# Patient Record
Sex: Female | Born: 1935 | Race: White | Hispanic: No | Smoking: Never smoker
Health system: Southern US, Community
[De-identification: ages and names within clinical notes are randomized; demographics above are authoritative.]

## PROBLEM LIST (undated history)

## (undated) DIAGNOSIS — N6019 Diffuse cystic mastopathy of unspecified breast: Secondary | ICD-10-CM

## (undated) DIAGNOSIS — I255 Ischemic cardiomyopathy: Secondary | ICD-10-CM

## (undated) DIAGNOSIS — Z860100 Personal history of colon polyps, unspecified: Secondary | ICD-10-CM

## (undated) DIAGNOSIS — I10 Essential (primary) hypertension: Secondary | ICD-10-CM

## (undated) DIAGNOSIS — I219 Acute myocardial infarction, unspecified: Secondary | ICD-10-CM

## (undated) DIAGNOSIS — I2102 ST elevation (STEMI) myocardial infarction involving left anterior descending coronary artery: Secondary | ICD-10-CM

## (undated) DIAGNOSIS — I2510 Atherosclerotic heart disease of native coronary artery without angina pectoris: Secondary | ICD-10-CM

## (undated) HISTORY — DX: Diffuse cystic mastopathy of unspecified breast: N60.19

## (undated) HISTORY — DX: Personal history of colonic polyps: Z86.010

## (undated) HISTORY — DX: Essential (primary) hypertension: I10

## (undated) HISTORY — DX: Acute myocardial infarction, unspecified: I21.9

## (undated) HISTORY — PX: CATARACT EXTRACTION, BILATERAL: SHX1313

## (undated) HISTORY — DX: Atherosclerotic heart disease of native coronary artery without angina pectoris: I25.10

## (undated) HISTORY — DX: Personal history of colon polyps, unspecified: Z86.0100

## (undated) HISTORY — DX: Ischemic cardiomyopathy: I25.5

## (undated) HISTORY — DX: ST elevation (STEMI) myocardial infarction involving left anterior descending coronary artery: I21.02

---

## 1998-10-21 ENCOUNTER — Other Ambulatory Visit: Admission: RE | Admit: 1998-10-21 | Discharge: 1998-10-21 | Payer: Self-pay | Admitting: Obstetrics and Gynecology

## 1999-09-19 ENCOUNTER — Other Ambulatory Visit: Admission: RE | Admit: 1999-09-19 | Discharge: 1999-09-19 | Payer: Self-pay | Admitting: Obstetrics and Gynecology

## 1999-10-28 ENCOUNTER — Ambulatory Visit (HOSPITAL_COMMUNITY): Admission: RE | Admit: 1999-10-28 | Discharge: 1999-10-28 | Payer: Self-pay | Admitting: General Surgery

## 1999-10-28 ENCOUNTER — Encounter: Payer: Self-pay | Admitting: General Surgery

## 2000-09-19 ENCOUNTER — Ambulatory Visit (HOSPITAL_COMMUNITY): Admission: RE | Admit: 2000-09-19 | Discharge: 2000-09-19 | Payer: Self-pay | Admitting: Obstetrics and Gynecology

## 2000-09-19 ENCOUNTER — Other Ambulatory Visit: Admission: RE | Admit: 2000-09-19 | Discharge: 2000-09-19 | Payer: Self-pay | Admitting: Obstetrics and Gynecology

## 2000-09-19 ENCOUNTER — Encounter: Payer: Self-pay | Admitting: Obstetrics and Gynecology

## 2002-02-11 ENCOUNTER — Other Ambulatory Visit: Admission: RE | Admit: 2002-02-11 | Discharge: 2002-02-11 | Payer: Self-pay | Admitting: Obstetrics and Gynecology

## 2009-12-13 ENCOUNTER — Ambulatory Visit (HOSPITAL_COMMUNITY): Admission: RE | Admit: 2009-12-13 | Discharge: 2009-12-13 | Payer: Self-pay | Admitting: Ophthalmology

## 2010-12-09 ENCOUNTER — Emergency Department (HOSPITAL_COMMUNITY)
Admission: EM | Admit: 2010-12-09 | Discharge: 2010-12-09 | Payer: Self-pay | Source: Home / Self Care | Admitting: Emergency Medicine

## 2010-12-09 ENCOUNTER — Inpatient Hospital Stay (HOSPITAL_COMMUNITY)
Admission: EM | Admit: 2010-12-09 | Discharge: 2010-12-13 | Disposition: A | Discharge status: Home / Self Care | Payer: Self-pay | Attending: Cardiovascular Disease | Admitting: Cardiovascular Disease

## 2010-12-09 LAB — CBC
HCT: 41 % (ref 36.0–46.0)
Hemoglobin: 15.3 g/dL — ABNORMAL HIGH (ref 12.0–15.0)
MCH: 33 pg (ref 26.0–34.0)
MCHC: 37.3 g/dL — ABNORMAL HIGH (ref 30.0–36.0)
MCV: 88.6 fL (ref 78.0–100.0)
Platelets: 232 10*3/uL (ref 150–400)
RBC: 4.63 MIL/uL (ref 3.87–5.11)
RDW: 14.3 % (ref 11.5–15.5)
WBC: 9.7 10*3/uL (ref 4.0–10.5)

## 2010-12-09 LAB — COMPREHENSIVE METABOLIC PANEL
ALT: 16 U/L (ref 0–35)
AST: 45 U/L — ABNORMAL HIGH (ref 0–37)
Albumin: 4 g/dL (ref 3.5–5.2)
Alkaline Phosphatase: 76 U/L (ref 39–117)
BUN: 8 mg/dL (ref 6–23)
CO2: 25 mEq/L (ref 19–32)
Calcium: 9.2 mg/dL (ref 8.4–10.5)
Chloride: 96 mEq/L (ref 96–112)
Creatinine, Ser: 0.63 mg/dL (ref 0.4–1.2)
GFR calc Af Amer: >60 mL/min (ref >60)
GFR calc non Af Amer: >60 mL/min (ref >60)
Glucose, Bld: 138 mg/dL — ABNORMAL HIGH (ref 70–99)
Potassium: 3.6 mEq/L (ref 3.5–5.1)
Sodium: 134 mEq/L — ABNORMAL LOW (ref 135–145)
Total Bilirubin: 0.5 mg/dL (ref 0.3–1.2)
Total Protein: 7.3 g/dL (ref 6.0–8.3)

## 2010-12-09 LAB — CARDIAC PANEL(CRET KIN+CKTOT+MB+TROPI)
CK, MB: 132.4 ng/mL (ref 0.3–4.0)
CK, MB: 156.1 ng/mL (ref 0.3–4.0)
Relative Index: 17.1 — ABNORMAL HIGH (ref 0.0–2.5)
Total CK: 662 U/L — ABNORMAL HIGH (ref 7–177)
Troponin I: 33.71 ng/mL (ref 0.00–0.06)

## 2010-12-09 LAB — POCT CARDIAC MARKERS
CKMB, poc: 25.2 ng/mL (ref 1.0–8.0)
CKMB, poc: 38.3 ng/mL (ref 1.0–8.0)
Myoglobin, poc: 282 ng/mL (ref 12–200)
Myoglobin, poc: 287 ng/mL (ref 12–200)
Troponin i, poc: 0.93 ng/mL (ref 0.00–0.09)
Troponin i, poc: 0.95 ng/mL (ref 0.00–0.09)

## 2010-12-09 LAB — PROTIME-INR: INR: 1.01 (ref 0.00–1.49)

## 2010-12-09 LAB — APTT: aPTT: 60 seconds — ABNORMAL HIGH (ref 24–37)

## 2010-12-09 LAB — GLUCOSE, CAPILLARY

## 2010-12-10 LAB — URINALYSIS, ROUTINE W REFLEX MICROSCOPIC
Bilirubin Urine: NEGATIVE
Ketones, ur: NEGATIVE mg/dL
Nitrite: NEGATIVE
Protein, ur: NEGATIVE mg/dL
Specific Gravity, Urine: 1.017 (ref 1.005–1.030)
Urobilinogen, UA: 0.2 mg/dL (ref 0.0–1.0)

## 2010-12-10 LAB — CARDIAC PANEL(CRET KIN+CKTOT+MB+TROPI)
CK, MB: 97.4 ng/mL (ref 0.3–4.0)
Relative Index: 11.9 — ABNORMAL HIGH (ref 0.0–2.5)
Total CK: 820 U/L — ABNORMAL HIGH (ref 7–177)
Troponin I: 24.48 ng/mL (ref 0.00–0.06)

## 2010-12-10 LAB — BASIC METABOLIC PANEL
BUN: 5 mg/dL — ABNORMAL LOW (ref 6–23)
CO2: 23 mEq/L (ref 19–32)
Calcium: 8.1 mg/dL — ABNORMAL LOW (ref 8.4–10.5)
Creatinine, Ser: 0.68 mg/dL (ref 0.4–1.2)
GFR calc Af Amer: >60 mL/min (ref >60)

## 2010-12-10 LAB — CBC
Hemoglobin: 14.1 g/dL (ref 12.0–15.0)
MCH: 31.3 pg (ref 26.0–34.0)
MCHC: 35.4 g/dL (ref 30.0–36.0)
MCV: 88.4 fL (ref 78.0–100.0)

## 2010-12-10 LAB — LIPID PANEL
LDL Cholesterol: 151 mg/dL — ABNORMAL HIGH (ref 0–99)
VLDL: 14 mg/dL (ref 0–40)

## 2010-12-11 LAB — BASIC METABOLIC PANEL
BUN: 15 mg/dL (ref 6–23)
CO2: 23 mEq/L (ref 19–32)
Chloride: 96 mEq/L (ref 96–112)
Glucose, Bld: 109 mg/dL — ABNORMAL HIGH (ref 70–99)
Potassium: 3.8 mEq/L (ref 3.5–5.1)
Sodium: 128 mEq/L — ABNORMAL LOW (ref 135–145)

## 2010-12-12 LAB — BASIC METABOLIC PANEL
BUN: 11 mg/dL (ref 6–23)
CO2: 26 mEq/L (ref 19–32)
Chloride: 99 mEq/L (ref 96–112)
Creatinine, Ser: 0.74 mg/dL (ref 0.4–1.2)
Potassium: 4.2 mEq/L (ref 3.5–5.1)

## 2010-12-12 NOTE — H&P (Signed)
Debra Doyle, Debra Doyle                 ACCOUNT NO.:  [id number]  MEDICAL RECORD NO.:  [id number]          PATIENT TYPE:  INP  LOCATION:  2907                         FACILITY:  [facility]  PHYSICIAN:  [name], MDDATE OF BIRTH:  [birth year]  DATE OF ADMISSION:  12/09/2010 DATE OF DISCHARGE:                             HISTORY & PHYSICAL   PRIMARY CARDIOLOGIST:  Previously seen by Dr. Ellamae Sia.  PRIMARY CARE PHYSICIAN:  Erasmo Downer, MD in [place].  PATIENT PROFILE:  A 77 year old female without prior cardiac history presents on transfer from [facility] with non-ST elevation MI.  PROBLEM LIST: 1. Non-ST segment elevation myocardial function. 2. Hypertension. 3. History of fibrocystic breast disease affecting the left breast. 4. History of colon polyp. 5. Status post bilateral cataract surgeries.  ALLERGIES:  No known drug allergies.  HISTORY OF PRESENT ILLNESS:  A 77 year old female without prior cardiac history who reports having intermittent indigestion symptoms over the past few months, although never with exertion.  She exercises 30 minutes daily without limitations and says that she actually feels better usually with exercise.  Yesterday morning, she awoke with mild substernal chest pressure without associated symptoms lasting a few hours and resolving spontaneously.  She attributed this to reflux. Sometime after dinner, she had recurrence of chest pressure again without associated symptoms.  However, at this time, it was persistent throughout the night making her quite uncomfortable.  She presented to [facility] ED this morning where her ECG showed diffuse minimal J-point ST elevation, perhaps more pronounced in V2 and V3.  with poor R-wave progression and also small inferior Qs.  The patient was treated with aspirin, 600 mg of Plavix, heparin, and IV nitroglycerin with some initial relief of discomfort.  Currently, she says pain is nearly nonexistent.   Her troponin is 1.05 with a CK of 316, MB of 32.4.  She is transferred to [facility] for further evaluation.  HOME MEDICATIONS:  The patient takes multiple herbs and vitamins but no prescribed medicines.  FAMILY HISTORY:  Mother died with history of coronary artery disease, stroke, and diabetes.  Father died of emphysema.  She has a sister who died of cancer, another brother died of CVA.  SOCIAL HISTORY:  She lives in [place] with her husband.  She is retired.  She never smoked.  Denies alcohol or drugs.  She rides stationary bike 30 minutes daily.  She tried to adhere to low-fat diet, and uses herbal medications but she cannot say which one.  REVIEW OF SYSTEMS:  Positive for chest pain as outlined in the HPI.  She is postmenopausal.  Otherwise, all systems reviewed negative.  She is a full code.  PHYSICAL EXAMINATION:  VITAL SIGNS:  She is afebrile at a temperature of 98.1, heart rate 89, respirations 21, blood pressure 153/95, pulse ox 100% on room air. GENERAL:  Pleasant white female in no acute distress.  Awake, alert, and oriented x3. PSYCHIATRIC:  Normal affect. HEENT:  Normal. NEUROLOGIC:  Grossly intact, nonfocal. SKIN:  Warm and dry without lesions or masses. NECK:  Supple without bruits or JVD. LUNGS:  Respirations regular and unlabored  with few crackles at the bases, otherwise clear to auscultation. CARDIAC:  Regular S1 and S2.  No S3, S4, or murmurs. ABDOMEN:  Round, soft, nontender, nondistended. EXTREMITIES:  Warm, dry, and pink.  No clubbing, cyanosis, or edema. Dorsalis pedis and posterior tibial, and radial pulses are 2+ and equal bilaterally.  She has normal Allen test.  Chest x-ray shows no acute findings.  EKG shows sinus rhythm, rate 85. She has approximately 0.5 or less millimeter diffuse ST-segment elevation with small Qs inferiorly and poor R-wave progression.  She has approximately 2-mm J-point elevation in leads V2 and 1-mm in V3. Hemoglobin 15.3,  hematocrit 41, WBC 9.7, platelets 232.  Sodium 134, potassium 3.6, chloride 96, CO2 of 25, BUN 8, creatinine 0.6, glucose 138, total bilirubin 0.5, alkaline phosphatase 76, AST 45, ALT 16, total protein 7.3, albumin 4, calcium 9.2, CK 360, MB 32.4, troponin I 1.05.  ASSESSMENT AND PLAN: 1. Non-ST segment elevation myocardial infarction.  The patient     presents with 24-hour history of ongoing chest pressure without     associated symptoms.  She is currently feeling much better and is     more comfortable, although still says she has about a 1/10 chest     discomfort.  Plan to admit and cycle cardiac markers.  Continue     heparin, IV nitroglycerin.  She loaded with 600 mg of Plavix as     well as aspirin.  We will add beta-blocker and high-dose statin.     Plan catheterization later today or sooner if necessary.  Titrate     nitrates for chest pain. 2. Hypertension adding beta-blocker and currently on IV nitro.     Continue to follow.  Would have a low threshold, add ACE inhibitor     in the setting of acute coronary syndrome. 3. Lipid status currently unknown.  Check lipids and LFTs.  Add high-     dose statin.  Thank you for following this patient.     Nicolasa Ducking, ANP   ______________________________ [name], MD    CB/MEDQ  D:  12/09/2010  T:  12/10/2010  Job:  [id number]  Electronically Signed by Nicolasa Ducking ANP on 12/12/2010 12:42:12 PM Electronically Signed by [name] MD on 12/12/2010 02:45:01 PM

## 2010-12-12 NOTE — Procedures (Signed)
Debra Doyle, PULVER                 ACCOUNT NO.:  [id number]  MEDICAL RECORD NO.:  [id number]           PATIENT TYPE:  LOCATION:                                 FACILITY:  PHYSICIAN:  [name], MDDATE OF BIRTH:  [birth year]  DATE OF PROCEDURE:  12/09/2010 DATE OF DISCHARGE:                           CARDIAC CATHETERIZATION   PRIMARY CARE PHYSICIAN:  Erasmo Downer, MD  PROCEDURE PERFORMED: 1. Left heart catheterization. 2. Selective coronary angiography. 3. Left ventricular angiogram. 4. PTCA with placement of 4 overlapping drug-eluting stents extending     from the proximal to the distal left anterior descending artery. 5. Angio-Seal femoral artery closure device.  OPERATOR:  [name], MD  INDICATIONS:  This is a 77 year old Caucasian female with a history of hypertension who is very active, but had the onset of chest discomfort yesterday.  The patient presented to [facility] Emergency Room this morning and was transferred to [facility] for further evaluation.  She ruled in for myocardial infarction with serial cardiac enzymes.  Her troponin peaked at 5.53 prior to the planned catheterization this evening.  PROCEDURE IN DETAIL:  The patient was brought to the main cardiac catheterization laboratory after signing informed consent for the procedure.  The right groin was prepped and draped in sterile fashion. A 1% lidocaine was used for local anesthesia.  A 5-French sheath was inserted into the right femoral artery without difficulty.  Standard diagnostic catheters were used to perform selective coronary angiography.  A pigtail catheter was used to perform a left ventricular angiogram.  The patient tolerated the diagnostic portion of the procedure well.  The patient was found to have a subtotal occlusion of the mid left anterior descending artery with a very large thrombus burden throughout the mid and distal vessel.  We up sized her sheath  to a 6-French sheath.  We then selectively engaged the left main artery with 6-French XB LAD 3.5 guiding catheter.  The patient was given a bolus of Angiomax and drip was started.  When the ACT was greater than 200, we passed a cougar intracoronary wire down the length of the left anterior descending artery.  There was a large thrombus burden.  I initially tried to pass an export catheter down the vessel, but we could not pass this beyond the severe stenosis in the mid vessel.  I then passed a 2.5 x 20-mm balloon into the mid vessel and dilated this lesion.  With this, some flow was restored in the mid vessel.  However, the distal vessel was still occluded.  The balloon was inflated 3 additional times extending from the distal vessel back through the mid vessel.  At this point, there was still some remaining thrombus in the vessel.  We gave the patient a double bolus of Integrilin and started an Integrilin drip.  I felt that in order to adequately treat her vessel, we would have to stent the entire left anterior descending artery.  We started by placing a 2.5 x 20-mm promise element drug-eluting stent in the distal vessel.  We then placed an overlapping 2.5 x 28-mm promise element  drug-eluting stent.  In the mid segment of 3.0 x 28-mm promise drug-eluting stent was placed.  In the proximal segment extending back to the ostium, a 3.0 x 20-mm promise element drug-eluting stent was placed.  We postdilated the distal portion of the stented segment with a 2.5 x 20-mm noncompliant balloon in high inflation pressures.  We postdilated the mid and proximal segments with a 3.25 x 20-mm noncompliant balloon with high pressure inflations.  The patient tolerated the procedure well.  We had good flow into the apical portion of the vessel at conclusion of the case.  There was some disease in the apical left anterior descending artery as it wrapped around the apex. However, the vessel was very  small-caliber down there.  The diagonal branches were small in size and had ostial 70% lesions.  There was flow into both diagonal branches at the conclusion of the case.  The patient's Angiomax was stopped here in the cath lab.  Her Integrilin drip was continued.  We placed an Angio-Seal femoral artery closure device in the right femoral artery.  The patient had no complaints of chest pain at the time of leaving the cath lab.  Of note, the patient was loaded with Plavix 600 mg earlier this morning.  The patient was taken to the CCU in stable condition.  HEMODYNAMIC FINDINGS:  Central aortic pressure 126/76.  Left ventricular pressure 121/6.  Left ventricular end-diastolic pressure 14.  ANGIOGRAPHIC FINDINGS: 1. The left main coronary artery had 10% distal plaque. 2. The left anterior descending was large vessel that coursed to the     apex.  There was a 99% subtotal occlusion in the midportion of the     vessel.  The distal portion of the vessel fills slowly with initial     injections.  However, there appeared to be a large thrombus burden     throughout the distal vessel.  The proximal portion of the LAD had     a 60% narrowing.  The diagonal branches were small in caliber and     had ostial 70% lesions. 3. The circumflex artery gave off a large obtuse marginal branch with     diffuse 50% stenosis.  The AV groove circumflex had plaque disease. 4. The right coronary artery is a large dominant vessel with 30%     proximal plaque.  A discrete 60% mid stenosis, diffuse plaque     throughout the distal vessel, and diffuse plaque throughout the     posterolateral branch and posterior descending artery. 5. Left ventricular angiogram was performed in the RAO projection     showed akinesis of the anterior wall and apex with ejection     fraction of 35%.  IMPRESSION: 1. Non-ST-elevation myocardial infarction secondary to subtotally     occluded mid left anterior descending artery. 2.  Triple-vessel coronary artery disease. 3. Successful PTCA with placement of 4 overlapping drug-eluting stents     in the proximal mid and distal left anterior descending artery.  RECOMMENDATIONS:  The patient should be continued on aspirin and Plavix for the remainder of her life.  We will continue Integrilin for the next 18 hours.  We will stop her nitroglycerin drip now.  She will be given intravenous Lopressor for blood pressure control.  She will be watched closely in the CCU.  We will plan on getting an echocardiogram in the a.m.     [name], MD     CM/MEDQ  D:  12/09/2010  T:  12/10/2010  Job:  [id number]  cc:   Erasmo Downer, MD  Electronically Signed by [name] MD on 12/12/2010 10:30:18 AM

## 2010-12-13 LAB — BASIC METABOLIC PANEL
CO2: 27 mEq/L (ref 19–32)
Calcium: 8.7 mg/dL (ref 8.4–10.5)
Chloride: 102 mEq/L (ref 96–112)
Creatinine, Ser: 0.78 mg/dL (ref 0.4–1.2)
Glucose, Bld: 103 mg/dL — ABNORMAL HIGH (ref 70–99)

## 2010-12-23 NOTE — Discharge Summary (Signed)
Debra Doyle, Debra Doyle                 ACCOUNT NO.:  [id number]  MEDICAL RECORD NO.:  [id number]          PATIENT TYPE:  INP  LOCATION:  3707                         FACILITY:  [facility]  PHYSICIAN:  Debra Doyle, MDDATE OF BIRTH:  29-Jan-1929  DATE OF ADMISSION:  12/09/2010 DATE OF DISCHARGE:  12/13/2010                              DISCHARGE SUMMARY   PRIMARY CARDIOLOGIST:  Jonelle Sidle, MD (new).  PRIMARY CARE PHYSICIAN:  Erasmo Downer, MD  DISCHARGE DIAGNOSES: 1. Coronary artery disease/non-ST-elevation myocardial infarction.     a.     Cardiac catheterization December 09, 2010:  Non-ST-elevation      myocardial infarction secondary to subtotally occluded mid left      anterior descending artery.  Triple-vessel coronary artery      disease.  Successful percutaneous transluminal coronary      angioplasty with placement of 4 overlapping drug-eluting stents      (2.5 x 20 mm Promus Element, 2.5 x 28 mm Promus Element, 3.0 x 28      mm Promus Element, 3.0 x 20 mm Promus Element drug-eluting stents)      in the proximal, mid, and distal left anterior descending artery.     b.     Peak cardiac enzymes on second full set with CK 912, MB      156.1, and troponin-I of 33.71. 2. Hypotension.     a.     Systolic blood pressure nadir of 83 limiting a beta blockade      and ACE inhibitor therapy. 3. Hyponatremia.     a.     Sodium nadir of 127, resolved by December 12, 2010.  The      patient instructed not to restrict sodium intake. 4. Ischemic cardiomyopathy, left ventricular ejection fraction 40-45%.     a.     2-D echocardiogram, December 11, 2010:  Left ventricular      ejection fraction 40-45% with hypokinesis to akinesis of the      mid/distal inferior, distal posterior, and mid distal septal      walls.  Hypokinesis/akinesis of the distal anterior, akinesis at      the apex.  Left ventricular function is vigorous proximally.  Wall      thickness with increasing pattern of  moderate left ventricular      hypertrophy, grade 1 diastolic dysfunction. 5. Hyperlipidemia.     a.     Total cholesterol 215, triglycerides 69, HDL 50, and LDL      151. 6. Hypertension.     a.     Peak blood pressure of 170/102, subsequently hypotensive      during the rest of hospital course.  SECONDARY DIAGNOSES: 1. History of fibrocystic breast disease affecting the left breast. 2. History of colonic polyps. 3. Status post  bilateral cataract surgeries.  ALLERGIES:  NKDA.  PROCEDURES: 1. Chest x-ray, December 09, 2010:  No active cardiopulmonary disease. 2. EKG, December 10, 2010:  Normal sinus rhythm, 85 bpm, approximately     0.5 or less millimeters of diffuse ST-segment elevation with small     Q-waves inferiorly and poor  R-wave progression.  Approximately 2 mm     J-point elevation in leads V2 and 1 mm in V3. 3. 2-D echocardiogram, December 11, 2010:  Please see discharge     diagnoses section under ischemic cardiomyopathy. 4. Cardiac catheterization, December 09, 2010:  Left main 10% distal     plaque.  Left anterior descending is a large vessel coursing to the     apex with 99% subtotal occlusion in the midportion, distal portion     fills slowly with initial injections.  Appeared to be a large     thrombus burden throughout the distal vessel.  Proximal portion of     left anterior descending had 60% narrowing.  Diagonal branches were     small in caliber with 70% ostial lesions.  Circumflex artery gave     off large obtuse marginal with diffuse 50% stenosis.     Atrioventricular circumflex had plaque disease.  Right coronary     artery is a large dominant vessel with 30% proximal plaque.  60%     discrete mid stenosis, diffuse plaque throughout the distal vessel     and diffuse plaque throughout the posterolateral branch and     posterior descending artery.  Left ventricular ejection fraction     35%.  Otherwise, see discharge diagnoses #1 under coronary artery      disease subsection A. 5. EKG, December 12, 2010:  Normal sinus rhythm, 86 bpm, biphasic T-     waves in V1-V6 as well as in inferior leads with minimal T-wave     inversion in I and aVL.  No clearly significant Q-waves with poor R-     wave progression, normal axis, PR 152, QRS in 98, and QTc of 476.  HISTORY OF PRESENT ILLNESS:  Debra Doyle is a 77 year old Caucasian female with no known cardiac history and no other risk factors other than noted who presented to [facility] complaining of symptoms consistent with GERD that were unrelenting over the course of the evening and prompted her to seek further evaluation.  Her EKG showed diffuse minimal J-point elevation, more pronounced in V2 and V3 with poor R-wave progression and her cardiac enzymes were noted to be up with a troponin of 1.05 on first set of enzymes documented.  The patient was treated with 600 mg Plavix load, IV heparin, IV nitro drip with improvement in her symptoms and subsequently transferred to [facility] for further evaluation.  HOSPITAL COURSE:  The patient was admitted and due to her concerning EKG changes and in spite of her minimal symptoms, it was thought best to proceed with urgent cardiac catheterization.  She was subsequently found to have significant triple-vessel coronary artery disease, most severe in the mid left anterior descending artery with 99% subtotal occlusion. She had decreased LVEF of 35% on that study as well.  She was treated with PTCA and subsequent PCI with drug-eluting stents x4 to the left anterior descending artery.  She initially was hypertensive with a peak blood pressure of 170/102 and was treated with p.r.n. hydralazine along with low-dose beta-blockade and ACE inhibitor therapy.  She subsequently became hypotensive with a systolic blood pressure nadir of 83. Secondary to this, her beta-blockade therapy and ACE inhibitor therapy were not up-titrated and she slowly increased her  blood pressures to within normal limits which had been stable for 24 hours at the time of her discharge.  She worked with Cardiac Rehab Phase I on December 12, 2010 ambulating 500 feet  with no symptoms nor any need for assistance. She was kept overnight for observation and again was asymptomatic on the morning of December 13, 2010.  She was seen by attending cardiologist, Dr. Verne Doyle and deemed stable for discharge.  Please see discharge meds in the discharge med section.  At the time of her discharge, the patient received her new medication list, prescriptions, followup instructions, and postcath instructions.  She has been set up for a followup at [facility] in [place] office as she lives in [place] with Dr. Nona Dell on December 29, 2010 at 3:20 p.m. It is also noteworthy that the patient had some mild hyponatremia with a sodium nadir of 127 which resolved with minimal increase in her p.o. salt intake and no other interventions.  Her sodium on date of discharge was 139.  The patient was instructed to not restrict sodium on an outpatient basis but to increase for fruits and vegetables as part of her heart-healthy diet going forward.  She has been set up for Cardiac Rehab Phase II by cardiac rehab inpatient.  DISCHARGE LABORATORY DATA:  WBC 7.8, HGB 14.1, HCT 39.8, and PLT count is 196.  Pro-time 13.5 and INR 1.01.  Sodium 139, potassium 4.1, chloride 102, bicarb 27, BUN 10, and creatinine 0.78.  Liver function tests on December 09, 2010 were within normal limits except for AST at 45.  Hemoglobin A1c was 5.3%.  Initial set of point-of-care markers positive with a troponin of 1.05.  Peak cardiac enzymes on the second full set with CK 912, MB 156.1, and troponin-I of 33.71 down trending on third set.  Total cholesterol 215, triglycerides 69, HDL 50, LDL 151, and total cholesterol/HDL ratio 4.3.  TSH 1.309.  Urinalysis within normal limits except for a trace  amount of blood.  FOLLOWUP PLANS AND APPOINTMENTS:  Please see hospital course.  DISCHARGE MEDICATIONS: 1. Atorvastatin 80 mg 1 tablet p.o. at bedtime. 2. Acetaminophen 325 mg 1-2 tablets p.o. q.4 h. p.r.n. 3. Enteric-coated aspirin 325 mg 1 tablet p.o. daily. 4. Carvedilol 3.125 mg p.o. b.i.d. 5. Clopidogrel 75 mg 1 tablet p.o. daily with meal. 6. Lisinopril 2.5 mg 1 tablet p.o. daily. 7. Sublingual nitroglycerin 0.4 mg q.5 minutes up to 3 doses p.r.n.     for chest discomfort. 8. Bilberry 1 tablet p.o. daily. 9. Calcium carbonate 1 tablet p.o. t.i.d. 10.Cinnamon 1 tablet p.o. daily. 11.DIM 1 tablet p.o. daily. 12.Fish oil 1 tablet p.o. at bedtime. 13.Flaxseed oil 1 tablespoon p.o. daily. 14.Glucosamine 1 tablet p.o. b.i.d. 15.Magnesium oxide 1 tablet p.o. b.i.d. 16.Multivitamin 1 tablet p.o. b.i.d. 17.Vitamin C 500 mg p.o. daily.  DURATION OF DISCHARGE ENCOUNTER INCLUDING PHYSICIAN TIME:  35 minutes.     Jarrett Ables, PAC   ______________________________ Debra Carrow, MD    MS/MEDQ  D:  12/13/2010  T:  12/14/2010  Job:  [id number]  cc:   Jonelle Sidle, MD Erasmo Downer, MD  Electronically Signed by Jarrett Ables PAC on 12/21/2010 03:00:15 PM Electronically Signed by Debra Carrow MD on 12/23/2010 12:53:35 PM

## 2010-12-29 ENCOUNTER — Ambulatory Visit (INDEPENDENT_AMBULATORY_CARE_PROVIDER_SITE_OTHER): Payer: MEDICARE | Admitting: Cardiology

## 2010-12-29 ENCOUNTER — Encounter: Payer: Self-pay | Admitting: Cardiology

## 2010-12-29 DIAGNOSIS — E782 Mixed hyperlipidemia: Secondary | ICD-10-CM | POA: Insufficient documentation

## 2010-12-29 DIAGNOSIS — I10 Essential (primary) hypertension: Secondary | ICD-10-CM | POA: Insufficient documentation

## 2010-12-29 DIAGNOSIS — I2510 Atherosclerotic heart disease of native coronary artery without angina pectoris: Secondary | ICD-10-CM | POA: Insufficient documentation

## 2010-12-29 DIAGNOSIS — I214 Non-ST elevation (NSTEMI) myocardial infarction: Secondary | ICD-10-CM | POA: Insufficient documentation

## 2010-12-30 ENCOUNTER — Encounter: Payer: Self-pay | Admitting: Cardiology

## 2011-01-04 NOTE — Letter (Signed)
Summary: [name], [employer] at [employer]  [address], [place] [address]   Phone: [phone]  Fax: [phone]     December 29, 2010 MRN: [id number]   Debra Doyle [address] [place], [place]  [address]      YOUR LAB WORK IS DUE  February 06, 2011 _________________________________________  Please go to [facility], located across the street from [facility] on the second floor.  Hours are Monday - Friday 7am until 7:30pm         Saturday 8am until 12noon    _X_  DO NOT EAT OR DRINK AFTER MIDNIGHT EVENING PRIOR TO LABWORK  __ YOUR LABWORK IS NOT FASTING --YOU MAY EAT PRIOR TO LABWORK

## 2011-01-04 NOTE — Assessment & Plan Note (Signed)
Summary: [facility] 4 STENTS   Visit Type:  Follow-up Primary Provider:  Dr. Lia Hopping   History of Present Illness: 76 year old woman presents for followup. This is my first meeting with her. She presented with non-ST elevation myocardial infarction in January, and underwent percutaneous intervention to the LAD by Dr. Worthy Keeler as noted below. She has evidence of an ischemic cardiomyopathy by followup echocardiogram.   She states that she is "tired" although continues to exercise at [employer], and plans to join cardiac rehabilitation. She is not reporting any angina. She does describe a dry cough, questioning whether it could be related to her medications.  Labs of note during hospital stay included cholesterol 215, triglycerides 69, HDL 50, LDL 151, peak troponin I of 33, BUN 10, creatinine 0.7. During her stay medical therapy was limited by transient hypotension, and she also developed transient hyponatremia. Sodium was 139 by discharge.  In speaking with the patient, and particularly her husband, it is quite clear that they adhere to alternative views of health and health care. Her husband in fact brought in books and other paraphernalia to support this. While I did certainly endorse the positive effects of diet and exercise, and also was fairly clear in explaining the indications for her medical therapy, and in particular the critical importance of maintaining dual antiplatelet therapy in light of her drug eluding stents.  Blood pressure readings from home were presented showing systolics generally ranging from 115 to 153.  Current Medications (verified): 1)  Atorvastatin Calcium 40 Mg Tabs (Atorvastatin Calcium) .... Take 1 Tablet By Mouth At Bedtime 2)  Tylenol 325 Mg Tabs (Acetaminophen) .... Take As Needed 3)  Aspirin 325 Mg Tabs (Aspirin) .... Take 1 Tab Daily 4)  Carvedilol 3.125 Mg Tabs (Carvedilol) .... Take 1 Tab Two Times A Day 5)  Plavix 75 Mg Tabs (Clopidogrel Bisulfate) .... Take 1  Tab Daily 6)  Cozaar 25 Mg Tabs (Losartan Potassium) .... Take 1 Tablet By Mouth Once Daily 7)  Nitrostat 0.4 Mg Subl (Nitroglycerin) .... Use As Directed For Chest Pain 8)  Bilberry 100 Mg Caps (Bilberry (Vaccinium Myrtillus)) .... Take 1 Tab Daily 9)  Calcarb 600 1500 Mg Tabs (Calcium Carbonate) .... Take 1 Tab Three Times A Day 10)  Cinnamon 500 Mg Caps (Cinnamon) .... Take 1 Tab Daily 11)  Dramamine 50 Mg Tabs (Dimenhydrinate) .... Take 1 Tab Daily 12)  Fish Oil 1000 Mg Caps (Omega-3 Fatty Acids) .... Take 1 Tab Daily 13)  Flax Seed Oil 1000 Mg Caps (Flaxseed (Linseed)) 14)  Glucosamine 500 Mg Caps (Glucosamine Sulfate) .... Take 1 Tab Two Times A Day 15)  Magnesium 300 Mg Caps (Magnesium) .... Take 1 Tab Two Times A Day 16)  Daily Multi  Tabs (Multiple Vitamins-Minerals) .... Take 1 Tab Daily 17)  Vitamin C Cr 500 Mg Cr-Caps (Ascorbic Acid) .... Take 1 Tab Daily  Allergies (verified): No Known Drug Allergies  Comments:  Nurse/Medical Assistant: pt brought med list and her husband and i went over meds [facility]  Past History:  Family History: Last updated: 01/25/2011 Father: deceased due to emphysemia Mother: deceased due to coronary artery disease 1 sister deceased due to cancer type not noted 1 brother deceased due to cva  Social History: Last updated: 2011-01-25 Retired  Married  Tobacco Use - No.  Alcohol Use - no Regular Exercise - yes  Past Medical History: CAD - DES x 4 to LAD 1/12, LVEF 40-45% Hypertension Myocardial Infarction - NSTEMI 1/12 Fibrocystic breast disease  Colonic polyps  Past Surgical History: Bilaterial cataract surgery  Family History: Father: deceased due to emphysemia Mother: deceased due to coronary artery disease 1 sister deceased due to cancer type not noted 1 brother deceased due to cva  Social History: Retired  Married  Tobacco Use - No.  Alcohol Use - no Regular Exercise - yes  Review of Systems  The patient denies  anorexia, fever, chest pain, syncope, dyspnea on exertion, peripheral edema, hemoptysis, melena, and hematochezia.         Otherwise reviewed and negative except as outlined.  Vital Signs:  Patient profile:   77 year old female Height:      64 inches Weight:      170 pounds BMI:     29.29 Pulse rate:   86 / minute BP sitting:   142 / 85  (left arm)  Vitals Entered By: Dreama Saa, CNA (December 29, 2010 3:22 PM)  Physical Exam  Additional Exam:  Overweight woman in no acute distress. HEENT: Conjunctiva and lids normal, oropharynx with moist mucosa. Neck: Supple, no elevated JVP or carotid bruits. Lungs: Clear to auscultation, nontender. Cardiac: Regular rate and rhythm, no S3 or pericardial rub. Abdomen: Soft, nontender, bowel sounds present. Skin: Warm and dry. Extremities: No pitting edema, distal pulses full. Skin: Warm and dry, no ecchymosis. Musculoskeletal: No kyphosis. Neuropsychiatric: Alert and oriented x3, affect appropriate.   Cardiac Cath  Procedure date:  12/12/2010  Findings:      ANGIOGRAPHIC FINDINGS:   1. The left main coronary artery had 10% distal plaque.   2. The left anterior descending was large vessel that coursed to the       apex.  There was a 99% subtotal occlusion in the midportion of the       vessel.  The distal portion of the vessel fills slowly with initial       injections.  However, there appeared to be a large thrombus burden       throughout the distal vessel.  The proximal portion of the LAD had       a 60% narrowing.  The diagonal branches were small in caliber and       had ostial 70% lesions.   3. The circumflex artery gave off a large obtuse marginal branch with       diffuse 50% stenosis.  The AV groove circumflex had plaque disease.   4. The right coronary artery is a large dominant vessel with 30%       proximal plaque.  A discrete 60% mid stenosis, diffuse plaque       throughout the distal vessel, and diffuse plaque  throughout the       posterolateral branch and posterior descending artery.   5. Left ventricular angiogram was performed in the RAO projection       showed akinesis of the anterior wall and apex with ejection       fraction of 35%.  Echocardiogram  Procedure date:  12/11/2010  Findings:      Study Conclusions   Left ventricle: LVEF is approximately 40 to 45% with hypokinesis to   akinesis of the mid/distal inferior,distal posterior, and mid;   distal septal walls; hypokinesis/akinesis of the distal anterior ;   akinesis at the apex. LV function is vigorous proximally Wall   thickness was increased in a pattern of moderate LVH. Doppler   parameters are consistent with abnormal left ventricular relaxation   (grade 1 diastolic dysfunction).  EKG  Procedure date:  12/29/2010  Findings:      Sinus rhythm at 76 beats per minute with evidence of recent anterolateral infarct, possibly also inferoapical with evolutionary changes.  Impression & Recommendations:  Problem # 1:  ACUT MI SUBENDOCARDIAL INFARCT SUBSQT EPIS CARE (ICD-410.72)  No active chest pain. Plans to investigate cardiac rehabilitation in [place]. As noted above, I discussed the importance and indications of medical therapy. Patient and her husband seem to endorse more of an alternative view however. Her general preference is to reduce and/or discontinue medications over time. I have explained the critical importance of dual antiplatelet therapy in light of her drug-eluting stent placement, and she says that she will comply with this. I hope that we can work together in managing her cardiac condition over time. Six-week visit is scheduled for followup.  Her updated medication list for this problem includes:    Aspirin 325 Mg Tabs (Aspirin) .Marland Kitchen... Take 1 tab daily    Carvedilol 3.125 Mg Tabs (Carvedilol) .Marland Kitchen... Take 1 tab two times a day    Plavix 75 Mg Tabs (Clopidogrel bisulfate) .Marland Kitchen... Take 1 tab daily    Nitrostat 0.4 Mg Subl  (Nitroglycerin) ..... Use as directed for chest pain  Problem # 2:  CORONARY ATHEROSCLEROSIS NATIVE CORONARY ARTERY (ICD-414.01)  Cardiac catheterization review above, status post placement of 4 overlapping drug-eluting stents in the left anterior descending by Dr. Sanjuana Kava.  Her updated medication list for this problem includes:    Aspirin 325 Mg Tabs (Aspirin) .Marland Kitchen... Take 1 tab daily    Carvedilol 3.125 Mg Tabs (Carvedilol) .Marland Kitchen... Take 1 tab two times a day    Plavix 75 Mg Tabs (Clopidogrel bisulfate) .Marland Kitchen... Take 1 tab daily    Nitrostat 0.4 Mg Subl (Nitroglycerin) ..... Use as directed for chest pain  Problem # 3:  CARDIOMYOPATHY, ISCHEMIC (ICD-414.8)  LVEF 40-45%, hopefully some degree of stunning rather than scar. Continuing medical therapy is indicated. Patient does indicate a dry cough, possibly related to lisinopril which is a new medication. We will switch to Cozaar.  Her updated medication list for this problem includes:    Aspirin 325 Mg Tabs (Aspirin) .Marland Kitchen... Take 1 tab daily    Carvedilol 3.125 Mg Tabs (Carvedilol) .Marland Kitchen... Take 1 tab two times a day    Plavix 75 Mg Tabs (Clopidogrel bisulfate) .Marland Kitchen... Take 1 tab daily    Cozaar 25 Mg Tabs (Losartan potassium) .Marland Kitchen... Take 1 tablet by mouth once daily    Nitrostat 0.4 Mg Subl (Nitroglycerin) ..... Use as directed for chest pain  Problem # 4:  HYPERLIPIDEMIA (ICD-272.4)  Agreed to reduce dose of Lipitor 40 mg daily. Followup fasting lipid profile with liver function tests around the time of her next visit. I still plan to aim for aggressive LDL control.  Her updated medication list for this problem includes:    Atorvastatin Calcium 40 Mg Tabs (Atorvastatin calcium) .Marland Kitchen... Take 1 tablet by mouth at bedtime  Future Orders: T-Lipid Profile ([id number]) ... 02/06/2011 T-Hepatic Function [id number]) ... 02/06/2011  Problem # 5:  ESSENTIAL HYPERTENSION, BENIGN (ICD-401.1)  Blood pressure needs to be followed. They will continue to  check it at home.  Her updated medication list for this problem includes:    Aspirin 325 Mg Tabs (Aspirin) .Marland Kitchen... Take 1 tab daily    Carvedilol 3.125 Mg Tabs (Carvedilol) .Marland Kitchen... Take 1 tab two times a day    Cozaar 25 Mg Tabs (Losartan potassium) .Marland Kitchen... Take 1 tablet by mouth once daily  Patient Instructions:  1)  Your physician recommends that you schedule a follow-up appointment in: 6 weeks 2)  Your physician recommends that you return for lab work in: 6 weeks, just before next office visit 3)  Your physician has recommended you make the following change in your medication: decrease Lipitor to 40mg  and change Lisinopril to Cozaar 25mg  by mouth once daily  Prescriptions: COZAAR 25 MG TABS (LOSARTAN POTASSIUM) take 1 tablet by mouth once daily  #30 x 3   Entered by:   Larita Fife Via LPN   Authorized by:   Loreli Slot, MD, [facility]   Signed by:   Larita Fife Via LPN on 16/08/9603   Method used:   Electronically to        [facility]  [address] [address] [id number] (retail)       [address]       [place], [place]  [address]       Ph: [phone] or [phone]       Fax: [phone]   RxID:   [id number]

## 2011-02-06 ENCOUNTER — Other Ambulatory Visit: Payer: Self-pay | Admitting: Cardiology

## 2011-02-06 LAB — HEPATIC FUNCTION PANEL
Albumin: 4.4 g/dL (ref 3.5–5.2)
Alkaline Phosphatase: 86 U/L (ref 39–117)
Total Bilirubin: 0.5 mg/dL (ref 0.3–1.2)
Total Protein: 7.5 g/dL (ref 6.0–8.3)

## 2011-02-06 LAB — LIPID PANEL
HDL: 47 mg/dL (ref >39)
LDL Cholesterol: 165 mg/dL — ABNORMAL HIGH (ref 0–99)
Triglycerides: 176 mg/dL — ABNORMAL HIGH (ref <150)
VLDL: 35 mg/dL (ref 0–40)

## 2011-02-09 ENCOUNTER — Encounter: Payer: Self-pay | Admitting: *Deleted

## 2011-02-09 ENCOUNTER — Encounter: Payer: Self-pay | Admitting: Cardiology

## 2011-02-10 ENCOUNTER — Ambulatory Visit (INDEPENDENT_AMBULATORY_CARE_PROVIDER_SITE_OTHER): Payer: MEDICARE | Admitting: Cardiology

## 2011-02-10 ENCOUNTER — Encounter: Payer: Self-pay | Admitting: Cardiology

## 2011-02-10 VITALS — BP 154/104 | HR 92 | Ht 63.0 in | Wt 170.0 lb

## 2011-02-10 DIAGNOSIS — I2510 Atherosclerotic heart disease of native coronary artery without angina pectoris: Secondary | ICD-10-CM

## 2011-02-10 DIAGNOSIS — I214 Non-ST elevation (NSTEMI) myocardial infarction: Secondary | ICD-10-CM

## 2011-02-10 DIAGNOSIS — I2589 Other forms of chronic ischemic heart disease: Secondary | ICD-10-CM

## 2011-02-10 DIAGNOSIS — I429 Cardiomyopathy, unspecified: Secondary | ICD-10-CM

## 2011-02-10 DIAGNOSIS — Z79899 Other long term (current) drug therapy: Secondary | ICD-10-CM

## 2011-02-10 DIAGNOSIS — I10 Essential (primary) hypertension: Secondary | ICD-10-CM

## 2011-02-10 DIAGNOSIS — E782 Mixed hyperlipidemia: Secondary | ICD-10-CM

## 2011-02-10 MED ORDER — LOSARTAN POTASSIUM 50 MG PO TABS: 50.0000 mg | ORAL_TABLET | Freq: Every day | ORAL | Status: DC

## 2011-02-10 NOTE — Assessment & Plan Note (Addendum)
No reported angina at this point. Patient has multivessel CAD based on cardiac catheterization from January, in the setting of an acute coronary syndrome with 99% subtotal occlusion of the left anterior descending at the midportion. 50% circumflex and 60% RCA stenoses also noted with other mild diffuse disease. I continue to recommend aggressive intervention with medical therapy, also diet and exercise. At least at this point, the patient indicates that she wishes to continue to see me for followup.

## 2011-02-10 NOTE — Assessment & Plan Note (Signed)
Copies of original lipid profile during hospitalization with followup numbers were provided today. I made it clear that her LDL was not at goal given her situation. I did encourage them to continue with efforts at diet and exercise, although additionally indicated that it is likely she would need higher dose Lipitor, perhaps a change in statin therapy to get her LDL closer to 70. They (husband in particular) were not comfortable with making any changes at this time, and therefore she will continue on Lipitor 40 mg daily at least at this point.

## 2011-02-10 NOTE — Assessment & Plan Note (Signed)
Status post placement of four drug-eluting stents to the left anterior descending in January by Dr. Clifton James in the setting of a non-ST elevation myocardial infarction. Peak troponin I level was 33. I continue to underscore the critical importance of continuing dual antiplatelet therapy. Patient states that she is compliant. No reported bleeding problems.

## 2011-02-10 NOTE — Assessment & Plan Note (Signed)
Blood pressure does not appear to be well controlled. While she could have some degree of white coat hypertension, reporting that blood pressure is lower at other times, I still think that advancing medical therapy makes the most sense, particularly in light of her CAD with evidence of cardiomyopathy. I have recommended increasing Cozaar to 50 mg daily. We will continue to review her medications as long as she follows with [place].

## 2011-02-10 NOTE — Patient Instructions (Addendum)
Increase Cozaar to 50mg  by mouth once daily. You may take 2 of your 25 mg tablets until gone and then get new prescription filled for 50mg  tablets. Your physician recommends that you go to the [facility] for lab work: [facility]-DO LAB IN 2 WEEKS. Follow up as scheduled in 6 weeks.

## 2011-02-10 NOTE — Progress Notes (Signed)
Clinical Summary [name] is a 77 y.o.female recently established with me at an office visit in [place] back in February. History is reviewed below. She is now transitioning her care to the [place] office.  At our initial visit, it was clear that the patient and her husband adhered to fairly alternative views of health care and management strategies for illness. We discussed these viewpoints, and also her clear indications for more traditional medical therapy in light of drug-eluting stent placement and her recent acute coronary syndrome with evidence of LV dysfunction. I reviewed this again today.  We did change her from ACE inhibitor therapy to Cozaar related to dry cough at last visit. She reports no major change as yet. One wonders if it is clearly related. I outlined her active indications for ACE inhibitor or ARB treatment.  She denies any angina, reports that she is exercising 4-5 days a week at [employer], but was not interested in cardiac rehabilitation.  Followup lab work from 26 March showed total cholesterol 247, triglycerides 176, HDL 47, LDL 165, AST 23, ALT 12. I discussed these results in comparison to the prior lipid profile around the time of her hospitalization. She reports compliance with Lipitor.  We discussed options for medication adjustments in light of her blood pressure, need for afterload reduction, better control of lipids, recommendations for exercise. We also discussed plan to followup an echocardiogram eventually for reassessment of LVEF. The patient and her husband still seem fairly resistant to considering the possibility of long term medications, particularly at possibly higher doses.  The patient's husband was actually confrontational today and insulting to me personally, with use of vulgarities. He tells me that they plan to visit with Debra Doyle for his management opinion, and state that she used to see him years ago. I suggested that they should consider finding  another cardiologist if they were not comfortable with my recommendations - an option that the husband seemed to enbrace with vigor - however the patient herself stated that she wanted to continue to see me for evaluation at this point. I am not convinced however that a reasonable physician-patient relationship will be able to continue longterm at this point.  No Known Allergies  Current outpatient prescriptions:acetaminophen (TYLENOL) 325 MG tablet, Take 650 mg by mouth every 6 (six) hours as needed.  , Disp: , Rfl: ;  Ascorbic Acid (VITAMIN C) 500 MG tablet, Take 500 mg by mouth daily.  , Disp: , Rfl: ;  aspirin 325 MG tablet, Take 325 mg by mouth daily.  , Disp: , Rfl: ;  atorvastatin (LIPITOR) 40 MG tablet, Take 40 mg by mouth daily.  , Disp: , Rfl: ;  Bilberry 100 MG CAPS, Take by mouth daily.  , Disp: , Rfl:  Calcium Carbonate (CALCARB 600) 1500 MG TABS, Take by mouth 3 (three) times daily.  , Disp: , Rfl: ;  carvedilol (COREG) 3.125 MG tablet, Take 3.125 mg by mouth 2 (two) times daily with a meal.  , Disp: , Rfl: ;  Cinnamon 500 MG capsule, Take 500 mg by mouth daily.  , Disp: , Rfl: ;  clopidogrel (PLAVIX) 75 MG tablet, Take 75 mg by mouth daily.  , Disp: , Rfl: ;  Flaxseed, Linseed, (FLAXSEED OIL) 1000 MG CAPS, Take by mouth daily.  , Disp: , Rfl:  Glucosamine 500 MG CAPS, Take by mouth 2 (two) times daily.  , Disp: , Rfl: ;  losartan (COZAAR) 50 MG tablet, Take 1 tablet (50 mg total)  by mouth daily., Disp: 30 tablet, Rfl: 6;  Magnesium 300 MG CAPS, Take by mouth 2 (two) times daily.  , Disp: , Rfl: ;  nitroGLYCERIN (NITROSTAT) 0.4 MG SL tablet, Place 0.4 mg under the tongue every 5 (five) minutes as needed.  , Disp: , Rfl:  Omega-3 Fatty Acids (FISH OIL) 1000 MG CAPS, Take by mouth daily.  , Disp: , Rfl: ;  dimenhyDRINATE (DRAMAMINE) 50 MG tablet, Take 50 mg by mouth daily.  , Disp: , Rfl: ;  Multiple Vitamin (MULTIVITAMIN) tablet, Take 1 tablet by mouth daily.  , Disp: , Rfl: ;  DISCONTD: losartan  (COZAAR) 25 MG tablet, Take 25 mg by mouth daily.  , Disp: , Rfl:  DISCONTD: losartan (COZAAR) 50 MG tablet, Take 1 tablet (50 mg total) by mouth daily., Disp: 30 tablet, Rfl: 6;  DISCONTD: losartan (COZAAR) 50 MG tablet, Take 1 tablet (50 mg total) by mouth daily., Disp: 30 tablet, Rfl: 6  Past Medical History  Diagnosis Date  . Coronary atherosclerosis of native coronary artery     DES x 4  to LAD 1/12, LVEF 40-45%  . Essential hypertension, benign   . Myocardial infarction     NSTEMI 1/12  . Fibrocystic breast disease   . Hx of colonic polyps     Social History [name] reports that she has never smoked. She has never used smokeless tobacco. [name] reports that she does not drink alcohol.  Review of Systems No fevers, chills, or unusual weight change. Some fatigue. No chest pain, progressive shortness of breath, cough, hemoptysis, or wheezing. No palpitations, dizziness, or syncope. No dysphasia or odynophagia. Stable appetite with no abdominal pain, melena, or hematochezia. No orthopnea, PND, or lower extremity edema. No focal motor weakness, memory problems, or speech deficits. Otherwise systems reviewed and negative except as already outlined.   Physical Examination Filed Vitals:   02/10/11 1414  BP: 154/104  Pulse: 92  Overweight woman in no acute distress. HEENT: Conjunctiva and lids normal, oropharynx with moist mucosa. Neck: Supple, no elevated JVP or carotid bruits. Lungs: Clear to auscultation, nontender. Cardiac: Regular rate and rhythm, no S3 or pericardial rub. Abdomen: Soft, nontender, bowel sounds present. Skin: Warm and dry. Extremities: No pitting edema, distal pulses full. Skin: Warm and dry, no ecchymosis. Musculoskeletal: No kyphosis. Neuropsychiatric: Alert and oriented x3, affect appropriate.    Problem List and Plan

## 2011-02-10 NOTE — Assessment & Plan Note (Signed)
Presumably ischemic cardiomyopathy, although there may have been a component of myocardial stunning at the time of her acute coronary event. Medical therapy should continue, and I again recommend a followup echocardiogram. Perhaps they will agree to further evaluation at next visit.

## 2011-03-24 ENCOUNTER — Ambulatory Visit: Payer: MEDICARE | Admitting: Cardiology

## 2011-04-03 ENCOUNTER — Ambulatory Visit (HOSPITAL_COMMUNITY)
Admission: RE | Admit: 2011-04-03 | Discharge: 2011-04-03 | Disposition: A | Discharge status: Home / Self Care | Payer: Medicare Other | Source: Ambulatory Visit | Attending: Ophthalmology | Admitting: Ophthalmology

## 2011-04-03 DIAGNOSIS — Z79899 Other long term (current) drug therapy: Secondary | ICD-10-CM | POA: Insufficient documentation

## 2011-04-03 DIAGNOSIS — H26499 Other secondary cataract, unspecified eye: Secondary | ICD-10-CM | POA: Insufficient documentation

## 2011-04-03 DIAGNOSIS — I10 Essential (primary) hypertension: Secondary | ICD-10-CM | POA: Insufficient documentation

## 2013-11-25 ENCOUNTER — Emergency Department (HOSPITAL_COMMUNITY): Payer: Medicare Other

## 2013-11-25 ENCOUNTER — Encounter (HOSPITAL_COMMUNITY)
Admission: EM | Disposition: A | Discharge status: Home / Self Care | Payer: Medicare Other | Source: Home / Self Care | Attending: Interventional Cardiology

## 2013-11-25 ENCOUNTER — Encounter (HOSPITAL_COMMUNITY): Payer: Self-pay | Admitting: Emergency Medicine

## 2013-11-25 ENCOUNTER — Inpatient Hospital Stay (HOSPITAL_COMMUNITY)
Admission: EM | Admit: 2013-11-25 | Discharge: 2013-11-29 | DRG: 250 | Disposition: A | Discharge status: Home / Self Care | Payer: Medicare Other | Attending: Interventional Cardiology | Admitting: Interventional Cardiology

## 2013-11-25 DIAGNOSIS — I214 Non-ST elevation (NSTEMI) myocardial infarction: Secondary | ICD-10-CM

## 2013-11-25 DIAGNOSIS — T82897A Other specified complication of cardiac prosthetic devices, implants and grafts, initial encounter: Secondary | ICD-10-CM | POA: Diagnosis present

## 2013-11-25 DIAGNOSIS — K5900 Constipation, unspecified: Secondary | ICD-10-CM | POA: Diagnosis not present

## 2013-11-25 DIAGNOSIS — E782 Mixed hyperlipidemia: Secondary | ICD-10-CM | POA: Diagnosis present

## 2013-11-25 DIAGNOSIS — I509 Heart failure, unspecified: Secondary | ICD-10-CM | POA: Diagnosis present

## 2013-11-25 DIAGNOSIS — I2582 Chronic total occlusion of coronary artery: Secondary | ICD-10-CM | POA: Diagnosis present

## 2013-11-25 DIAGNOSIS — Z9861 Coronary angioplasty status: Secondary | ICD-10-CM

## 2013-11-25 DIAGNOSIS — N6019 Diffuse cystic mastopathy of unspecified breast: Secondary | ICD-10-CM | POA: Diagnosis present

## 2013-11-25 DIAGNOSIS — I10 Essential (primary) hypertension: Secondary | ICD-10-CM | POA: Diagnosis present

## 2013-11-25 DIAGNOSIS — Z8249 Family history of ischemic heart disease and other diseases of the circulatory system: Secondary | ICD-10-CM

## 2013-11-25 DIAGNOSIS — I2109 ST elevation (STEMI) myocardial infarction involving other coronary artery of anterior wall: Secondary | ICD-10-CM

## 2013-11-25 DIAGNOSIS — Y849 Medical procedure, unspecified as the cause of abnormal reaction of the patient, or of later complication, without mention of misadventure at the time of the procedure: Secondary | ICD-10-CM | POA: Diagnosis present

## 2013-11-25 DIAGNOSIS — I2102 ST elevation (STEMI) myocardial infarction involving left anterior descending coronary artery: Secondary | ICD-10-CM

## 2013-11-25 DIAGNOSIS — I4949 Other premature depolarization: Secondary | ICD-10-CM | POA: Diagnosis present

## 2013-11-25 DIAGNOSIS — Z7982 Long term (current) use of aspirin: Secondary | ICD-10-CM

## 2013-11-25 DIAGNOSIS — I5023 Acute on chronic systolic (congestive) heart failure: Secondary | ICD-10-CM | POA: Diagnosis present

## 2013-11-25 DIAGNOSIS — I2510 Atherosclerotic heart disease of native coronary artery without angina pectoris: Secondary | ICD-10-CM

## 2013-11-25 DIAGNOSIS — Z823 Family history of stroke: Secondary | ICD-10-CM

## 2013-11-25 DIAGNOSIS — I213 ST elevation (STEMI) myocardial infarction of unspecified site: Secondary | ICD-10-CM

## 2013-11-25 DIAGNOSIS — I2589 Other forms of chronic ischemic heart disease: Secondary | ICD-10-CM | POA: Diagnosis present

## 2013-11-25 DIAGNOSIS — Z79899 Other long term (current) drug therapy: Secondary | ICD-10-CM

## 2013-11-25 DIAGNOSIS — Z860100 Personal history of colon polyps, unspecified: Secondary | ICD-10-CM

## 2013-11-25 DIAGNOSIS — I252 Old myocardial infarction: Secondary | ICD-10-CM

## 2013-11-25 DIAGNOSIS — Z91199 Patient's noncompliance with other medical treatment and regimen due to unspecified reason: Secondary | ICD-10-CM

## 2013-11-25 DIAGNOSIS — Z9119 Patient's noncompliance with other medical treatment and regimen: Secondary | ICD-10-CM

## 2013-11-25 HISTORY — PX: LEFT HEART CATHETERIZATION WITH CORONARY ANGIOGRAM: SHX5451

## 2013-11-25 HISTORY — PX: PERCUTANEOUS CORONARY STENT INTERVENTION (PCI-S): SHX5485

## 2013-11-25 LAB — CBC WITH DIFFERENTIAL/PLATELET
Basophils Absolute: 0 10*3/uL (ref 0.0–0.1)
Basophils Absolute: 0 10*3/uL (ref 0.0–0.1)
Basophils Relative: 0 % (ref 0–1)
Basophils Relative: 0 % (ref 0–1)
Eosinophils Absolute: 0.1 10*3/uL (ref 0.0–0.7)
Eosinophils Absolute: 0 10*3/uL (ref 0.0–0.7)
Eosinophils Relative: 1 % (ref 0–5)
Eosinophils Relative: 0 % (ref 0–5)
HCT: 40.7 % (ref 36.0–46.0)
HEMATOCRIT: 47.2 % — AB (ref 36.0–46.0)
HEMOGLOBIN: 17.5 g/dL — AB (ref 12.0–15.0)
HEMOGLOBIN: 15 g/dL (ref 12.0–15.0)
LYMPHS ABS: 1.2 10*3/uL (ref 0.7–4.0)
LYMPHS ABS: 0.9 10*3/uL (ref 0.7–4.0)
LYMPHS PCT: 18 % (ref 12–46)
Lymphocytes Relative: 8 % — ABNORMAL LOW (ref 12–46)
MCH: 34.7 pg — ABNORMAL HIGH (ref 26.0–34.0)
MCH: 33.9 pg (ref 26.0–34.0)
MCHC: 37.1 g/dL — ABNORMAL HIGH (ref 30.0–36.0)
MCHC: 36.9 g/dL — ABNORMAL HIGH (ref 30.0–36.0)
MCV: 93.7 fL (ref 78.0–100.0)
MCV: 92.1 fL (ref 78.0–100.0)
MONO ABS: 0.3 10*3/uL (ref 0.1–1.0)
MONO ABS: 0.4 10*3/uL (ref 0.1–1.0)
MONOS PCT: 4 % (ref 3–12)
MONOS PCT: 4 % (ref 3–12)
NEUTROS ABS: 5.2 10*3/uL (ref 1.7–7.7)
NEUTROS ABS: 9.4 10*3/uL — AB (ref 1.7–7.7)
Neutrophils Relative %: 78 % — ABNORMAL HIGH (ref 43–77)
Neutrophils Relative %: 88 % — ABNORMAL HIGH (ref 43–77)
Platelets: 229 10*3/uL (ref 150–400)
Platelets: 180 10*3/uL (ref 150–400)
RBC: 5.04 MIL/uL (ref 3.87–5.11)
RBC: 4.42 MIL/uL (ref 3.87–5.11)
RDW: 13.9 % (ref 11.5–15.5)
RDW: 13.7 % (ref 11.5–15.5)
WBC: 6.7 10*3/uL (ref 4.0–10.5)
WBC: 10.7 10*3/uL — ABNORMAL HIGH (ref 4.0–10.5)

## 2013-11-25 LAB — COMPREHENSIVE METABOLIC PANEL
ALK PHOS: 77 U/L (ref 39–117)
ALT: 16 U/L (ref 0–35)
AST: 61 U/L — ABNORMAL HIGH (ref 0–37)
Albumin: 3.2 g/dL — ABNORMAL LOW (ref 3.5–5.2)
BILIRUBIN TOTAL: 0.4 mg/dL (ref 0.3–1.2)
BUN: 10 mg/dL (ref 6–23)
CHLORIDE: 93 meq/L — AB (ref 96–112)
CO2: 24 meq/L (ref 19–32)
Calcium: 8.4 mg/dL (ref 8.4–10.5)
Creatinine, Ser: 0.63 mg/dL (ref 0.50–1.10)
GFR calc non Af Amer: 81 mL/min — ABNORMAL LOW (ref >90)
Glucose, Bld: 126 mg/dL — ABNORMAL HIGH (ref 70–99)
Potassium: 4.3 mEq/L (ref 3.7–5.3)
SODIUM: 128 meq/L — AB (ref 137–147)
Total Protein: 6.5 g/dL (ref 6.0–8.3)

## 2013-11-25 LAB — BASIC METABOLIC PANEL
BUN: 11 mg/dL (ref 6–23)
CALCIUM: 9.5 mg/dL (ref 8.4–10.5)
CHLORIDE: 92 meq/L — AB (ref 96–112)
CO2: 25 mEq/L (ref 19–32)
CREATININE: 0.68 mg/dL (ref 0.50–1.10)
GFR, EST NON AFRICAN AMERICAN: 79 mL/min — AB (ref >90)
Glucose, Bld: 168 mg/dL — ABNORMAL HIGH (ref 70–99)
Potassium: 4.1 mEq/L (ref 3.7–5.3)
Sodium: 131 mEq/L — ABNORMAL LOW (ref 137–147)

## 2013-11-25 LAB — PROTIME-INR
INR: 2.1 — ABNORMAL HIGH (ref 0.00–1.49)
Prothrombin Time: 22.9 seconds — ABNORMAL HIGH (ref 11.6–15.2)

## 2013-11-25 LAB — TROPONIN I
TROPONIN I: 0.41 ng/mL — AB (ref <0.30)
Troponin I: 8.47 ng/mL (ref <0.30)

## 2013-11-25 LAB — TSH: TSH: 2.121 u[IU]/mL (ref 0.350–4.500)

## 2013-11-25 LAB — POCT I-STAT 3, ART BLOOD GAS (G3+)
Acid-base deficit: 3 mmol/L — ABNORMAL HIGH (ref 0.0–2.0)
Bicarbonate: 21.1 mEq/L (ref 20.0–24.0)
O2 SAT: 95 %
PCO2 ART: 35.8 mmHg (ref 35.0–45.0)
TCO2: 22 mmol/L (ref 0–100)
pH, Arterial: 7.379 (ref 7.350–7.450)
pO2, Arterial: 76 mmHg — ABNORMAL LOW (ref 80.0–100.0)

## 2013-11-25 LAB — POCT I-STAT, CHEM 8
BUN: 9 mg/dL (ref 6–23)
CALCIUM ION: 1.18 mmol/L (ref 1.13–1.30)
Chloride: 95 mEq/L — ABNORMAL LOW (ref 96–112)
Creatinine, Ser: 0.6 mg/dL (ref 0.50–1.10)
Glucose, Bld: 182 mg/dL — ABNORMAL HIGH (ref 70–99)
HCT: 47 % — ABNORMAL HIGH (ref 36.0–46.0)
HEMOGLOBIN: 16 g/dL — AB (ref 12.0–15.0)
Potassium: 3.7 mEq/L (ref 3.7–5.3)
SODIUM: 131 meq/L — AB (ref 137–147)
TCO2: 20 mmol/L (ref 0–100)

## 2013-11-25 LAB — POCT ACTIVATED CLOTTING TIME: Activated Clotting Time: 437 seconds

## 2013-11-25 LAB — HEMOGLOBIN A1C
Hgb A1c MFr Bld: 5 % (ref <5.7)
Mean Plasma Glucose: 97 mg/dL (ref <117)

## 2013-11-25 LAB — PRO B NATRIURETIC PEPTIDE: Pro B Natriuretic peptide (BNP): 3737 pg/mL — ABNORMAL HIGH (ref 0–450)

## 2013-11-25 LAB — APTT: APTT: 172 s — AB (ref 24–37)

## 2013-11-25 LAB — MRSA PCR SCREENING: MRSA by PCR: NEGATIVE

## 2013-11-25 LAB — MAGNESIUM: MAGNESIUM: 1.9 mg/dL (ref 1.5–2.5)

## 2013-11-25 SURGERY — LEFT HEART CATHETERIZATION WITH CORONARY ANGIOGRAM
Anesthesia: LOCAL

## 2013-11-25 MED ORDER — SODIUM CHLORIDE 0.9 % IV SOLN
0.2500 mg/kg/h | INTRAVENOUS | Status: AC
  Administered 2013-11-25: 0.25 mg/kg/h via INTRAVENOUS
  Filled 2013-11-25: qty 250

## 2013-11-25 MED ORDER — TICAGRELOR 90 MG PO TABS
90.0000 mg | ORAL_TABLET | Freq: Two times a day (BID) | ORAL | Status: DC
  Administered 2013-11-25 – 2013-11-26 (×2): 90 mg via ORAL
  Filled 2013-11-25 (×4): qty 1

## 2013-11-25 MED ORDER — NITROGLYCERIN IN D5W 200-5 MCG/ML-% IV SOLN
INTRAVENOUS | Status: AC
  Filled 2013-11-25: qty 250

## 2013-11-25 MED ORDER — ALPRAZOLAM 0.25 MG PO TABS: 0.2500 mg | ORAL_TABLET | Freq: Two times a day (BID) | ORAL | Status: DC | PRN

## 2013-11-25 MED ORDER — NITROGLYCERIN IN D5W 200-5 MCG/ML-% IV SOLN: 2.0000 ug/min | INTRAVENOUS | Status: AC

## 2013-11-25 MED ORDER — SODIUM CHLORIDE 0.9 % IV SOLN
INTRAVENOUS | Status: DC
  Administered 2013-11-25: 17:00:00 via INTRAVENOUS

## 2013-11-25 MED ORDER — ADULT MULTIVITAMIN W/MINERALS CH
1.0000 | ORAL_TABLET | Freq: Every day | ORAL | Status: DC
  Administered 2013-11-26 – 2013-11-29 (×4): 1 via ORAL
  Filled 2013-11-25 (×4): qty 1

## 2013-11-25 MED ORDER — HEPARIN (PORCINE) IN NACL 100-0.45 UNIT/ML-% IJ SOLN
INTRAMUSCULAR | Status: AC
  Filled 2013-11-25: qty 250

## 2013-11-25 MED ORDER — ASPIRIN 300 MG RE SUPP
300.0000 mg | RECTAL | Status: AC
  Filled 2013-11-25: qty 1

## 2013-11-25 MED ORDER — ASPIRIN 81 MG PO CHEW
CHEWABLE_TABLET | ORAL | Status: AC
  Filled 2013-11-25: qty 4

## 2013-11-25 MED ORDER — NITROGLYCERIN 0.4 MG SL SUBL
0.4000 mg | SUBLINGUAL_TABLET | SUBLINGUAL | Status: DC | PRN
  Administered 2013-11-25: 0.4 mg via SUBLINGUAL

## 2013-11-25 MED ORDER — NITROGLYCERIN 0.4 MG SL SUBL: 0.4000 mg | SUBLINGUAL_TABLET | SUBLINGUAL | Status: DC | PRN

## 2013-11-25 MED ORDER — METOPROLOL TARTRATE 12.5 MG HALF TABLET
12.5000 mg | ORAL_TABLET | Freq: Two times a day (BID) | ORAL | Status: DC
  Administered 2013-11-25 – 2013-11-27 (×5): 12.5 mg via ORAL
  Filled 2013-11-25 (×7): qty 1

## 2013-11-25 MED ORDER — ZOLPIDEM TARTRATE 5 MG PO TABS: 5.0000 mg | ORAL_TABLET | Freq: Every evening | ORAL | Status: DC | PRN

## 2013-11-25 MED ORDER — NITROGLYCERIN 0.4 MG SL SUBL
0.4000 mg | SUBLINGUAL_TABLET | SUBLINGUAL | Status: DC | PRN
  Filled 2013-11-25: qty 25

## 2013-11-25 MED ORDER — ONDANSETRON HCL 4 MG/2ML IJ SOLN
4.0000 mg | Freq: Four times a day (QID) | INTRAMUSCULAR | Status: DC | PRN
Start: 2013-11-25 — End: 2013-11-29
  Filled 2013-11-25: qty 2

## 2013-11-25 MED ORDER — ACETAMINOPHEN 325 MG PO TABS
650.0000 mg | ORAL_TABLET | ORAL | Status: DC | PRN
  Filled 2013-11-25: qty 2

## 2013-11-25 MED ORDER — SODIUM CHLORIDE 0.9 % IV SOLN: INTRAVENOUS | Status: DC

## 2013-11-25 MED ORDER — ASPIRIN 81 MG PO CHEW
81.0000 mg | CHEWABLE_TABLET | Freq: Every day | ORAL | Status: DC
  Administered 2013-11-26 – 2013-11-29 (×4): 81 mg via ORAL
  Filled 2013-11-25 (×4): qty 1

## 2013-11-25 MED ORDER — ATORVASTATIN CALCIUM 40 MG PO TABS
40.0000 mg | ORAL_TABLET | Freq: Every day | ORAL | Status: DC
  Administered 2013-11-25 – 2013-11-28 (×4): 40 mg via ORAL
  Filled 2013-11-25 (×5): qty 1

## 2013-11-25 MED ORDER — ASPIRIN 81 MG PO CHEW
324.0000 mg | CHEWABLE_TABLET | Freq: Once | ORAL | Status: AC
  Administered 2013-11-25: 324 mg via ORAL

## 2013-11-25 MED ORDER — OMEGA-3-ACID ETHYL ESTERS 1 G PO CAPS
1.0000 g | ORAL_CAPSULE | Freq: Every day | ORAL | Status: DC
  Administered 2013-11-26 – 2013-11-29 (×4): 1 g via ORAL
  Filled 2013-11-25 (×4): qty 1

## 2013-11-25 MED ORDER — ASPIRIN 81 MG PO CHEW: 324.0000 mg | CHEWABLE_TABLET | ORAL | Status: AC

## 2013-11-25 MED ORDER — ONDANSETRON HCL 4 MG/2ML IJ SOLN
4.0000 mg | Freq: Four times a day (QID) | INTRAMUSCULAR | Status: DC | PRN
  Administered 2013-11-26: 4 mg via INTRAVENOUS

## 2013-11-25 NOTE — CV Procedure (Addendum)
Left Heart Catheterization with Coronary Angiography with Emergency PCI Report  Debra Doyle  80 y.o.  female 11/19/1928  Procedure Date:11/25/2013  Referring Physician: [facility] Primary Cardiologist:: Johnny Bridge, M.D.  INDICATIONS: Acute anterior ST elevation myocardial infarction  PROCEDURE: 1. Emergency left heart catheterization; 2. Selective coronary angiography; 3. Left ventriculography; 4. PTCA and thrombectomy of the LAD  CONSENT:  The risks, benefits, and details of the procedure were explained in detail to the patient. Risks including death, stroke, heart attack, kidney injury, allergy, limb ischemia, bleeding and radiation injury were discussed.  The patient verbalized understanding and wanted to proceed.  Informed written consent was obtained.  PROCEDURE TECHNIQUE:  After Xylocaine anesthesia a 5 French slender sheath was placed in the right radial artery with a single anterior needle wall stick.  Coronary angiography was done using a JR 4 and EBU 3 cm (guide) catheter.  Left ventriculography was done using the JR 4 diagnostic catheter and hand injection.   The diagnostic images revealed moderately severe mid right coronary disease and thrombotic occlusion proximally in the overlapping ostial to distal LAD stents. This finding correlates with the patient's history that she discontinued dual antiplatelet therapy within the past 6 months.   We proceeded with percutaneous coronary intervention. The 6 [place] EBU 3 cm guide catheter was used to perform the procedure. A [place] wire was easily advanced to the distal LAD. Because of suspected thrombus, a Priority 1 aspiration catheter was advanced throughout the entire stented region. One "vacuuming" run was made with this catheter over approximately 20 seconds. Red serpentine thrombus was obtained. Following the thrombectomy run TIMI grade 3 flow was reestablished and a relatively focal proximal restenotic lesion was noted.  The restenosis occurred at a site of overlap between the proximal and mid stent. We performed angioplasty using a 20 mm long by 3 cm balloon catheter. We then upgraded to a 3.5 x 20 mm [place] balloon and dilated x2 up to 15 atmospheres. A very nice angiographic result was obtained. Rather than place additional metal in this region that already has a double air of stent, we decided to complete the case with only PTCA. TIMI grade 3 flow was noted. Symptoms completely resolved. The 3.5 mm balloon diameter was greater than the post dilatation balloon diameter (3.25 mm) at the index procedure in 2012.  Following the last angiogram, the guide catheter was removed. Hemostasis was achieved with a wrist band.  MEDICATIONS:  Versed 1 mg IV and 50 mcg of fentanyl.   EQUIPMENT:  Pro-water guidewire; Priority 1 thrombectomy catheter; 3.0 x 20 and 3.5 x 20 () balloons.  CONTRAST:  Total of  100 cc.  COMPLICATIONS:  None   HEMODYNAMICS:  Aortic pressure  105/65 mmHg; LV pressure  126/7 mmHg; LVEDP  14 mm mercury  ANGIOGRAPHIC DATA:   The left main coronary artery is  widely patent with distal 20% eccentric narrowing.  The left anterior descending artery is  totally occluded proximally within the first 6 cms of the ostial to distal LAD overlapping stents.  The left circumflex artery is  patent. The first obtuse marginal is the dominant vessel in this territory. There is proximal 50% narrowing. The vessel is large and trifurcates on the lateral wall. The continuation of the circumflex is widely patent and gives left atrial recurrent branches.  The right coronary artery is  patent. The vessel arises slightly anterior to the usual takeoff. There is 50-70% proximal stenosis. There is eccentric  50-70% mid vessel stenosis. PDA and 2 large left ventricular branches are noted distally. No significant obstruction is noted in the distal branches.  PCI RESULTS:  Total proximal occlusion of the LAD within the first 6 cm of  the proximal stent. Less than 10% stenosis noted after thrombectomy and aggressive PTCA. Additional stenting was not performed.  LEFT VENTRICULOGRAM:  Left ventricular angiogram was done in the 30 RAO projection and revealed  proximal to the apical LAD akinesis/dyskinesis. LVEF 20%.   IMPRESSIONS:  1. LAD in-stent restenosis with acute thrombosis in the setting of dual antiplatelet therapy discontinuation of proximally 6 months ago.  2. Successful catheter thrombectomy and angioplasty of the LAD proximal segment with return of TIMI grade 3 flow and less than 10% stenosis. No additional new stents were placed. Postdilatation was aggressive and to a larger diameter than the previous procedure.  3. Severe left ventricular dysfunction due to anterior wall motion abnormality from prior infarction aggravated by the current event with reduction and LVEF from 35% (2012) to 20% currently (acute).   RECOMMENDATION:   1. Aspirin and Brilinta for 12 months. 2. Initiation of heart failure therapy to include low-dose beta blocker and angiotensin rennin blockade, plus/minus diuretic therapy. Watch for evidence of heart failure 3. Wean and DC nitroglycerin over the next 12 hours 4. Continue low-dose IV Angiomax at the 0.25 mg per kilogram per minute rate for 6 hours. 5. Assuming a stable clinical course, she may be eligible for discharge in 48-72 hours 6. Will need to be considered for AICD placement if LV systolic function remains less than 35% greater than 40 days after this event all maximal medical therapy. Marland Kitchen

## 2013-11-25 NOTE — ED Notes (Signed)
CRITICAL VALUE ALERT  Critical value received:  troponin  Date of notification: 11/25/2013  Time of notification:  1632  Critical value read back:yes  Nurse who received alert:  L. Arlana Pouch RN  MD notified (1st page):  Dr. Stark Jock  Time of first page:    MD notified (2nd page):  Time of second page:  Responding MD: Dr. Stark Jock  Time MD responded:  1630

## 2013-11-25 NOTE — ED Provider Notes (Signed)
CSN: [id number]     Arrival date & time 11/25/13  1325 History   This chart was scribed for Veryl Speak, MD, by Neta Ehlers, ED Scribe. This patient was seen in room APA19/APA19 and the patient's care was started at 1:53 PM.  First MD Initiated Contact with Patient 11/25/13 1349     Chief Complaint  Patient presents with  . Chest Pain     The history is provided by the patient. No language interpreter was used.   HPI Comments: Debra Doyle is a 80 y.o. Female, with a h/o MI and HTN, who presents to the Emergency Department complaining of non-radiating, central-chest pain the pt characterizes as "pressure" which began yesterday and worsened today. The pt states the pressure is constant, and she rates the pain as 2 or 3 out of 10. She reports when the pain began yesterday, she drank soda and subsequently belched, which seemed to improve the symptoms. However, the symptoms returned today, and they were not improved with soda. She denies SOB, nausea, and diaphoreses. The pt had a MI three years ago and she has several stents; she has not had recent follow-ups with her cardiologist. She reports she has not had any difficulties until this episode. The pt has nitroglycerine, but she does not utilize it. She normally takes an ASA a day. She is a non-smoker.   Past Medical History  Diagnosis Date  . Coronary atherosclerosis of native coronary artery     DES x 4  to LAD 1/12, LVEF 40-45%  . Essential hypertension, benign   . Myocardial infarction     NSTEMI 1/12  . Fibrocystic breast disease   . Hx of colonic polyps    Past Surgical History  Procedure Laterality Date  . Cataract extraction, bilateral      Bilateral  . Coronary stent placement     Family History  Problem Relation Age of Onset  . Coronary artery disease Mother   . Emphysema Father   . Cancer Sister     1 sister/cancer type unknown  . Stroke Brother     1 brother   History  Substance Use Topics  . Smoking status:  Never Smoker   . Smokeless tobacco: Never Used  . Alcohol Use: No   No OB history provided.  Review of Systems  Cardiovascular: Positive for chest pain.  All other systems reviewed and are negative.   Allergies  Review of patient's allergies indicates no known allergies.  Home Medications   Current Outpatient Rx  Name  Route  Sig  Dispense  Refill  . acetaminophen (TYLENOL) 325 MG tablet   Oral   Take 650 mg by mouth every 6 (six) hours as needed.           . Ascorbic Acid (VITAMIN C) 500 MG tablet   Oral   Take 500 mg by mouth daily.           Marland Kitchen aspirin 325 MG tablet   Oral   Take 325 mg by mouth daily.           Marland Kitchen atorvastatin (LIPITOR) 40 MG tablet   Oral   Take 40 mg by mouth daily.           . Bilberry 100 MG CAPS   Oral   Take by mouth daily.           . Calcium Carbonate (CALCARB 600) 1500 MG TABS   Oral   Take by mouth 3 (three)  times daily.           . carvedilol (COREG) 3.125 MG tablet   Oral   Take 3.125 mg by mouth 2 (two) times daily with a meal.           . Cinnamon 500 MG capsule   Oral   Take 500 mg by mouth daily.           . clopidogrel (PLAVIX) 75 MG tablet   Oral   Take 75 mg by mouth daily.           Marland Kitchen dimenhyDRINATE (DRAMAMINE) 50 MG tablet   Oral   Take 50 mg by mouth daily.           . Flaxseed, Linseed, (FLAXSEED OIL) 1000 MG CAPS   Oral   Take by mouth daily.           . Glucosamine 500 MG CAPS   Oral   Take by mouth 2 (two) times daily.           Marland Kitchen EXPIRED: losartan (COZAAR) 50 MG tablet   Oral   Take 1 tablet (50 mg total) by mouth daily.   30 tablet   6   . Magnesium 300 MG CAPS   Oral   Take by mouth 2 (two) times daily.           . Multiple Vitamin (MULTIVITAMIN) tablet   Oral   Take 1 tablet by mouth daily.           . nitroGLYCERIN (NITROSTAT) 0.4 MG SL tablet   Sublingual   Place 0.4 mg under the tongue every 5 (five) minutes as needed.           . Omega-3 Fatty Acids  (FISH OIL) 1000 MG CAPS   Oral   Take by mouth daily.            Triage VitalsBP 182/113  Pulse 102  Temp(Src) 97.6 F (36.4 C) (Oral)  Resp 18  Ht 5\' 4"  (1.626 m)  Wt 152 lb (68.947 kg)  BMI 26.08 kg/m2  SpO2 99%  Physical Exam  Nursing note and vitals reviewed. Constitutional: She is oriented to person, place, and time. She appears well-nourished. No distress.  HENT:  Head: Normocephalic and atraumatic.  Eyes: EOM are normal.  Neck: Neck supple. No tracheal deviation present.  Cardiovascular: Normal rate, normal heart sounds and intact distal pulses.   No murmur heard. Pulmonary/Chest: Effort normal. No respiratory distress. She has no wheezes. She has no rales.  Abdominal: Soft. There is no tenderness. There is no rebound and no guarding.  Musculoskeletal: Normal range of motion.  Neurological: She is alert and oriented to person, place, and time.  Skin: Skin is warm and dry.  Psychiatric: She has a normal mood and affect. Her behavior is normal.    ED Course  Procedures (including critical care time)  DIAGNOSTIC STUDIES: Oxygen Saturation is 99% on room air, normal by my interpretation.    COORDINATION OF CARE:  2:02 PM- Discussed treatment plan with patient, and the patient agreed to the plan.   2:05 PM- Consulted concerning STEMI.   2:31 PM- Rechecked pt and explained that pt was being transferred.   Labs Review Labs Reviewed  CBC WITH DIFFERENTIAL  TROPONIN I  BASIC METABOLIC PANEL   Imaging Review Dg Chest Portable 1 View  11/25/2013   CLINICAL DATA:  Chest pain and shortness of breath.  EXAM: PORTABLE CHEST - 1 VIEW  COMPARISON:  Plain film of the chest 12/09/2010.  FINDINGS: There is cardiomegaly and pulmonary edema. No consolidative process or pneumothorax. No pleural effusion.  IMPRESSION: Cardiomegaly and pulmonary edema.   Electronically Signed   By: Inge Rise M.D.   On: 11/25/2013 14:03    EKG Interpretation    Date/Time:  Tuesday  November 25 2013 13:33:06 EST Ventricular Rate:  104 PR Interval:  174 QRS Duration: 128 QT Interval:  350 QTC Calculation: 460 R Axis:   -7 Text Interpretation:  Sinus tachycardia with occasional Premature ventricular complexes Possible Left atrial enlargement Left bundle branch block Abnormal ECG When compared with ECG of 12-Dec-2010 04:39, Premature ventricular complexes are now Present Left bundle branch block is now Present Borderline criteria for Anterior infarct are no longer Present ST Elevation V4, V5 Possible Acute MI Confirmed by DELOS  MD, Shenika Quint ([id number]) on 11/25/2013 2:44:12 PM            MDM  No diagnosis found. Patient is an 80 year old female with history of coronary artery disease status post MI approximately 3 years ago with stent placement. It sounds as though she has been lost to followup and has been noncompliant with her medications. She presents today with approximately a one-hour history of pressure in the center of her chest with no radiation, diaphoresis, but does feel somewhat short of breath.  Initial EKG performed upon arrival was reviewed by Dr. Lacinda Axon and a left bundle branch block was noted. There were ST changes present in V4and V5 When I evaluated the patient and reviewed the EKG, I was concerned about the findings. She had ST segment elevation in 2 leads concordant with the QRS in V4and V5and ST segment elevations discordant to the QRS in V2 and V3, possibly meeting Sgarbossa criteria.   I immediately contacted the cardiologist who was on for STEMI call. Apparently Dr. Tamala Julian was busy in the cath lab in this EKG was faxed. Approximately 15 minutes went by and I did not hear back from them. At about the same time the transport team showed up in the ER to take [facility] to [facility] for intervention. Apparently the code STEMI had been called by the cath lab team and [facility] was sent here to take the patient. She has been given aspirin, nitroglycerin, oxygen. She is also been  given heparin and will be started on a nitroglycerin drip. She will then be taken as a code STEMI to [facility] for further workup and evaluation.  CRITICAL CARE Performed by: Veryl Speak Total critical care time: 30 minutes.    Critical care time was exclusive of separately billable procedures and treating other patients. Critical care was necessary to treat or prevent imminent or life-threatening deterioration. Critical care was time spent personally by me on the following activities: development of treatment plan with patient and/or surrogate as well as nursing, discussions with consultants, evaluation of patient's response to treatment, examination of patient, obtaining history from patient or surrogate, ordering and performing treatments and interventions, ordering and review of laboratory studies, ordering and review of radiographic studies, pulse oximetry and re-evaluation of patient's condition.     I personally performed the services described in this documentation, which was scribed in my presence. The recorded information has been reviewed and is accurate.     Veryl Speak, MD 11/25/13 1450

## 2013-11-25 NOTE — ED Notes (Signed)
Pt c/o mid chest pressure first time yesterday and went away, reoccurred today after eating breakfast

## 2013-11-25 NOTE — ED Notes (Signed)
At about 1350 [facility] was called by Annalissa  To call whoever was on call for Stemi.

## 2013-11-25 NOTE — H&P (Signed)
Debra Doyle is an 80 y.o. female.    Primary Cardiologist:Dr. Domenic Polite PCP:  [name], MD  Chief Complaint: chest pain sone SOB  HPI: 80 year old WMF with hx of Acute coronary syndrome in 11/2010 with DES stents X 4 to LAD, EF 40-45% at that time.  Pt no longer on cardiac meds only vitamins and an occ ASA.  She stopped her Plavix in the past, > 6 months ago.  Last seen in the office 01/2011, was to follow with Dr. Rayford Halsted for a more organic management style.    She had been doing well until yesterday, developed indigestion feeling that lingered but eventually went away.  This indigestion type discomfort returned today assoc. With SOB and would not leave.  Rated the pain as constant today 2-3/10.  No nausea or diaphoresis she seemed SOB. She had ST elevation on EKG Ant. Lat leads.  Was seen at [facility] now here at [facility] on cath table for emergent cath.  She has rec'd ASA 325, IV NTG and IV Heparin.  She has no hx of Stroke or GI Bleed.  Past Medical History  Diagnosis Date  . Coronary atherosclerosis of native coronary artery     DES x 4  to LAD 1/12, LVEF 40-45%  . Essential hypertension, benign   . Myocardial infarction     NSTEMI 1/12  . Fibrocystic breast disease   . Hx of colonic polyps     Past Surgical History  Procedure Laterality Date  . Cataract extraction, bilateral      Bilateral  . Coronary stent placement      Family History  Problem Relation Age of Onset  . Coronary artery disease Mother   . Emphysema Father   . Cancer Sister     1 sister/cancer type unknown  . Stroke Brother     1 brother   Social History:  reports that she has never smoked. She has never used smokeless tobacco. She reports that she does not drink alcohol or use illicit drugs.  Allergies: No Known Allergies  Medications Prior to Admission  Medication Sig Dispense Refill  . Ascorbic Acid (VITAMIN C) 500 MG tablet Take 500 mg by mouth daily.        . Bilberry 100 MG CAPS  Take by mouth daily.        . Cinnamon 500 MG capsule Take 500 mg by mouth daily.        . Flaxseed, Linseed, (FLAXSEED OIL) 1000 MG CAPS Take by mouth daily.        . Glucosamine 500 MG CAPS Take by mouth 2 (two) times daily.        . Magnesium 300 MG CAPS Take by mouth 2 (two) times daily.        . Multiple Vitamin (MULTIVITAMIN) tablet Take 1 tablet by mouth daily.        . Omega-3 Fatty Acids (FISH OIL) 1000 MG CAPS Take by mouth daily.        Marland Kitchen aspirin 325 MG tablet Take 325 mg by mouth daily.        . nitroGLYCERIN (NITROSTAT) 0.4 MG SL tablet Place 0.4 mg under the tongue every 5 (five) minutes as needed.         Though in Cath lab she stated she only took ASA at times.  No results found for this or any previous visit (from the past 48 hour(s)).  Labs pending Dg Chest  Portable 1 View  11/25/2013   CLINICAL DATA:  Chest pain and shortness of breath.  EXAM: PORTABLE CHEST - 1 VIEW  COMPARISON:  Plain film of the chest 12/09/2010.  FINDINGS: There is cardiomegaly and pulmonary edema. No consolidative process or pneumothorax. No pleural effusion.  IMPRESSION: Cardiomegaly and pulmonary edema.   Electronically Signed   By: Inge Rise M.D.   On: 11/25/2013 14:03    ROS: General:no colds or fevers, no weight changes Skin:no rashes or ulcers HEENT:no blurred vision, no congestion CV:see HPI PUL:see HPI GI:no diarrhea constipation or melena, no indigestion GU:no hematuria, no dysuria MS:no joint pain, no claudication Neuro:no syncope, no lightheadedness Endo:no diabetes, no thyroid disease   Blood pressure 121/89, pulse 70, temperature 97.6 F (36.4 C), temperature source Oral, resp. rate 19, height 5\' 4"  (1.626 m), weight 152 lb (68.947 kg), SpO2 93.00%. PE: General:Pleasant affect, NAD Skin:Warm and dry, brisk capillary refill HEENT:normocephalic, sclera clear, mucus membranes moist Neck:supple, no JVD, no bruits  Heart:S1S2 RRR without murmur, gallup, rub or  click Lungs:clear without rales, rhonchi, or wheezes GQQ:PYPP, non tender, + BS, do not palpate liver spleen or masses Ext:no lower ext edema, 2+ pedal pulses, 2+ radial pulses Neuro:alert and oriented, MAE, follows commands, + facial symmetry    Assessment/Plan Principal Problem:   ST elevation myocardial infarction (STEMI) of anterior wall, 11/25/2013 Active Problems:   Mixed hyperlipidemia   Essential hypertension, benign   CORONARY ATHEROSCLEROSIS NATIVE CORONARY ARTERY, hx 4 DES stents to LAD 11/2010   CARDIOMYOPATHY, ISCHEMIC, EF 40-45% 2012  PLAN: Emergent cardiac cath for ant wall STEMI, most likely closure of LAD stents.  Further plan per MD post procedure.  [facility] Practitioner Certified [place] Pager [phone] or after 5pm or weekends call [phone] 11/25/2013, 3:14 PM

## 2013-11-25 NOTE — ED Notes (Signed)
MD at bedside. 

## 2013-11-26 DIAGNOSIS — I059 Rheumatic mitral valve disease, unspecified: Secondary | ICD-10-CM

## 2013-11-26 DIAGNOSIS — I219 Acute myocardial infarction, unspecified: Secondary | ICD-10-CM

## 2013-11-26 LAB — BASIC METABOLIC PANEL
BUN: 9 mg/dL (ref 6–23)
BUN: 8 mg/dL (ref 6–23)
CALCIUM: 8.3 mg/dL — AB (ref 8.4–10.5)
CO2: 20 mEq/L (ref 19–32)
CO2: 20 meq/L (ref 19–32)
CREATININE: 0.53 mg/dL (ref 0.50–1.10)
Calcium: 8.2 mg/dL — ABNORMAL LOW (ref 8.4–10.5)
Chloride: 95 mEq/L — ABNORMAL LOW (ref 96–112)
Chloride: 96 mEq/L (ref 96–112)
Creatinine, Ser: 0.5 mg/dL (ref 0.50–1.10)
GFR calc Af Amer: >90 mL/min (ref >90)
GFR calc Af Amer: >90 mL/min (ref >90)
GFR calc non Af Amer: 86 mL/min — ABNORMAL LOW (ref >90)
GFR, EST NON AFRICAN AMERICAN: 88 mL/min — AB (ref >90)
GLUCOSE: 111 mg/dL — AB (ref 70–99)
GLUCOSE: 131 mg/dL — AB (ref 70–99)
Potassium: 3.8 mEq/L (ref 3.7–5.3)
Potassium: 3.9 mEq/L (ref 3.7–5.3)
Sodium: 127 mEq/L — ABNORMAL LOW (ref 137–147)
Sodium: 132 mEq/L — ABNORMAL LOW (ref 137–147)

## 2013-11-26 LAB — LIPID PANEL
Cholesterol: 176 mg/dL (ref 0–200)
HDL: 42 mg/dL (ref >39)
LDL CALC: 114 mg/dL — AB (ref 0–99)
TRIGLYCERIDES: 99 mg/dL (ref <150)
Total CHOL/HDL Ratio: 4.2 RATIO
VLDL: 20 mg/dL (ref 0–40)

## 2013-11-26 LAB — MAGNESIUM: Magnesium: 1.7 mg/dL (ref 1.5–2.5)

## 2013-11-26 LAB — CBC
HEMATOCRIT: 35.3 % — AB (ref 36.0–46.0)
HEMOGLOBIN: 12.9 g/dL (ref 12.0–15.0)
MCH: 33.2 pg (ref 26.0–34.0)
MCHC: 36.5 g/dL — ABNORMAL HIGH (ref 30.0–36.0)
MCV: 90.7 fL (ref 78.0–100.0)
Platelets: 165 10*3/uL (ref 150–400)
RBC: 3.89 MIL/uL (ref 3.87–5.11)
RDW: 13.5 % (ref 11.5–15.5)
WBC: 6.5 10*3/uL (ref 4.0–10.5)

## 2013-11-26 LAB — TROPONIN I: Troponin I: >20 ng/mL (ref <0.30)

## 2013-11-26 MED ORDER — FUROSEMIDE 10 MG/ML IJ SOLN
INTRAMUSCULAR | Status: AC
  Administered 2013-11-26: 12:00:00 20 mg via INTRAVENOUS
  Filled 2013-11-26: qty 4

## 2013-11-26 MED ORDER — TICAGRELOR 90 MG PO TABS
90.0000 mg | ORAL_TABLET | Freq: Two times a day (BID) | ORAL | Status: DC
  Administered 2013-11-26 – 2013-11-29 (×6): 90 mg via ORAL
  Filled 2013-11-26 (×8): qty 1

## 2013-11-26 MED ORDER — LOSARTAN POTASSIUM 50 MG PO TABS
50.0000 mg | ORAL_TABLET | Freq: Every day | ORAL | Status: DC
  Administered 2013-11-26 – 2013-11-29 (×4): 50 mg via ORAL
  Filled 2013-11-26 (×4): qty 1

## 2013-11-26 MED ORDER — FUROSEMIDE 10 MG/ML IJ SOLN
20.0000 mg | Freq: Once | INTRAMUSCULAR | Status: AC
  Administered 2013-11-26: 20 mg via INTRAVENOUS

## 2013-11-26 MED FILL — Fentanyl Citrate Inj 0.05 MG/ML: INTRAMUSCULAR | Qty: 2 | Status: AC

## 2013-11-26 MED FILL — Midazolam HCl Inj 2 MG/2ML (Base Equivalent): INTRAMUSCULAR | Qty: 2 | Status: AC

## 2013-11-26 MED FILL — Verapamil HCl IV Soln 2.5 MG/ML: INTRAVENOUS | Qty: 2 | Status: AC

## 2013-11-26 MED FILL — Heparin Sodium (Porcine) 2 Unit/ML in Sodium Chloride 0.9%: INTRAMUSCULAR | Qty: 1000 | Status: AC

## 2013-11-26 MED FILL — Bivalirudin Trifluoroacetate For IV Soln 250 MG (Base Equiv): INTRAVENOUS | Qty: 250 | Status: AC

## 2013-11-26 MED FILL — Nitroglycerin IV Soln 200 MCG/ML in D5W: INTRAVENOUS | Qty: 1 | Status: AC

## 2013-11-26 MED FILL — Sodium Chloride IV Soln 0.9%: INTRAVENOUS | Qty: 50 | Status: AC

## 2013-11-26 MED FILL — Ticagrelor Tab 90 MG: ORAL | Qty: 1 | Status: AC

## 2013-11-26 MED FILL — Lidocaine HCl Local Preservative Free (PF) Inj 1%: INTRAMUSCULAR | Qty: 30 | Status: AC

## 2013-11-26 NOTE — Clinical Documentation Improvement (Signed)
Clinical Indicators  "CHF" documented in progress note  CXR on admission showing Pulmonary Edema  LV Gram EF 20%  Treated with IV Lasix on 11/26/2013.   Possible Clinical Conditions   - Acute Systolic Heart Failure   - Other Condition   - Unable to Clinically Determine   Thank You, Erling Conte ,RN BSN CCDS Certified Clinical Documentation Specialist:  [phone]   [place] Information Management

## 2013-11-26 NOTE — Progress Notes (Signed)
  Echocardiogram 2D Echocardiogram has been performed.  [place], [facility] 11/26/2013, 11:44 AM

## 2013-11-26 NOTE — H&P (Signed)
Acute anterior MI due to stent thrombosis suspected. Not on any CV meds.

## 2013-11-26 NOTE — Progress Notes (Signed)
Patient ID: Debra Doyle, female   DOB: 03-16-1929, 80 y.o.   MRN: [id number]    Subjective:  Denies SSCP, palpitations or Dyspnea   Objective:  Filed Vitals:   11/26/13 0400 11/26/13 0500 11/26/13 0600 11/26/13 0610  BP: 140/80 158/81 152/81 147/79  Pulse: 70 71 64 70  Temp: 98 F (36.7 C)     TempSrc: Oral     Resp: 15 15 15 17   Height:      Weight:  153 lb 3.5 oz (69.5 kg)    SpO2: 99% 98% 98% 98%    Intake/Output from previous day:  Intake/Output Summary (Last 24 hours) at 11/26/13 0818 Last data filed at 11/26/13 0700  Gross per 24 hour  Intake    280 ml  Output    200 ml  Net     80 ml    Physical Exam: Affect appropriate Healthy:  appears stated age HEENT: normal Neck supple with no adenopathy JVP normal no bruits no thyromegaly Lungs clear with no wheezing and good diaphragmatic motion Heart:  S1/S2 no murmur, no rub, gallop or click PMI normal Abdomen: benighn, BS positve, no tenderness, no AAA no bruit.  No HSM or HJR Distal pulses intact with no bruits No edema Neuro non-focal Skin warm and dry No muscular weakness   Lab Results: Basic Metabolic Panel:  Recent Labs  11/25/13 1711 11/26/13 0233  NA 128* 127*  K 4.3 3.8  CL 93* 95*  CO2 24 20  GLUCOSE 126* 111*  BUN 10 9  CREATININE 0.63 0.50  CALCIUM 8.4 8.2*  MG 1.9 1.7   Liver Function Tests:  Recent Labs  11/25/13 1711  AST 61*  ALT 16  ALKPHOS 77  BILITOT 0.4  PROT 6.5  ALBUMIN 3.2*   CBC:  Recent Labs  11/25/13 1352  11/25/13 1711 11/26/13 0233  WBC 6.7  --  10.7* 6.5  NEUTROABS 5.2  --  9.4*  --   HGB 17.5*  < > 15.0 12.9  HCT 47.2*  < > 40.7 35.3*  MCV 93.7  --  92.1 90.7  PLT 229  --  180 165  < > = values in this interval not displayed. Cardiac Enzymes:  Recent Labs  11/25/13 1711 11/25/13 2145 11/26/13 0233  TROPONINI 8.47* >20.00* >20.00*   Hemoglobin A1C:  Recent Labs  11/25/13 1711  HGBA1C 5.0   Fasting Lipid Panel:  Recent Labs  11/26/13 0223  CHOL 176  HDL 42  LDLCALC 114*  TRIG 99  CHOLHDL 4.2   Thyroid Function Tests:  Recent Labs  11/25/13 1711  TSH 2.121    Imaging: Dg Chest Portable 1 View  11/25/2013   CLINICAL DATA:  Chest pain and shortness of breath.  EXAM: PORTABLE CHEST - 1 VIEW  COMPARISON:  Plain film of the chest 12/09/2010.  FINDINGS: There is cardiomegaly and pulmonary edema. No consolidative process or pneumothorax. No pleural effusion.  IMPRESSION: Cardiomegaly and pulmonary edema.   Electronically Signed   By: Inge Rise M.D.   On: 11/25/2013 14:03    Cardiac Studies:  ECG:  SR IVCD evolving anterior MI  PVC    Telemetry:  NSR no VT  Echo:   Medications:   . aspirin  324 mg Oral NOW   Or  . aspirin  300 mg Rectal NOW  . aspirin  81 mg Oral Daily  . atorvastatin  40 mg Oral q1800  . metoprolol tartrate  12.5 mg Oral BID  .  multivitamin with minerals  1 tablet Oral Daily  . omega-3 acid ethyl esters  1 g Oral Daily  . Ticagrelor  90 mg Oral BID     . sodium chloride    . sodium chloride 100 mL/hr at 11/25/13 1700    Assessment/Plan:  Ant MI:  DAT for a year  Stable post intervention  ECG still with impressive changes suggesting less likelyhood of LV recovery CHF:  On admitting CXR  Comfortable this am  EDP at cath 14 which seems low start ACE will give one dose of lasix today Will ask EP to see before d/c regarding lifevest  Needs MRI/MUGA/Echo in 40 days to help decide on AICD  Echo at bedside today  Transfer to floor in am if stable   Jenkins Rouge 11/26/2013, 8:18 AM

## 2013-11-26 NOTE — Progress Notes (Signed)
CARDIAC REHAB PHASE I   PRE:  Rate/Rhythm: 54 SR w/ PVC  BP:  Supine:   Sitting: 133/58  Standing:    SaO2: 99% RA  MODE:  Ambulation: 350 ft   POST:  Rate/Rhythm: 96 SR w/ PVC  BP:  Supine:   Sitting: 153/74  Standing:    SaO2: 100% RA  1025-1102 Patient ambulated 350 ft with assist x1 and pushing rolling walker. Tolerated ambulation well, gait steady, no c/o. To chair after walk with legs elevated, call bell within reach.  Seward Carol, MS, ACSM CES

## 2013-11-26 NOTE — Progress Notes (Signed)
Pt. Had 6 beat run of v-tach  While asleep, denies any chest pain or short of breath but stated it felt funny for awhile her chest but it went away. BP stable on cont. NTG drip. Dr. Claiborne Billings on call made aware with orders to have electrolytes drawn this am.

## 2013-11-27 LAB — PRO B NATRIURETIC PEPTIDE: Pro B Natriuretic peptide (BNP): 4685 pg/mL — ABNORMAL HIGH (ref 0–450)

## 2013-11-27 LAB — BASIC METABOLIC PANEL
BUN: 14 mg/dL (ref 6–23)
CHLORIDE: 98 meq/L (ref 96–112)
CO2: 21 meq/L (ref 19–32)
CREATININE: 0.68 mg/dL (ref 0.50–1.10)
Calcium: 8.7 mg/dL (ref 8.4–10.5)
GFR calc Af Amer: >90 mL/min (ref >90)
GFR calc non Af Amer: 79 mL/min — ABNORMAL LOW (ref >90)
GLUCOSE: 88 mg/dL (ref 70–99)
Potassium: 4.2 mEq/L (ref 3.7–5.3)
Sodium: 135 mEq/L — ABNORMAL LOW (ref 137–147)

## 2013-11-27 MED ORDER — SPIRONOLACTONE 12.5 MG HALF TABLET
12.5000 mg | ORAL_TABLET | Freq: Every day | ORAL | Status: DC
  Administered 2013-11-27 – 2013-11-29 (×3): 12.5 mg via ORAL
  Filled 2013-11-27 (×3): qty 1

## 2013-11-27 NOTE — Progress Notes (Signed)
CARDIAC REHAB PHASE I   PRE:  Rate/Rhythm: 64SR  BP:  Supine: 105/71  Sitting:   Standing:    SaO2:   MODE:  Ambulation: 550 ft   POST:  Rate/Rhythm: 66 SR  BP:  Supine:   Sitting: 126/77  Standing:    SaO2: 95%RA 1116-1200 Pt walked 550 ft with asst x 1 after putting on her shoes. Gait steady with asst. No CP. To bed after walk. Gave MI and CHF booklets. Reviewed with pt when to call MD with weight gain and how to use NTG. Discussed CRP 2 but pt declined as she wants to go to [facility].  Will continue ed.   Graylon Good, RN BSN  11/27/2013 11:54 AM

## 2013-11-27 NOTE — Progress Notes (Signed)
Patient ID: Debra Doyle, female   DOB: 1928-12-28, 80 y.o.   MRN: [id number] Patient ID: Debra Doyle, female   DOB: 03-17-1929, 80 y.o.   MRN: [id number]    Subjective:  Denies SSCP, palpitations or Dyspnea   Objective:  Filed Vitals:   11/27/13 0400 11/27/13 0500 11/27/13 0600 11/27/13 0705  BP: 109/67  94/65 119/75  Pulse:    89  Temp: 98 F (36.7 C)   98.2 F (36.8 C)  TempSrc: Oral   Oral  Resp:    18  Height:      Weight:  152 lb 1.9 oz (69 kg)    SpO2:    98%    Intake/Output from previous day:  Intake/Output Summary (Last 24 hours) at 11/27/13 0818 Last data filed at 11/27/13 0800  Gross per 24 hour  Intake    660 ml  Output   1950 ml  Net  -1290 ml    Physical Exam: Affect appropriate Healthy:  appears stated age HEENT: normal Neck supple with no adenopathy JVP normal no bruits no thyromegaly Lungs clear with no wheezing and good diaphragmatic motion Heart:  S1/S2 no murmur, no rub, gallop or click PMI normal Abdomen: benighn, BS positve, no tenderness, no AAA no bruit.  No HSM or HJR Distal pulses intact with no bruits No edema Neuro non-focal Skin warm and dry No muscular weakness   Lab Results: Basic Metabolic Panel:  Recent Labs  11/25/13 1711 11/26/13 0233 11/26/13 0908  NA 128* 127* 132*  K 4.3 3.8 3.9  CL 93* 95* 96  CO2 24 20 20   GLUCOSE 126* 111* 131*  BUN 10 9 8   CREATININE 0.63 0.50 0.53  CALCIUM 8.4 8.2* 8.3*  MG 1.9 1.7  --    Liver Function Tests:  Recent Labs  11/25/13 1711  AST 61*  ALT 16  ALKPHOS 77  BILITOT 0.4  PROT 6.5  ALBUMIN 3.2*   CBC:  Recent Labs  11/25/13 1352  11/25/13 1711 11/26/13 0233  WBC 6.7  --  10.7* 6.5  NEUTROABS 5.2  --  9.4*  --   HGB 17.5*  < > 15.0 12.9  HCT 47.2*  < > 40.7 35.3*  MCV 93.7  --  92.1 90.7  PLT 229  --  180 165  < > = values in this interval not displayed. Cardiac Enzymes:  Recent Labs  11/25/13 1711 11/25/13 2145 11/26/13 0233  TROPONINI 8.47* >20.00*  >20.00*   Hemoglobin A1C:  Recent Labs  11/25/13 1711  HGBA1C 5.0   Fasting Lipid Panel:  Recent Labs  11/26/13 0223  CHOL 176  HDL 42  LDLCALC 114*  TRIG 99  CHOLHDL 4.2   Thyroid Function Tests:  Recent Labs  11/25/13 1711  TSH 2.121    Imaging: Dg Chest Portable 1 View  11/25/2013   CLINICAL DATA:  Chest pain and shortness of breath.  EXAM: PORTABLE CHEST - 1 VIEW  COMPARISON:  Plain film of the chest 12/09/2010.  FINDINGS: There is cardiomegaly and pulmonary edema. No consolidative process or pneumothorax. No pleural effusion.  IMPRESSION: Cardiomegaly and pulmonary edema.   Electronically Signed   By: Inge Rise M.D.   On: 11/25/2013 14:03    Cardiac Studies:  ECG:  SR IVCD evolving anterior MI  PVC    Telemetry:  NSR no VT  Echo: Study Conclusions  - Left ventricle: The cavity size was normal. Wall thickness was increased in a pattern of mild LVH.  Systolic function was moderately to severely reduced. The estimated ejection fraction was in the range of 30% to 35%. Severe hypokinesis of the mid-distalanteroseptal, anterior, anterolateral, and apical myocardium; consistent with ischemia in the distribution of the left anterior descending coronary artery. - Mitral valve: Mild regurgitation.   Medications:   . aspirin  81 mg Oral Daily  . atorvastatin  40 mg Oral q1800  . losartan  50 mg Oral Daily  . metoprolol tartrate  12.5 mg Oral BID  . multivitamin with minerals  1 tablet Oral Daily  . omega-3 acid ethyl esters  1 g Oral Daily  . Ticagrelor  90 mg Oral BID     . sodium chloride Stopped (11/26/13 0800)  . sodium chloride 100 mL/hr at 11/25/13 1700    Assessment/Plan:  Ant MI:  DAT for a year  Stable post intervention  ECG still with impressive changes suggesting less likelyhood of LV recovery CHF:  On admitting CXR  Comfortable this am  EDP at cath 14 which seems low start ACE will give one dose of lasix today Will ask EP to see before  d/c regarding lifevest  Needs MRI/MUGA/Echo in 40 days to help decide on AICD  Echo EF 30-35% Start 12.5 mg aldactone today   Transfer floor today target D/C Saturday   Jenkins Rouge 11/27/2013, 8:18 AM

## 2013-11-27 NOTE — Progress Notes (Signed)
Debra Doyle has been enrolled in the Med-to-Bed Pilot Program for STEMI patients.  This program is designed to help patients transition out of the hospital by ensuring that patients have the opportunity to obtain medications and education on each medication.  The medications will be processed through the patient's insurance and delivered to the bedside prior to discharge.    The medications included are P2Y12 inhibitors, statins, beta-blockers, and aspirin.    When planning for discharge, please send prescriptions for the above medications to the [facility] (will be added to the patient's pharmacy list).    Please feel free to page me with questions or concerns!  Thank you, Vivia Ewing, PharmD Clinical Pharmacist - Resident Pager: [phone] Pharmacy: [phone] 11/27/2013 11:45 AM

## 2013-11-28 MED ORDER — DOCUSATE SODIUM 100 MG PO CAPS
100.0000 mg | ORAL_CAPSULE | Freq: Every day | ORAL | Status: DC | PRN
  Administered 2013-11-28: 100 mg via ORAL
  Filled 2013-11-28: qty 1

## 2013-11-28 MED ORDER — METOPROLOL TARTRATE 25 MG PO TABS
25.0000 mg | ORAL_TABLET | Freq: Two times a day (BID) | ORAL | Status: DC
Start: 2013-11-28 — End: 2013-11-29
  Administered 2013-11-28 – 2013-11-29 (×3): 25 mg via ORAL
  Filled 2013-11-28 (×4): qty 1

## 2013-11-28 NOTE — Progress Notes (Signed)
Patient has some questions/concerns related to her starting Brilinta. Patient stated that nobody explained to her why she is taking this medication, or anything about the medication. Patient also has some questions about the Brilinta free supply card that was given to her today. I did give the patient a general overview of what the medication was for, but her and her son would like a MD to come talk to her about the medication, and the [employer] life vest. Jenness Stemler R

## 2013-11-28 NOTE — Progress Notes (Signed)
CARDIAC REHAB PHASE I   PRE:  Rate/Rhythm: 22 SR  BP:  Supine:   Sitting: 112/60  Standing:    SaO2: 98% RA  MODE:  Ambulation: 550 ft   POST:  Rate/Rhythm: 73 SR  BP:  Supine:   Sitting: 122/66  Standing:    SaO2: 93% RA Tolerated ambulation well with assistance x 1, no angina symptoms with walking 550 ft.  Education completed re: heart healthy diet, NTG usage, angina symptoms, activity restrictions post MI, and exercise progression.  Patient has a stationary bicycle at home that she will use.  Refused phase II cardiac rehab referral.  Encouraged to walk with her family or staff 2 more times today.   [id number]  Liliane Channel RN, BSN 11/28/2013 11:50 AM

## 2013-11-28 NOTE — Progress Notes (Signed)
Subjective: Denies any chest pain or SOB. No difficulty ambulating. Main complaint is constipation.   Objective: Vital signs in last 24 hours: Temp:  [98 F (36.7 C)-98.4 F (36.9 C)] 98.4 F (36.9 C) (01/16 0505) Pulse Rate:  [70-72] 71 (01/16 0505) Resp:  [16-20] 16 (01/16 0505) BP: (109-137)/(56-84) 137/67 mmHg (01/16 0505) SpO2:  [98 %-100 %] 100 % (01/16 0505) Weight:  [150 lb 5.7 oz (68.2 kg)] 150 lb 5.7 oz (68.2 kg) (01/16 0505) Last BM Date: 11/25/13 (11/25/2013)  Intake/Output from previous day: 01/15 0701 - 01/16 0700 In: 480 [P.O.:480] Out: -  Intake/Output this shift:    Medications Current Facility-Administered Medications  Medication Dose Route Frequency Provider Last Rate Last Dose  . 0.9 %  sodium chloride infusion   Intravenous Continuous Cecilie Kicks, NP      . 0.9 %  sodium chloride infusion   Intravenous Continuous Belva Crome III, MD 100 mL/hr at 11/25/13 1700    . acetaminophen (TYLENOL) tablet 650 mg  650 mg Oral Q4H PRN Cecilie Kicks, NP      . ALPRAZolam Duanne Moron) tablet 0.25 mg  0.25 mg Oral BID PRN Cecilie Kicks, NP      . aspirin chewable tablet 81 mg  81 mg Oral Daily Belva Crome III, MD   81 mg at 11/27/13 0911  . atorvastatin (LIPITOR) tablet 40 mg  40 mg Oral q1800 Cecilie Kicks, NP   40 mg at 11/27/13 1821  . losartan (COZAAR) tablet 50 mg  50 mg Oral Daily Josue Hector, MD   50 mg at 11/27/13 0912  . metoprolol tartrate (LOPRESSOR) tablet 12.5 mg  12.5 mg Oral BID Cecilie Kicks, NP   12.5 mg at 11/27/13 2342  . multivitamin with minerals tablet 1 tablet  1 tablet Oral Daily Cecilie Kicks, NP   1 tablet at 11/27/13 0912  . nitroGLYCERIN (NITROSTAT) SL tablet 0.4 mg  0.4 mg Sublingual Q5 min PRN Cecilie Kicks, NP      . omega-3 acid ethyl esters (LOVAZA) capsule 1 g  1 g Oral Daily Cecilie Kicks, NP   1 g at 11/27/13 0912  . ondansetron (ZOFRAN) injection 4 mg  4 mg Intravenous Q6H PRN Cecilie Kicks, NP      . ondansetron [facility]) injection 4 mg   4 mg Intravenous Q6H PRN Belva Crome III, MD   4 mg at 11/26/13 4097  . spironolactone (ALDACTONE) tablet 12.5 mg  12.5 mg Oral Daily Josue Hector, MD   12.5 mg at 11/27/13 1120  . Ticagrelor (BRILINTA) tablet 90 mg  90 mg Oral BID Belva Crome III, MD   90 mg at 11/27/13 2342  . zolpidem (AMBIEN) tablet 5 mg  5 mg Oral QHS PRN Cecilie Kicks, NP        PE: General appearance: alert, cooperative and no distress Lungs: clear to auscultation bilaterally Heart: regular rate and rhythm, S1, S2 normal, no murmur, click, rub or gallop Extremities: no LEE Pulses: 2+ and symmetric Skin: warm and dry Neurologic: Grossly normal  Lab Results:   Recent Labs  11/25/13 1352 11/25/13 1533 11/25/13 1711 11/26/13 0233  WBC 6.7  --  10.7* 6.5  HGB 17.5* 16.0* 15.0 12.9  HCT 47.2* 47.0* 40.7 35.3*  PLT 229  --  180 165   BMET  Recent Labs  11/26/13 0233 11/26/13 0908 11/27/13 0740  NA 127* 132* 135*  K 3.8 3.9 4.2  CL 95* 96 98  CO2 20 20 21   GLUCOSE 111* 131* 88  BUN 9 8 14   CREATININE 0.50 0.53 0.68  CALCIUM 8.2* 8.3* 8.7   PT/INR  Recent Labs  11/25/13 1711  LABPROT 22.9*  INR 2.10*   Cholesterol  Recent Labs  11/26/13 0223  CHOL 176   Cardiac Panel (last 3 results)  Recent Labs  11/25/13 1711 11/25/13 2145 11/26/13 0233  TROPONINI 8.47* >20.00* >20.00*     Studies/Results:  2D echo 11/26/13 Study Conclusions  - Left ventricle: The cavity size was normal. Wall thickness was increased in a pattern of mild LVH. Systolic function was moderately to severely reduced. The estimated ejection fraction was in the range of 30% to 35%. Severe hypokinesis of the mid-distalanteroseptal, anterior, anterolateral, and apical myocardium; consistent with ischemia in the distribution of the left anterior descending coronary artery. - Mitral valve: Mild regurgitation.   Assessment/Plan  Principal Problem:   ST elevation myocardial infarction (STEMI) of anterior  wall, 11/25/2013 Active Problems:   Mixed hyperlipidemia   Essential hypertension, benign   CORONARY ATHEROSCLEROSIS NATIVE CORONARY ARTERY, hx 4 DES stents to LAD 11/2010   CARDIOMYOPATHY, ISCHEMIC, EF 40-45% 2012; now with EF 35% at cath 11/25/12   Acute ST elevation myocardial infarction (STEMI) involving left anterior descending coronary artery  Plan: Day 3 s/p STEMI, subsequent to LAD in-stent restenosis with acute thrombosis, in the setting of dual antiplatelet therapy discontinuation of proximally 6 months ago. Successfully treated with catheter thrombectomy and angioplasty of the LAD proximal segment. No further CP/SOB. On DAPT with ASA + Brilinta. Also on BB, ARB, statin and omega 3. She is having frequent PVCs on telemetry. We may need to titrate her Lopressor. She is currently on 12.5 mg BID. Her HR is in the 70s and BP is stable with SBP in the 130s.  Spironolactone has been added for severe left ventricular dysfunction, with an EF of 30-35%. Would recommend Lifevest. ? If EP needs to see in regards to this (see Dr. Kyla Balzarine Progress note from 11/27/12). K+ is WNL in setting of ARB and spironolactone use. ? If we need to initiate a low dose diuretic when discharged.  Can likely send home once patient is fitted with LifeVest. Will order PRN meds for constipation. MD to follow.     LOS: 3 days    Brittainy M. Ladoris Gene 11/28/2013 8:52 AM  History and all data above reviewed.  Patient examined.  I agree with the findings as above.  No chest pain.  No SOB The patient exam reveals COR:RRR  ,  Lungs: Clear  ,  Abd: Positive bowel sounds, no rebound no guarding, Ext No edema  .  All available labs, radiology testing, previous records reviewed. Agree with documented assessment and plan.  Anterior MI:  With reduced EF and ectopy I will arrange a Lifevest.  Discussed with the patient.    Jeneen Rinks [facility]  9:34 AM  11/28/2013

## 2013-11-29 ENCOUNTER — Telehealth (HOSPITAL_COMMUNITY): Payer: Self-pay

## 2013-11-29 MED ORDER — TICAGRELOR 90 MG PO TABS: 90.0000 mg | ORAL_TABLET | Freq: Two times a day (BID) | ORAL | Status: DC

## 2013-11-29 MED ORDER — LOSARTAN POTASSIUM 50 MG PO TABS: 50.0000 mg | ORAL_TABLET | Freq: Every day | ORAL | Status: DC

## 2013-11-29 MED ORDER — SPIRONOLACTONE 12.5 MG HALF TABLET: 12.5000 mg | ORAL_TABLET | Freq: Every day | ORAL | Status: DC

## 2013-11-29 MED ORDER — METOPROLOL TARTRATE 25 MG PO TABS: 25.0000 mg | ORAL_TABLET | Freq: Two times a day (BID) | ORAL | Status: DC

## 2013-11-29 MED ORDER — ATORVASTATIN CALCIUM 40 MG PO TABS: 40.0000 mg | ORAL_TABLET | Freq: Every day | ORAL | Status: DC

## 2013-11-29 NOTE — Discharge Summary (Signed)
Physician Discharge Summary  Patient ID: Debra Doyle MRN: [id number] DOB/AGE: February 09, 1929 80 y.o.  Admit date: 11/25/2013 Discharge date: 11/29/2013  Primary Discharge Diagnosis 1.STEMI 2. S/P PCI of LAD due to ISR   Secondary Discharge Diagnosis: 1. Hypertension  Significant Diagnostic Studies: Cardiac Cath 11/25/2013 1. LAD in-stent restenosis with acute thrombosis in the setting of dual antiplatelet therapy discontinuation of proximally 6 months ago.  2. Successful catheter thrombectomy and angioplasty of the LAD proximal segment with return of TIMI grade 3 flow and less than 10% stenosis. No additional new stents were placed. Postdilatation was aggressive and to a larger diameter than the previous procedure.  3. Severe left ventricular dysfunction due to anterior wall motion abnormality from prior infarction aggravated by the current event with reduction and LVEF from 35% (2012) to 20% currently (acute).   Hospital Course: Debra Doyle is an 80 year old female admitted with chest pain shortness of breath STEMI known history of acute coronary syndrome with admission in January 2012 with drug-eluting stent time for the LAD. The patient had sudden onset of indigestion and feeling of pressure in her chest it lingered but eventually went away, with recurrence with associated shortness of breath. On arrival to the ER the patient had ST elevation in the anterior leads, at [facility]. The [facility] emerg[facility] for cardiac catheterization started on aspirin nitroglycerin and heparin.    Cardiac catheterization revealed in-stent restenosis in the LAD with acute thrombosis in the setting of dual antiplatelet therapy discontinuation proximally 6 months prior. She successful catheter thrombectomy angiography with angioplasty of the LAD, in the proximal segment with return of flow. She was reverted to less than 10% stenosis. LVEF was reduced to 20% from previously  documented EF of 35% 2012.    The patient was also found to have acute on chronic systolic heart failure, she was continued on Lasix addition of spironolactone.    She was restarted on aspirin and Brilinta to be on this medication for a minimum of one year. The patient will need to be considered for ICD pacemaker if LV systolic function remains less than 35% on followup echocardiogram which should be completed after she has been on maximum medical therapy. A life vest was placed on the patient. She was given instructions on life vest wearing and home activities. She was seen by Dr. Warren Lacy day of discharge and found to be stable. We will continue all medications as prescribed during hospitalization with close followup with Dr. Domenic Polite post discharge. Appointment will be made to our office will call her date and time.   Discharge Exam: Blood pressure 110/63, pulse 68, temperature 98.2 F (36.8 C), temperature source Oral, resp. rate 18, height 5\' 4"  (1.626 m), weight 147 lb 11.3 oz (67 kg), SpO2 99.00%.   Labs:   Lab Results  Component Value Date   WBC 6.5 11/26/2013   HGB 12.9 11/26/2013   HCT 35.3* 11/26/2013   MCV 90.7 11/26/2013   PLT 165 11/26/2013    Recent Labs Lab 11/25/13 1711  11/27/13 0740  NA 128*  < > 135*  K 4.3  < > 4.2  CL 93*  < > 98  CO2 24  < > 21  BUN 10  < > 14  CREATININE 0.63  < > 0.68  CALCIUM 8.4  < > 8.7  PROT 6.5  --   --   BILITOT 0.4  --   --   ALKPHOS 77  --   --  ALT 16  --   --   AST 61*  --   --   GLUCOSE 126*  < > 88  < > = values in this interval not displayed.     Radiology: Dg Chest Portable 1 View  11/25/2013   CLINICAL DATA:  Chest pain and shortness of breath.  EXAM: PORTABLE CHEST - 1 VIEW  COMPARISON:  Plain film of the chest 12/09/2010.  FINDINGS: There is cardiomegaly and pulmonary edema. No consolidative process or pneumothorax. No pleural effusion.  IMPRESSION: Cardiomegaly and pulmonary edema.   Electronically Signed   By: Inge Rise M.D.   On: 11/25/2013 14:03     FOLLOW UP PLANS AND APPOINTMENTS     Discharge Orders   Future Orders Complete By Expires   Diet - low sodium heart healthy  As directed    Increase activity slowly  As directed        Medication List    STOP taking these medications       Flaxseed Oil 1000 MG Caps      TAKE these medications       aspirin 325 MG tablet  Take 325 mg by mouth daily.     atorvastatin 40 MG tablet  Commonly known as:  LIPITOR  Take 1 tablet (40 mg total) by mouth daily at 6 PM.     Bilberry 100 MG Caps  Take by mouth daily.     Cinnamon 500 MG capsule  Take 500 mg by mouth daily.     Fish Oil 1000 MG Caps  Take by mouth daily.     Glucosamine 500 MG Caps  Take by mouth 2 (two) times daily.     losartan 50 MG tablet  Commonly known as:  COZAAR  Take 1 tablet (50 mg total) by mouth daily.     Magnesium 300 MG Caps  Take by mouth 2 (two) times daily.     metoprolol tartrate 25 MG tablet  Commonly known as:  LOPRESSOR  Take 1 tablet (25 mg total) by mouth 2 (two) times daily.     multivitamin tablet  Take 1 tablet by mouth daily.     nitroGLYCERIN 0.4 MG SL tablet  Commonly known as:  NITROSTAT  Place 0.4 mg under the tongue every 5 (five) minutes as needed.     spironolactone 12.5 mg Tabs tablet  Commonly known as:  ALDACTONE  Take 0.5 tablets (12.5 mg total) by mouth daily.     Ticagrelor 90 MG Tabs tablet  Commonly known as:  BRILINTA  Take 1 tablet (90 mg total) by mouth 2 (two) times daily.     vitamin C 500 MG tablet  Commonly known as:  ASCORBIC ACID  Take 500 mg by mouth daily.       Follow-up Information   Follow up with Rozann Lesches, MD. (Our office will call you for appt,.)    Specialty:  Cardiology   Contact information:   [place] [id number] [phone]         Time spent with patient to include physician time:40 minutes Signed: Jory Sims 11/29/2013, 10:43 AM Co-Sign  MD   Patient seen and examined.  Plan as discussed in my rounding note for today and outlined above. Jeneen Rinks [facility]  11/29/2013  11:02 AM

## 2013-11-29 NOTE — Progress Notes (Signed)
   SUBJECTIVE:  No pain.  No SOB   PHYSICAL EXAM Filed Vitals:   11/28/13 0505 11/28/13 0900 11/28/13 2150 11/29/13 0512  BP: 137/67 119/74 125/76 110/63  Pulse: 71  75 68  Temp: 98.4 F (36.9 C)  98.3 F (36.8 C) 98.2 F (36.8 C)  TempSrc: Oral  Oral Oral  Resp: 16  17 18   Height:      Weight: 150 lb 5.7 oz (68.2 kg)   147 lb 11.3 oz (67 kg)  SpO2: 100%  96% 99%   General:  No distress. Lungs:  Clear Heart:  RRR Abdomen:  Positive bowel sounds, no rebound no guarding Extremities:  No edema   LABS: Lab Results  Component Value Date   TROPONINI >20.00* 11/26/2013   No results found for this or any previous visit (from the past 24 hour(s)).  Intake/Output Summary (Last 24 hours) at 11/29/13 0911 Last data filed at 11/28/13 1700  Gross per 24 hour  Intake    480 ml  Output      0 ml  Net    480 ml    ASSESSMENT AND PLAN:  ANTERIOR MI:  OK to discharge on meds as listed.   She did have some gum bleeding and she probably needs to use an oral rinse and let us know if this gets worse.    REDUCED EF:  No sustained ectopy on telemetry.  Home with a Life vest.  She needs more education before discharge.    HTN:   Managed in the context of treating the above.   Discharge to follow with Dr. Domenic Polite.  Needs TOC appt.    Jeneen Rinks [facility] 11/29/2013 9:11 AM

## 2013-12-01 ENCOUNTER — Telehealth: Payer: Self-pay | Admitting: *Deleted

## 2013-12-01 NOTE — Telephone Encounter (Signed)
Patient contacted regarding discharge from [facility] on 11-29-13.  Patient understands to follow up with provider Leonia Reader NP on 12-08-13 at 2:30pm at [place] office . Patient understands discharge instructions?  Patient understands medications and regiment?  Patient understands to bring all medications to this visit?   Pt not available left message for pt to call office 12-01-2013 AND 12-02-2013, left vm to advise pt apt date/time/location and to bring medications and call our office with any concerns

## 2013-12-01 NOTE — Telephone Encounter (Signed)
7 day TCM

## 2013-12-08 ENCOUNTER — Encounter: Payer: Medicare Other | Admitting: Adult Health

## 2013-12-08 NOTE — Progress Notes (Signed)
HPI: Debra Doyle is an 80 year old female patient of Dr. Domenic Polite, we are following posthospitalization after admission in the setting of ST elevation MI, status post PCI of the LAD due to in-stent restenosis. Patient was admitted on 11/25/2013 with chest pain and shortness of breath, patient has a history of a DES to the LAD placed in 2012.  She was taken emergently to catheterization lab where cardiac catheterization was completed with successful catheter thrombectomy and angioplasty of the LAD, proximal segment with return of TIMI 1 grade 3 flow and less than 10% stenosis. No additional new stents were placed.The postdilatation was aggressive and to a larger diameter than the previous procedure. The patient's LVEF was reduced from 35% in 2012 to 20% at present.  The patient was restarted on aspirin and Brilinta, she was treated for acute systolic heart failure and continued on Lasix with addition of spironolactone. A life vest was placed on the patient. She is here for post hospitalization followup. Medications will need to be adjusted for maximum medical and therapeutic levels.  She comes today stating that she is very tired and easily fatigued. She is having some bleeding in her gums. She is unhappy about her limitations in activity. She denies Life Vest shocks, but states it vibrates a lot. She has called the company she has been asked to take the battery out and put it back in. Its helps but will vibrate again in a few days. She is medically compliant.  No Known Allergies  Current Outpatient Prescriptions  Medication Sig Dispense Refill  . Ascorbic Acid (VITAMIN C) 500 MG tablet Take 500 mg by mouth daily.        Marland Kitchen aspirin 325 MG tablet Take 325 mg by mouth daily.        Marland Kitchen atorvastatin (LIPITOR) 40 MG tablet Take 1 tablet (40 mg total) by mouth daily at 6 PM.  30 tablet  6  . Bilberry 100 MG CAPS Take by mouth daily.        . Cinnamon 500 MG capsule Take 500 mg by mouth daily.        .  Glucosamine 500 MG CAPS Take by mouth 2 (two) times daily.        Marland Kitchen losartan (COZAAR) 50 MG tablet Take 1 tablet (50 mg total) by mouth daily.  30 tablet  30  . Magnesium 300 MG CAPS Take by mouth 2 (two) times daily.        . metoprolol tartrate (LOPRESSOR) 25 MG tablet Take 1 tablet (25 mg total) by mouth 2 (two) times daily.  60 tablet  10  . Multiple Vitamin (MULTIVITAMIN) tablet Take 1 tablet by mouth daily.        . nitroGLYCERIN (NITROSTAT) 0.4 MG SL tablet Place 0.4 mg under the tongue every 5 (five) minutes as needed.        . Omega-3 Fatty Acids (FISH OIL) 1000 MG CAPS Take by mouth daily.        Marland Kitchen spironolactone (ALDACTONE) 12.5 mg TABS tablet Take 0.5 tablets (12.5 mg total) by mouth daily.  30 tablet  10  . Ticagrelor (BRILINTA) 90 MG TABS tablet Take 1 tablet (90 mg total) by mouth 2 (two) times daily.  60 tablet  6   No current facility-administered medications for this visit.    Past Medical History  Diagnosis Date  . Coronary atherosclerosis of native coronary artery     DES x 4  to LAD 1/12, LVEF 40-45%  .  Essential hypertension, benign   . Myocardial infarction     NSTEMI 1/12  . Fibrocystic breast disease   . Hx of colonic polyps     Past Surgical History  Procedure Laterality Date  . Cataract extraction, bilateral      Bilateral  . Coronary stent placement      BWG:YKZLDJ of systems complete and found to be negative unless listed above  PHYSICAL EXAM BP 126/74  Pulse 80  Ht 5\' 3"  (1.6 m)  Wt 150 lb (68.04 kg)  BMI 26.58 kg/m2  General: Well developed, well nourished, in no acute distress.Anxious Head: Eyes PERRLA, No xanthomas.   Normal cephalic and atramatic  Lungs: Clear bilaterally to auscultation and percussion. Heart: HRRR S1 S2, distant heart sounds without MRG.  Pulses are 2+ & equal.            No carotid bruit. No JVD.  No abdominal bruits. No femoral bruits. Abdomen: Bowel sounds are positive, abdomen soft and non-tender without masses or                    Hernia's noted. Msk:  Back normal, normal gait. Normal strength and tone for age. Extremities: No clubbing, cyanosis or edema.  DP +1 Neuro: Alert and oriented X 3. Psych:  Good affect, responds appropriately.    ASSESSMENT AND PLAN

## 2013-12-09 ENCOUNTER — Encounter: Payer: Self-pay | Admitting: Adult Health

## 2013-12-09 ENCOUNTER — Ambulatory Visit (INDEPENDENT_AMBULATORY_CARE_PROVIDER_SITE_OTHER): Payer: Medicare Other | Admitting: Adult Health

## 2013-12-09 VITALS — BP 126/74 | HR 80 | Ht 63.0 in | Wt 150.0 lb

## 2013-12-09 DIAGNOSIS — I2109 ST elevation (STEMI) myocardial infarction involving other coronary artery of anterior wall: Secondary | ICD-10-CM

## 2013-12-09 DIAGNOSIS — I10 Essential (primary) hypertension: Secondary | ICD-10-CM

## 2013-12-09 DIAGNOSIS — I2102 ST elevation (STEMI) myocardial infarction involving left anterior descending coronary artery: Secondary | ICD-10-CM

## 2013-12-09 NOTE — Progress Notes (Deleted)
Name: Debra Doyle    DOB: 11-29-1928  Age: 80 y.o.  MR#: [id number]       PCP:  [name], MD      Insurance: Payor: [employer] MEDICARE / Plan: AARP MEDICARE COMPLETE / Product Type: *No Product type* /   CC:    Chief Complaint  Patient presents with  . Coronary Artery Disease  . Hypertension    VS Filed Vitals:   12/09/13 1306  BP: 126/74  Pulse: 80  Height: 5\' 3"  (1.6 m)  Weight: 150 lb (68.04 kg)    Weights Current Weight  12/09/13 150 lb (68.04 kg)  11/29/13 147 lb 11.3 oz (67 kg)  11/29/13 147 lb 11.3 oz (67 kg)    Blood Pressure  BP Readings from Last 3 Encounters:  12/09/13 126/74  11/29/13 152/87  11/29/13 152/87     Admit date:  (Not on file) Last encounter with RMR:  Visit date not found   Allergy Review of patient's allergies indicates no known allergies.  Current Outpatient Prescriptions  Medication Sig Dispense Refill  . Ascorbic Acid (VITAMIN C) 500 MG tablet Take 500 mg by mouth daily.        Marland Kitchen aspirin 325 MG tablet Take 325 mg by mouth daily.        Marland Kitchen atorvastatin (LIPITOR) 40 MG tablet Take 1 tablet (40 mg total) by mouth daily at 6 PM.  30 tablet  6  . Bilberry 100 MG CAPS Take by mouth daily.        . Cinnamon 500 MG capsule Take 500 mg by mouth daily.        . Glucosamine 500 MG CAPS Take by mouth 2 (two) times daily.        Marland Kitchen losartan (COZAAR) 50 MG tablet Take 1 tablet (50 mg total) by mouth daily.  30 tablet  30  . Magnesium 300 MG CAPS Take by mouth 2 (two) times daily.        . metoprolol tartrate (LOPRESSOR) 25 MG tablet Take 1 tablet (25 mg total) by mouth 2 (two) times daily.  60 tablet  10  . Multiple Vitamin (MULTIVITAMIN) tablet Take 1 tablet by mouth daily.        . nitroGLYCERIN (NITROSTAT) 0.4 MG SL tablet Place 0.4 mg under the tongue every 5 (five) minutes as needed.        . Omega-3 Fatty Acids (FISH OIL) 1000 MG CAPS Take by mouth daily.        Marland Kitchen spironolactone (ALDACTONE) 12.5 mg TABS tablet Take 0.5 tablets (12.5 mg  total) by mouth daily.  30 tablet  10  . Ticagrelor (BRILINTA) 90 MG TABS tablet Take 1 tablet (90 mg total) by mouth 2 (two) times daily.  60 tablet  6   No current facility-administered medications for this visit.    Discontinued Meds:   There are no discontinued medications.  Patient Active Problem List   Diagnosis Date Noted  . ST elevation myocardial infarction (STEMI) of anterior wall, 11/25/2013 11/25/2013  . Acute ST elevation myocardial infarction (STEMI) involving left anterior descending coronary artery 11/25/2013  . Mixed hyperlipidemia 12/29/2010  . Essential hypertension, benign 12/29/2010  . ACUT MI SUBENDOCARDIAL INFARCT SUBSQT EPIS CARE 12/29/2010  . CORONARY ATHEROSCLEROSIS NATIVE CORONARY ARTERY, hx 4 DES stents to LAD 11/2010 12/29/2010  . CARDIOMYOPATHY, ISCHEMIC, EF 40-45% 2012; now with EF 35% at cath 11/25/12 12/29/2010    LABS    Component Value Date/Time   NA 135*  11/27/2013 0740   NA 132* 11/26/2013 0908   NA 127* 11/26/2013 0233   K 4.2 11/27/2013 0740   K 3.9 11/26/2013 0908   K 3.8 11/26/2013 0233   CL 98 11/27/2013 0740   CL 96 11/26/2013 0908   CL 95* 11/26/2013 0233   CO2 21 11/27/2013 0740   CO2 20 11/26/2013 0908   CO2 20 11/26/2013 0233   GLUCOSE 88 11/27/2013 0740   GLUCOSE 131* 11/26/2013 0908   GLUCOSE 111* 11/26/2013 0233   BUN 14 11/27/2013 0740   BUN 8 11/26/2013 0908   BUN 9 11/26/2013 0233   CREATININE 0.68 11/27/2013 0740   CREATININE 0.53 11/26/2013 0908   CREATININE 0.50 11/26/2013 0233   CALCIUM 8.7 11/27/2013 0740   CALCIUM 8.3* 11/26/2013 0908   CALCIUM 8.2* 11/26/2013 0233   GFRNONAA 79* 11/27/2013 0740   GFRNONAA 86* 11/26/2013 0908   GFRNONAA 88* 11/26/2013 0233   GFRAA >90 11/27/2013 0740   GFRAA >90 11/26/2013 0908   GFRAA >90 11/26/2013 0233   CMP     Component Value Date/Time   NA 135* 11/27/2013 0740   K 4.2 11/27/2013 0740   CL 98 11/27/2013 0740   CO2 21 11/27/2013 0740   GLUCOSE 88 11/27/2013 0740   BUN 14 11/27/2013 0740    CREATININE 0.68 11/27/2013 0740   CALCIUM 8.7 11/27/2013 0740   PROT 6.5 11/25/2013 1711   ALBUMIN 3.2* 11/25/2013 1711   AST 61* 11/25/2013 1711   ALT 16 11/25/2013 1711   ALKPHOS 77 11/25/2013 1711   BILITOT 0.4 11/25/2013 1711   GFRNONAA 79* 11/27/2013 0740   GFRAA >90 11/27/2013 0740       Component Value Date/Time   WBC 6.5 11/26/2013 0233   WBC 10.7* 11/25/2013 1711   WBC 6.7 11/25/2013 1352   HGB 12.9 11/26/2013 0233   HGB 15.0 11/25/2013 1711   HGB 16.0* 11/25/2013 1533   HCT 35.3* 11/26/2013 0233   HCT 40.7 11/25/2013 1711   HCT 47.0* 11/25/2013 1533   MCV 90.7 11/26/2013 0233   MCV 92.1 11/25/2013 1711   MCV 93.7 11/25/2013 1352    Lipid Panel     Component Value Date/Time   CHOL 176 11/26/2013 0223   TRIG 99 11/26/2013 0223   HDL 42 11/26/2013 0223   CHOLHDL 4.2 11/26/2013 0223   VLDL 20 11/26/2013 0223   LDLCALC 114* 11/26/2013 0223    ABG    Component Value Date/Time   PHART 7.379 11/25/2013 1528   PCO2ART 35.8 11/25/2013 1528   PO2ART 76.0* 11/25/2013 1528   HCO3 21.1 11/25/2013 1528   TCO2 20 11/25/2013 1533   ACIDBASEDEF 3.0* 11/25/2013 1528   O2SAT 95.0 11/25/2013 1528     Lab Results  Component Value Date   TSH 2.121 11/25/2013   BNP (last 3 results)  Recent Labs  11/25/13 1711 11/27/13 0740  PROBNP 3737.0* 4685.0*   Cardiac Panel (last 3 results) No results found for this basename: CKTOTAL, CKMB, TROPONINI, RELINDX,  in the last 72 hours  Iron/TIBC/Ferritin No results found for this basename: iron, tibc, ferritin     EKG Orders placed during the hospital encounter of 11/25/13  . ED EKG  . ED EKG  . EKG 12-LEAD  . EKG 12-LEAD  . EKG 12-LEAD  . EKG 12-LEAD  . EKG 12-LEAD  . EKG 12-LEAD  . EKG 12-LEAD  . EKG 12-LEAD  . EKG 12-LEAD  . EKG 12-LEAD  . EKG  . EKG 12-LEAD  .  EKG 12-LEAD  . EKG 12-LEAD  . EKG     Prior Assessment and Plan Problem List as of 12/09/2013     Cardiovascular and Mediastinum   Essential hypertension, benign   Last  Assessment & Plan   02/10/2011 Office Visit Written 02/10/2011  4:09 PM by Satira Sark, MD     Blood pressure does not appear to be well controlled. While she could have some degree of white coat hypertension, reporting that blood pressure is lower at other times, I still think that advancing medical therapy makes the most sense, particularly in light of her CAD with evidence of cardiomyopathy. I have recommended increasing Cozaar to 50 mg daily. We will continue to review her medications as long as she follows with [place].    ACUT MI SUBENDOCARDIAL INFARCT SUBSQT EPIS CARE   Last Assessment & Plan   02/10/2011 Office Visit Written 02/10/2011  4:12 PM by Satira Sark, MD     Status post placement of four drug-eluting stents to the left anterior descending in January by Dr. Angelena Form in the setting of a non-ST elevation myocardial infarction. Peak troponin I level was 33. I continue to underscore the critical importance of continuing dual antiplatelet therapy. Patient states that she is compliant. No reported bleeding problems.    CORONARY ATHEROSCLEROSIS NATIVE CORONARY ARTERY, hx 4 DES stents to LAD 11/2010   Last Assessment & Plan   02/10/2011 Office Visit Edited 02/10/2011  4:12 PM by Satira Sark, MD     No reported angina at this point. Patient has multivessel CAD based on cardiac catheterization from January, in the setting of an acute coronary syndrome with 99% subtotal occlusion of the left anterior descending at the midportion. 50% circumflex and 60% RCA stenoses also noted with other mild diffuse disease. I continue to recommend aggressive intervention with medical therapy, also diet and exercise. At least at this point, the patient indicates that she wishes to continue to see me for followup.    CARDIOMYOPATHY, ISCHEMIC, EF 40-45% 2012; now with EF 35% at cath 11/25/12   Last Assessment & Plan   02/10/2011 Office Visit Written 02/10/2011  4:10 PM by Satira Sark, MD     Presumably  ischemic cardiomyopathy, although there may have been a component of myocardial stunning at the time of her acute coronary event. Medical therapy should continue, and I again recommend a followup echocardiogram. Perhaps they will agree to further evaluation at next visit.    ST elevation myocardial infarction (STEMI) of anterior wall, 11/25/2013     Other   Mixed hyperlipidemia   Last Assessment & Plan   02/10/2011 Office Visit Written 02/10/2011  4:06 PM by Satira Sark, MD     Copies of original lipid profile during hospitalization with followup numbers were provided today. I made it clear that her LDL was not at goal given her situation. I did encourage them to continue with efforts at diet and exercise, although additionally indicated that it is likely she would need higher dose Lipitor, perhaps a change in statin therapy to get her LDL closer to 70. They (husband in particular) were not comfortable with making any changes at this time, and therefore she will continue on Lipitor 40 mg daily at least at this point.    Acute ST elevation myocardial infarction (STEMI) involving left anterior descending coronary artery       Imaging: Dg Chest Portable 1 View  11/25/2013   CLINICAL DATA:  Chest pain  and shortness of breath.  EXAM: PORTABLE CHEST - 1 VIEW  COMPARISON:  Plain film of the chest 12/09/2010.  FINDINGS: There is cardiomegaly and pulmonary edema. No consolidative process or pneumothorax. No pleural effusion.  IMPRESSION: Cardiomegaly and pulmonary edema.   Electronically Signed   By: Inge Rise M.D.   On: 11/25/2013 14:03

## 2013-12-09 NOTE — Patient Instructions (Signed)
Your physician recommends that you schedule a follow-up appointment in: ONE MONTH WITH Dr. Alexandria Lodge PROVIDER ADVISED PER YOUR HEART FUNCTION OF 20% YOU ARE TO LIMIT OR ELIMINATE THE FOLLOWING:  1) DO NOT DRIVE WHILE WEARING THE LIFE VEST 2) DO NOT DO ANY STRENUOUS ACTIVITY PER THIS WILL MAKE YOU FATIGUED (EXAMPLE: TAKING OUT TRASH, VACUUMING THE FLOOR, NO LIFTING)  3) IF YOU HAVE ANY FURTHER QUESTIONS TO YOUR LIMITED ACTIVITY FEEL FREE TO CONTACT OUR OFFICE AT [phone]

## 2013-12-09 NOTE — Assessment & Plan Note (Signed)
BP is mildly elevated for current systolic function but she is quiet upset today. Will continue on her current medications. See Dr. Domenic Polite in one month.

## 2013-12-09 NOTE — Assessment & Plan Note (Signed)
She is very anxious and concerned about her physical limitations and her fatigue. She is continuing to wear Life Vest and will do so for a minimum of 3 months. I have spent 25 minutes with this patient explaining her current heart condition and medications with activity limitations. She will be followed closely on a monthly basis. Will not titrate up medications at this time as she is not tolerating current doses very well with exceptional fatigue.  She will see Dr. Domenic Polite in one month for ongoing treatment and need to titrate medications if tolerating. She is advised not to drive as long as she is wearing Life Vest, lift heavy objects or do overly exertional activities. This is put in her AVS as well.

## 2013-12-22 ENCOUNTER — Telehealth: Payer: Self-pay | Admitting: Cardiology

## 2013-12-22 NOTE — Telephone Encounter (Signed)
Please see paperwork in refill bin / tgs

## 2013-12-26 NOTE — Telephone Encounter (Signed)
Three faxes for atorvastatin 40 mg #90, losartan 50 mg  #90, metoprolol tart 25 mg #[facility] [phone]

## 2013-12-31 ENCOUNTER — Other Ambulatory Visit: Payer: Self-pay

## 2013-12-31 MED ORDER — ATORVASTATIN CALCIUM 40 MG PO TABS
40.0000 mg | ORAL_TABLET | Freq: Every day | ORAL | Status: DC
Start: 2013-12-31 — End: 2015-02-13

## 2013-12-31 MED ORDER — LOSARTAN POTASSIUM 50 MG PO TABS: 50.0000 mg | ORAL_TABLET | Freq: Every day | ORAL | Status: DC

## 2013-12-31 MED ORDER — METOPROLOL TARTRATE 25 MG PO TABS: 25.0000 mg | ORAL_TABLET | Freq: Two times a day (BID) | ORAL | Status: DC

## 2013-12-31 NOTE — Telephone Encounter (Signed)
Received request for authorization to dispense 90 day supply.  In refill basket on  atorvastatin 40 mg #90, losartan 50 mg #90, metoprolol tart 25 mg #90

## 2013-12-31 NOTE — Telephone Encounter (Signed)
Refill request complete 

## 2014-01-09 ENCOUNTER — Encounter: Payer: Self-pay | Admitting: Cardiology

## 2014-01-09 ENCOUNTER — Ambulatory Visit (INDEPENDENT_AMBULATORY_CARE_PROVIDER_SITE_OTHER): Payer: Medicare Other | Admitting: Cardiology

## 2014-01-09 ENCOUNTER — Encounter (INDEPENDENT_AMBULATORY_CARE_PROVIDER_SITE_OTHER): Payer: Self-pay

## 2014-01-09 VITALS — BP 152/100 | HR 75 | Ht 64.0 in | Wt 150.0 lb

## 2014-01-09 DIAGNOSIS — I255 Ischemic cardiomyopathy: Secondary | ICD-10-CM

## 2014-01-09 DIAGNOSIS — E782 Mixed hyperlipidemia: Secondary | ICD-10-CM

## 2014-01-09 DIAGNOSIS — I10 Essential (primary) hypertension: Secondary | ICD-10-CM

## 2014-01-09 DIAGNOSIS — I2589 Other forms of chronic ischemic heart disease: Secondary | ICD-10-CM

## 2014-01-09 DIAGNOSIS — I2510 Atherosclerotic heart disease of native coronary artery without angina pectoris: Secondary | ICD-10-CM

## 2014-01-09 MED ORDER — CARVEDILOL 6.25 MG PO TABS: 6.2500 mg | ORAL_TABLET | Freq: Two times a day (BID) | ORAL | Status: DC

## 2014-01-09 NOTE — Progress Notes (Signed)
Clinical Summary Debra Doyle is an 80 y.o.female presenting for a followup visit, most recently seen by Ms. Lawrence NP in late January. Records reviewed including anterior STEMI in January due to LAD stent thrombosis off DAPT at the time. She was treated with catheter thrombectomy and angioplasty of the LAD proximal segment (no new stents placed).   The last time I saw Ms. Heuer was in March 2012. At that point the patient's husband was fairly confrontational, personally insulting to me and using vulgarities. They elected to followup with a different cardiologist and she has not been seen in our practice until just recently in the setting of acute illness. Her husband has since passed away. She is here with her son today.  Today we discussed her recent hospitalization, also rationale for using the LifeVest ICD that was placed during her hospitalization. She states that she felt more reassured after our discussion today. Indicates that she has been compliant with her medications. Has been walking for 5-10 minutes at a time, has been somewhat hesitant to go further. She was previously exercising much more regularly. No palpitations or device shocks.  Echocardiogram in January showed mild LVH with LVEF 30-35%, mid to distal anteroseptal, anterior, and anterolateral wall hypokinesis, also mild mitral regurgitation. Lipid panel in January showed cholesterol 176, triglycerides 99, HDL 42, and LDL 114.  No Known Allergies  Current Outpatient Prescriptions  Medication Sig Dispense Refill  . Ascorbic Acid (VITAMIN C) 500 MG tablet Take 500 mg by mouth daily.        Marland Kitchen aspirin 325 MG tablet Take 325 mg by mouth daily.        Marland Kitchen atorvastatin (LIPITOR) 40 MG tablet Take 1 tablet (40 mg total) by mouth daily at 6 PM.  90 tablet  3  . Bilberry 100 MG CAPS Take by mouth daily.        . Cinnamon 500 MG capsule Take 500 mg by mouth daily.        . Glucosamine 500 MG CAPS Take by mouth 2 (two) times daily.         Marland Kitchen losartan (COZAAR) 50 MG tablet Take 1 tablet (50 mg total) by mouth daily.  90 tablet  3  . Magnesium 300 MG CAPS Take by mouth 2 (two) times daily.        . metoprolol tartrate (LOPRESSOR) 25 MG tablet Take 1 tablet (25 mg total) by mouth 2 (two) times daily.  180 tablet  3  . Multiple Vitamin (MULTIVITAMIN) tablet Take 1 tablet by mouth daily.        . nitroGLYCERIN (NITROSTAT) 0.4 MG SL tablet Place 0.4 mg under the tongue every 5 (five) minutes as needed.        . Omega-3 Fatty Acids (FISH OIL) 1000 MG CAPS Take by mouth daily.        Marland Kitchen spironolactone (ALDACTONE) 12.5 mg TABS tablet Take 0.5 tablets (12.5 mg total) by mouth daily.  30 tablet  10  . Ticagrelor (BRILINTA) 90 MG TABS tablet Take 1 tablet (90 mg total) by mouth 2 (two) times daily.  60 tablet  6   No current facility-administered medications for this visit.    Past Medical History  Diagnosis Date  . Coronary atherosclerosis of native coronary artery     DES x 4 to LAD 1/12  . Essential hypertension, benign   . Myocardial infarction     NSTEMI 1/12  . Fibrocystic breast disease   . Hx of  colonic polyps   . Ischemic cardiomyopathy   . ST elevation (STEMI) myocardial infarction involving left anterior descending coronary artery     January 2015 - LAD stent thrombosis off DAPT    Social History Ms. Hafner reports that she has never smoked. She has never used smokeless tobacco. Ms. Kott reports that she does not drink alcohol.  Review of Systems Feels weak at times. Has not regained her energy. Stable appetite. No bleeding problems. No syncope. Otherwise negative.  Physical Examination Filed Vitals:   01/09/14 1354  BP: 152/100  Pulse: 75   Filed Weights   01/09/14 1354  Weight: 150 lb (68.04 kg)   Patient appears comfortable at rest. Currently wearing LifeVest ICD. HEENT: Conjunctiva and lids normal, oropharynx clear. Neck: Supple, no elevated JVP or carotid bruits, no thyromegaly. Lungs: Clear to  auscultation, nonlabored breathing at rest. Cardiac: Regular rate and rhythm, no S3, no pericardial rub. Abdomen: Soft, nontender, bowel sounds present. Extremities: No pitting edema, distal pulses 2+. Skin: Warm and dry. Musculoskeletal: No kyphosis. Neuropsychiatric: Alert and oriented x3, affect grossly appropriate.   Problem List and Plan   Ischemic cardiomyopathy LVEF 30-35% by echocardiogram in the setting of anterior STEMI with LAD thrombosis in January. She continues to wear LifeVest ICD, no palpitations or device shocks. Continue medical therapy, change from Lopressor to Coreg 6.25 mg twice daily. Continue walking regimen. Followup in one month with repeat echocardiogram.  Coronary atherosclerosis of native coronary artery No active angina symptoms. Reinforced importance of DAPT. Continue observation.  Essential hypertension, benign Blood pressure up today. Beta blocker being changed to Coreg. May need to continue further up titration over time. Also reinforced diet and exercise.  Mixed hyperlipidemia On Lipitor 40 mg daily for now. LDL was 114 during hospitalization in January. Goal should be closer to 70.    Satira Sark, M.D., F.A.C.C.

## 2014-01-09 NOTE — Assessment & Plan Note (Signed)
On Lipitor 40 mg daily for now. LDL was 114 during hospitalization in January. Goal should be closer to 70.

## 2014-01-09 NOTE — Assessment & Plan Note (Signed)
LVEF 30-35% by echocardiogram in the setting of anterior STEMI with LAD thrombosis in January. She continues to wear LifeVest ICD, no palpitations or device shocks. Continue medical therapy, change from Lopressor to Coreg 6.25 mg twice daily. Continue walking regimen. Followup in one month with repeat echocardiogram.

## 2014-01-09 NOTE — Addendum Note (Signed)
Addended by: Barbarann Ehlers A on: 01/09/2014 02:39 PM   Modules accepted: Orders, Medications

## 2014-01-09 NOTE — Assessment & Plan Note (Signed)
No active angina symptoms. Reinforced importance of DAPT. Continue observation.

## 2014-01-09 NOTE — Patient Instructions (Addendum)
Your physician recommends that you schedule a follow-up appointment in: 1 one month    Your physician has recommended you make the following change in your medication:    STOP Lopressor and START Coreg 6.25g twice a day   Your physician has requested that you have an echocardiogram,schedulue echo in next month just BEFORE follow up visit with doctor. Echocardiography is a painless test that uses sound waves to create images of your heart. It provides your doctor with information about the size and shape of your heart and how well your heart's chambers and valves are working. This procedure takes approximately one hour. There are no restrictions for this procedure.  Thank you for choosing [facility] !

## 2014-01-09 NOTE — Assessment & Plan Note (Signed)
Blood pressure up today. Beta blocker being changed to Coreg. May need to continue further up titration over time. Also reinforced diet and exercise.

## 2014-01-26 ENCOUNTER — Ambulatory Visit (HOSPITAL_COMMUNITY)
Admission: RE | Admit: 2014-01-26 | Discharge: 2014-01-26 | Disposition: A | Discharge status: Home / Self Care | Payer: Medicare Other | Source: Ambulatory Visit | Attending: Cardiology | Admitting: Cardiology

## 2014-01-26 DIAGNOSIS — E785 Hyperlipidemia, unspecified: Secondary | ICD-10-CM | POA: Insufficient documentation

## 2014-01-26 DIAGNOSIS — I059 Rheumatic mitral valve disease, unspecified: Secondary | ICD-10-CM | POA: Insufficient documentation

## 2014-01-26 DIAGNOSIS — I10 Essential (primary) hypertension: Secondary | ICD-10-CM | POA: Insufficient documentation

## 2014-01-26 DIAGNOSIS — I2510 Atherosclerotic heart disease of native coronary artery without angina pectoris: Secondary | ICD-10-CM | POA: Insufficient documentation

## 2014-01-26 DIAGNOSIS — I219 Acute myocardial infarction, unspecified: Secondary | ICD-10-CM | POA: Insufficient documentation

## 2014-01-26 NOTE — Progress Notes (Signed)
*  PRELIMINARY RESULTS* Echocardiogram 2D Echocardiogram has been performed.  [place], [place] 01/26/2014, 1:55 PM

## 2014-02-06 ENCOUNTER — Ambulatory Visit (INDEPENDENT_AMBULATORY_CARE_PROVIDER_SITE_OTHER): Payer: Medicare Other | Admitting: Cardiology

## 2014-02-06 ENCOUNTER — Encounter: Payer: Self-pay | Admitting: Cardiology

## 2014-02-06 VITALS — BP 142/88 | HR 74 | Ht 63.0 in | Wt 148.0 lb

## 2014-02-06 DIAGNOSIS — I2510 Atherosclerotic heart disease of native coronary artery without angina pectoris: Secondary | ICD-10-CM

## 2014-02-06 DIAGNOSIS — I2589 Other forms of chronic ischemic heart disease: Secondary | ICD-10-CM

## 2014-02-06 DIAGNOSIS — I255 Ischemic cardiomyopathy: Secondary | ICD-10-CM

## 2014-02-06 DIAGNOSIS — E782 Mixed hyperlipidemia: Secondary | ICD-10-CM

## 2014-02-06 DIAGNOSIS — E7800 Pure hypercholesterolemia, unspecified: Secondary | ICD-10-CM

## 2014-02-06 DIAGNOSIS — I10 Essential (primary) hypertension: Secondary | ICD-10-CM

## 2014-02-06 NOTE — Assessment & Plan Note (Signed)
LVEF improved to the range of 40-45% based on recent followup echocardiogram. LifeVest has been discontinued.

## 2014-02-06 NOTE — Assessment & Plan Note (Signed)
Symptomatically stable on medical therapy. No changes made today. Increase activity as discussed above. Followup in 3 months.

## 2014-02-06 NOTE — Assessment & Plan Note (Signed)
Continue Lipitor. Followup FLP and LFT for next visit.

## 2014-02-06 NOTE — Patient Instructions (Signed)
Your physician recommends that you schedule a follow-up appointment in: 3 months  Your physician recommends that you continue on your current medications as directed. Please refer to the Current Medication list given to you today.   Please have blood work done before next visit   Thank you for choosing [facility] !

## 2014-02-06 NOTE — Assessment & Plan Note (Signed)
Blood pressure mildly increased. Continue medications, increase activity. Keep follow up with Dr. Manuella Ghazi.

## 2014-02-06 NOTE — Progress Notes (Signed)
Clinical Summary Debra Doyle is an 80 y.o.female last seen in February of this year. Extensive records reviewed at the last visit with uptake in clinical status. She is here with her son today. Denies any chest pain, has had no palpitations or syncope. Has been using a stationary bicycle at home for approximately 5 minutes at a time, a few times a day. She asked about increasing her exercise. We reviewed the results of her followup echocardiogram which showed improvement in LVEF.  Recent followup echocardiogram done this month demonstrated mild LVH with LVEF 40-45%, wall motion abnormalities consistent with ischemic cardiomyopathy, grade 1 diastolic dysfunction, mildly sclerotic aortic valve, MAC with mild to moderate mitral regurgitation, mild tricuspid regurgitation, PASP 32 mm mercury. LifeVest was discontinued.  She reports compliance with her medications. Reviewed the importance of continuing therapy. Also reasonable to increase her activity at this time. Previously she had been exercising at the [facility] which could be a good goal for her to achieve again.  No Known Allergies  Current Outpatient Prescriptions  Medication Sig Dispense Refill  . Ascorbic Acid (VITAMIN C) 500 MG tablet Take 500 mg by mouth daily.        Marland Kitchen aspirin 325 MG tablet Take 325 mg by mouth daily.        Marland Kitchen atorvastatin (LIPITOR) 40 MG tablet Take 1 tablet (40 mg total) by mouth daily at 6 PM.  90 tablet  3  . Bilberry 100 MG CAPS Take by mouth daily.        . carvedilol (COREG) 6.25 MG tablet Take 1 tablet (6.25 mg total) by mouth 2 (two) times daily.  180 tablet  3  . Cinnamon 500 MG capsule Take 500 mg by mouth daily.        . Glucosamine 500 MG CAPS Take by mouth 2 (two) times daily.        Marland Kitchen losartan (COZAAR) 50 MG tablet Take 1 tablet (50 mg total) by mouth daily.  90 tablet  3  . Magnesium 300 MG CAPS Take by mouth 2 (two) times daily.        . Multiple Vitamin (MULTIVITAMIN) tablet Take 1 tablet by mouth daily.         . nitroGLYCERIN (NITROSTAT) 0.4 MG SL tablet Place 0.4 mg under the tongue every 5 (five) minutes as needed.        . Omega-3 Fatty Acids (FISH OIL) 1000 MG CAPS Take by mouth daily.        Marland Kitchen spironolactone (ALDACTONE) 12.5 mg TABS tablet Take 0.5 tablets (12.5 mg total) by mouth daily.  30 tablet  10  . Ticagrelor (BRILINTA) 90 MG TABS tablet Take 1 tablet (90 mg total) by mouth 2 (two) times daily.  60 tablet  6   No current facility-administered medications for this visit.    Past Medical History  Diagnosis Date  . Coronary atherosclerosis of native coronary artery     DES x 4 to LAD 1/12  . Essential hypertension, benign   . Myocardial infarction     NSTEMI 1/12  . Fibrocystic breast disease   . Hx of colonic polyps   . Ischemic cardiomyopathy   . ST elevation (STEMI) myocardial infarction involving left anterior descending coronary artery     January 2015 - LAD stent thrombosis off DAPT    Social History Debra Doyle reports that she has never smoked. She has never used smokeless tobacco. Debra Doyle reports that she does not drink alcohol.  Review of Systems Negative except as outlined.  Physical Examination Filed Vitals:   02/06/14 1300  BP: 142/88  Pulse: 74   Filed Weights   02/06/14 1300  Weight: 148 lb (67.132 kg)    Patient appears comfortable at rest.  HEENT: Conjunctiva and lids normal, oropharynx clear.  Neck: Supple, no elevated JVP or carotid bruits, no thyromegaly.  Lungs: Clear to auscultation, nonlabored breathing at rest.  Cardiac: Regular rate and rhythm, no S3, no pericardial rub.  Abdomen: Soft, nontender, bowel sounds present.  Extremities: No pitting edema, distal pulses 2+.  Skin: Warm and dry.  Musculoskeletal: No kyphosis.  Neuropsychiatric: Alert and oriented x3, affect grossly appropriate.   Problem List and Plan   Coronary atherosclerosis of native coronary artery Symptomatically stable on medical therapy. No changes made  today. Increase activity as discussed above. Followup in 3 months.  Ischemic cardiomyopathy LVEF improved to the range of 40-45% based on recent followup echocardiogram. LifeVest has been discontinued.  Mixed hyperlipidemia Continue Lipitor. Followup FLP and LFT for next visit.  Essential hypertension, benign Blood pressure mildly increased. Continue medications, increase activity. Keep follow up with Dr. Manuella Ghazi.    Satira Sark, M.D., F.A.C.C.

## 2014-03-10 ENCOUNTER — Telehealth: Payer: Self-pay | Admitting: Cardiology

## 2014-03-10 NOTE — Telephone Encounter (Signed)
faxed

## 2014-03-10 NOTE — Telephone Encounter (Signed)
Please see paper in refill bin / tgs  °

## 2014-05-12 ENCOUNTER — Encounter: Payer: Self-pay | Admitting: Cardiology

## 2014-05-22 ENCOUNTER — Ambulatory Visit (INDEPENDENT_AMBULATORY_CARE_PROVIDER_SITE_OTHER): Payer: Medicare Other | Admitting: Cardiology

## 2014-05-22 ENCOUNTER — Encounter: Payer: Self-pay | Admitting: Cardiology

## 2014-05-22 VITALS — BP 142/90 | HR 85 | Ht 63.0 in | Wt 149.0 lb

## 2014-05-22 DIAGNOSIS — E782 Mixed hyperlipidemia: Secondary | ICD-10-CM

## 2014-05-22 DIAGNOSIS — I255 Ischemic cardiomyopathy: Secondary | ICD-10-CM

## 2014-05-22 DIAGNOSIS — I2589 Other forms of chronic ischemic heart disease: Secondary | ICD-10-CM

## 2014-05-22 DIAGNOSIS — I10 Essential (primary) hypertension: Secondary | ICD-10-CM

## 2014-05-22 DIAGNOSIS — I2510 Atherosclerotic heart disease of native coronary artery without angina pectoris: Secondary | ICD-10-CM

## 2014-05-22 NOTE — Assessment & Plan Note (Signed)
LVEF improved to the range of 40-45%  Continue medical therapy.

## 2014-05-22 NOTE — Patient Instructions (Signed)
Your physician wants you to follow-up in: 6 months with Dr. McDowell You will receive a reminder letter in the mail two months in advance. If you don't receive a letter, please call our office to schedule the follow-up appointment.  Your physician recommends that you continue on your current medications as directed. Please refer to the Current Medication list given to you today.  Thank you for choosing [facility]!!    

## 2014-05-22 NOTE — Progress Notes (Signed)
/    Clinical Summary Debra Doyle is an 80 y.o.female last seen in March of this year. She continues to do well, no recurrent angina or unusual shortness of breath with typical ADLs. She has not been exercising as regularly, and we discussed this today. Walking seems to bother her feet, so we talked about using a stationary bicycle which he has tolerated.  She reports compliance with her medications, no bleeding problems. She has not required any nitroglycerin.  Lab work from May showed BUN 13, creatinine 0.6, potassium 4.6, normal LFTs, hemoglobin 14.4, platelets 238, cholesterol 126, triglycerides 85, HDL 46, LDL 63, TSH 1.2.  Followup echocardiogram done in March demonstrated mild LVH with LVEF 40-45%, wall motion abnormalities consistent with ischemic cardiomyopathy, grade 1 diastolic dysfunction, mildly sclerotic aortic valve, MAC with mild to moderate mitral regurgitation, mild tricuspid regurgitation, PASP 32 mm mercury.    No Known Allergies  Current Outpatient Prescriptions  Medication Sig Dispense Refill  . Ascorbic Acid (VITAMIN C) 500 MG tablet Take 500 mg by mouth daily.        Marland Kitchen aspirin 325 MG tablet Take 325 mg by mouth daily.        Marland Kitchen atorvastatin (LIPITOR) 40 MG tablet Take 1 tablet (40 mg total) by mouth daily at 6 PM.  90 tablet  3  . Bilberry 100 MG CAPS Take by mouth daily.        . carvedilol (COREG) 6.25 MG tablet Take 1 tablet (6.25 mg total) by mouth 2 (two) times daily.  180 tablet  3  . Cinnamon 500 MG capsule Take 500 mg by mouth daily.        . Glucosamine 500 MG CAPS Take by mouth 2 (two) times daily.        Marland Kitchen losartan (COZAAR) 50 MG tablet Take 1 tablet (50 mg total) by mouth daily.  90 tablet  3  . Magnesium 300 MG CAPS Take by mouth 2 (two) times daily.        . Multiple Vitamin (MULTIVITAMIN) tablet Take 1 tablet by mouth daily.        . nitroGLYCERIN (NITROSTAT) 0.4 MG SL tablet Place 0.4 mg under the tongue every 5 (five) minutes as needed.        .  Omega-3 Fatty Acids (FISH OIL) 1000 MG CAPS Take by mouth daily.        Marland Kitchen spironolactone (ALDACTONE) 12.5 mg TABS tablet Take 0.5 tablets (12.5 mg total) by mouth daily.  30 tablet  10  . Ticagrelor (BRILINTA) 90 MG TABS tablet Take 1 tablet (90 mg total) by mouth 2 (two) times daily.  60 tablet  6   No current facility-administered medications for this visit.    Past Medical History  Diagnosis Date  . Coronary atherosclerosis of native coronary artery     DES x 4 to LAD 1/12  . Essential hypertension, benign   . Myocardial infarction     NSTEMI 1/12  . Fibrocystic breast disease   . Hx of colonic polyps   . Ischemic cardiomyopathy   . ST elevation (STEMI) myocardial infarction involving left anterior descending coronary artery     January 2015 - LAD stent thrombosis off DAPT    Social History Debra Doyle reports that she has never smoked. She has never used smokeless tobacco. Debra Doyle reports that she does not drink alcohol.  Review of Systems No fevers or chills, no change in appetite, no melena or hematochezia. Other systems reviewed and  negative.  Physical Examination Filed Vitals:   05/22/14 1354  BP: 142/90  Pulse: 85   Filed Weights   05/22/14 1354  Weight: 149 lb (67.586 kg)    Patient appears comfortable at rest.  HEENT: Conjunctiva and lids normal, oropharynx clear.  Neck: Supple, no elevated JVP or carotid bruits, no thyromegaly.  Lungs: Clear to auscultation, nonlabored breathing at rest.  Cardiac: Regular rate and rhythm, no S3, no pericardial rub.  Abdomen: Soft, nontender, bowel sounds present.  Extremities: No pitting edema, distal pulses 2+.  Skin: Warm and dry.  Musculoskeletal: No kyphosis.  Neuropsychiatric: Alert and oriented x3, affect grossly appropriate.   Problem List and Plan   Coronary atherosclerosis of native coronary artery Symptomatically stable on present medical regimen. No changes were made today. I encouraged her to use her  stationary bicycle for exercise. Followup in 6 months.  Ischemic cardiomyopathy LVEF improved to the range of 40-45%  Continue medical therapy.  Mixed hyperlipidemia Recent LDL 63 on Lipitor.  Essential hypertension, benign Blood pressure mildly elevated today. She reports compliance with her medications. Discussed sodium restriction and exercise regimen.    Satira Sark, M.D., F.A.C.C.

## 2014-05-22 NOTE — Assessment & Plan Note (Signed)
Symptomatically stable on present medical regimen. No changes were made today. I encouraged her to use her stationary bicycle for exercise. Followup in 6 months.

## 2014-05-22 NOTE — Assessment & Plan Note (Signed)
Recent LDL 63 on Lipitor.

## 2014-05-22 NOTE — Assessment & Plan Note (Signed)
Blood pressure mildly elevated today. She reports compliance with her medications. Discussed sodium restriction and exercise regimen.

## 2014-10-22 ENCOUNTER — Encounter (HOSPITAL_COMMUNITY): Payer: Self-pay | Admitting: Interventional Cardiology

## 2014-11-25 ENCOUNTER — Ambulatory Visit (INDEPENDENT_AMBULATORY_CARE_PROVIDER_SITE_OTHER): Payer: Medicare Other | Admitting: Cardiology

## 2014-11-25 ENCOUNTER — Encounter: Payer: Self-pay | Admitting: Cardiology

## 2014-11-25 VITALS — BP 135/80 | HR 70 | Ht 63.5 in | Wt 152.0 lb

## 2014-11-25 DIAGNOSIS — I255 Ischemic cardiomyopathy: Secondary | ICD-10-CM

## 2014-11-25 DIAGNOSIS — I2510 Atherosclerotic heart disease of native coronary artery without angina pectoris: Secondary | ICD-10-CM

## 2014-11-25 DIAGNOSIS — I10 Essential (primary) hypertension: Secondary | ICD-10-CM

## 2014-11-25 MED ORDER — ASPIRIN EC 81 MG PO TBEC: 81.0000 mg | DELAYED_RELEASE_TABLET | Freq: Every day | ORAL | Status: DC

## 2014-11-25 MED ORDER — CLOPIDOGREL BISULFATE 75 MG PO TABS: 75.0000 mg | ORAL_TABLET | Freq: Every day | ORAL | Status: DC

## 2014-11-25 NOTE — Progress Notes (Signed)
Reason for visit: CAD  Clinical Summary Debra Doyle is an 81 y.o.female last seen in July 2015. She comes in for a routine visit today, one year out from her anterior STEMI with DES to the LAD. She reports no angina symptoms. Has been exercising at the [facility] 3 days a week, also joining a tai chi class. ECG today shows sinus rhythm with IVCD, left bundle branch block pattern. She reports compliance with her medications including aspirin, Brilinta, Aldactone, Cozaar, Coreg, and Lipitor. She continues to follow with Dr. Manuella Ghazi for primary care and lab work.  Echocardiogram done in March 2015 demonstrated mild LVH with LVEF 40-45%, wall motion abnormalities consistent with ischemic cardiomyopathy, grade 1 diastolic dysfunction, mildly sclerotic aortic valve, MAC with mild to moderate mitral regurgitation, mild tricuspid regurgitation, PASP 32 mm mercury.   No Known Allergies  Current Outpatient Prescriptions  Medication Sig Dispense Refill  . Ascorbic Acid (VITAMIN C) 500 MG tablet Take 500 mg by mouth daily.      Marland Kitchen aspirin 325 MG tablet Take 325 mg by mouth daily.      Marland Kitchen atorvastatin (LIPITOR) 40 MG tablet Take 1 tablet (40 mg total) by mouth daily at 6 PM. 90 tablet 3  . Bilberry 100 MG CAPS Take by mouth daily.      . carvedilol (COREG) 6.25 MG tablet Take 1 tablet (6.25 mg total) by mouth 2 (two) times daily. 180 tablet 3  . Cinnamon 500 MG capsule Take 500 mg by mouth daily.      . clopidogrel (PLAVIX) 75 MG tablet Take 1 tablet (75 mg total) by mouth daily. 90 tablet 3  . Glucosamine 500 MG CAPS Take by mouth 2 (two) times daily.      Marland Kitchen losartan (COZAAR) 50 MG tablet Take 1 tablet (50 mg total) by mouth daily. 90 tablet 3  . Magnesium 300 MG CAPS Take by mouth 2 (two) times daily.      . Multiple Vitamin (MULTIVITAMIN) tablet Take 1 tablet by mouth daily.      . nitroGLYCERIN (NITROSTAT) 0.4 MG SL tablet Place 0.4 mg under the tongue every 5 (five) minutes as needed.      . Omega-3 Fatty  Acids (FISH OIL) 1000 MG CAPS Take by mouth daily.      Marland Kitchen spironolactone (ALDACTONE) 12.5 mg TABS tablet Take 0.5 tablets (12.5 mg total) by mouth daily. 30 tablet 10  . Ticagrelor (BRILINTA) 90 MG TABS tablet Take 1 tablet (90 mg total) by mouth 2 (two) times daily. 60 tablet 6   No current facility-administered medications for this visit.    Past Medical History  Diagnosis Date  . Coronary atherosclerosis of native coronary artery     DES x 4 to LAD 1/12  . Essential hypertension, benign   . Myocardial infarction     NSTEMI 1/12  . Fibrocystic breast disease   . Hx of colonic polyps   . Ischemic cardiomyopathy   . ST elevation (STEMI) myocardial infarction involving left anterior descending coronary artery     January 2015 - LAD stent thrombosis off DAPT    Social History Debra Doyle reports that she has never smoked. She has never used smokeless tobacco. Debra Doyle reports that she does not drink alcohol.  Review of Systems Complete review of systems negative except as otherwise outlined in the clinical summary and also the following. No palpitations or syncope. Occasional "panic attacks." No orthopnea or PND.  Physical Examination Filed Vitals:   11/25/14  1112  BP: 135/80  Pulse: 70   Filed Weights   11/25/14 1049  Weight: 152 lb (68.947 kg)    Patient appears comfortable at rest.  HEENT: Conjunctiva and lids normal, oropharynx clear.  Neck: Supple, no elevated JVP or carotid bruits, no thyromegaly.  Lungs: Clear to auscultation, nonlabored breathing at rest.  Cardiac: Regular rate and rhythm, no S3, no pericardial rub.  Abdomen: Soft, nontender, bowel sounds present.  Extremities: No pitting edema, distal pulses 2+.  Skin: Warm and dry.  Musculoskeletal: No kyphosis.  Neuropsychiatric: Alert and oriented x3, affect grossly appropriate.   Problem List and Plan   Coronary atherosclerosis of native coronary artery No active angina symptoms now one year  out from anterior STEMI with DES to LAD. She has had prior interventions to the LAD in 2012, multiple DES sites. Her most recent presentation was stent thrombosis after coming off aspirin and Plavix. At this time she will stop Brilinta, and we will transition to aspirin and Plavix as a long-term regimen. Continue regular exercise plan. Follow-up in 6 months, sooner if needed.   Ischemic cardiomyopathy LVEF 40-45% range by follow-up echocardiogram last year. Continue current medical regimen.   Mixed hyperlipidemia Continue on Lipitor. Keep follow-up with Dr. Manuella Ghazi for lab work.     Satira Sark, M.D., F.A.C.C.

## 2014-11-25 NOTE — Assessment & Plan Note (Signed)
LVEF 40-45% range by follow-up echocardiogram last year. Continue current medical regimen.

## 2014-11-25 NOTE — Assessment & Plan Note (Signed)
Continue on Lipitor. Keep follow-up with Dr. Manuella Ghazi for lab work.

## 2014-11-25 NOTE — Assessment & Plan Note (Signed)
No active angina symptoms now one year out from anterior STEMI with DES to LAD. She has had prior interventions to the LAD in 2012, multiple DES sites. Her most recent presentation was stent thrombosis after coming off aspirin and Plavix. At this time she will stop Brilinta, and we will transition to aspirin and Plavix as a long-term regimen. Continue regular exercise plan. Follow-up in 6 months, sooner if needed.

## 2014-11-25 NOTE — Patient Instructions (Addendum)
Your physician wants you to follow-up in: 6 Months with Dr. Domenic Polite. You will receive a reminder letter in the mail two months in advance. If you don't receive a letter, please call our office to schedule the follow-up appointment.   Your physician has recommended you make the following change in your medication:   Stop:   Brilinta after current supply  Start:  Plavix 75 mg daily           Aspirin 81 mg daily Thank you for choosing [facility]!

## 2014-11-27 NOTE — Addendum Note (Signed)
Addended by: Levonne Hubert on: 11/27/2014 09:31 AM   Modules accepted: Orders

## 2014-11-30 ENCOUNTER — Other Ambulatory Visit: Payer: Self-pay | Admitting: Adult Health

## 2014-12-18 ENCOUNTER — Other Ambulatory Visit: Payer: Self-pay | Admitting: Cardiology

## 2014-12-19 ENCOUNTER — Other Ambulatory Visit: Payer: Self-pay | Admitting: Adult Health

## 2015-01-04 ENCOUNTER — Telehealth: Payer: Self-pay | Admitting: *Deleted

## 2015-01-04 NOTE — Telephone Encounter (Signed)
Pt states she had a change in medication from [place] and has been having panic attacks, she wants to know if it could be related.

## 2015-02-13 ENCOUNTER — Other Ambulatory Visit: Payer: Self-pay | Admitting: Cardiology

## 2015-07-02 ENCOUNTER — Encounter: Payer: Self-pay | Admitting: Cardiology

## 2015-07-02 ENCOUNTER — Ambulatory Visit (INDEPENDENT_AMBULATORY_CARE_PROVIDER_SITE_OTHER): Payer: Medicare Other | Admitting: Cardiology

## 2015-07-02 VITALS — BP 128/70 | HR 74 | Ht 63.5 in | Wt 154.1 lb

## 2015-07-02 DIAGNOSIS — I2510 Atherosclerotic heart disease of native coronary artery without angina pectoris: Secondary | ICD-10-CM

## 2015-07-02 DIAGNOSIS — I255 Ischemic cardiomyopathy: Secondary | ICD-10-CM | POA: Diagnosis not present

## 2015-07-02 DIAGNOSIS — E782 Mixed hyperlipidemia: Secondary | ICD-10-CM | POA: Diagnosis not present

## 2015-07-02 NOTE — Progress Notes (Signed)
Cardiology Office Note  Date: 07/02/2015   ID: [name], DOB September 12, 1929, MRN [id number]  PCP: [name], MD  Primary Cardiologist: Rozann Lesches, MD   Chief Complaint  Patient presents with  . Coronary Artery Disease  . Cardiomyopathy    History of Present Illness: Debra Doyle is an 81 y.o. female last seen in January. She presents for a routine visit today. No significant angina symptoms since last encounter, reports NYHA class II dyspnea with typical activities. She is exercising, does a Pilates class at least once a week and walking.  We reviewed her medications in detail. She reports compliance with aspirin and Plavix, also remains on Lipitor. She does have intermittent bruising but no major bleeding problems.  She continues to follow with Dr. Manuella Ghazi.   Past Medical History  Diagnosis Date  . Coronary atherosclerosis of native coronary artery     DES x 4 to LAD 1/12  . Essential hypertension, benign   . Myocardial infarction     NSTEMI 1/12  . Fibrocystic breast disease   . Hx of colonic polyps   . Ischemic cardiomyopathy   . ST elevation (STEMI) myocardial infarction involving left anterior descending coronary artery     January 2015 - LAD stent thrombosis off DAPT    Current Outpatient Prescriptions  Medication Sig Dispense Refill  . Ascorbic Acid (VITAMIN C) 500 MG tablet Take 500 mg by mouth daily.      Marland Kitchen aspirin EC 81 MG tablet Take 1 tablet (81 mg total) by mouth daily.    Marland Kitchen atorvastatin (LIPITOR) 40 MG tablet TAKE 1 TABLET (40 MG TOTAL) BY MOUTH DAILY AT 6 PM. 90 tablet 2  . Bilberry 100 MG CAPS Take by mouth daily.      . carvedilol (COREG) 6.25 MG tablet TAKE 1 TABLET TWICE A DAY 180 tablet 3  . Cinnamon 500 MG capsule Take 500 mg by mouth daily.      . clopidogrel (PLAVIX) 75 MG tablet Take 1 tablet (75 mg total) by mouth daily. 90 tablet 3  . Glucosamine 500 MG CAPS Take by mouth 2 (two) times daily.      Marland Kitchen losartan (COZAAR) 50 MG tablet TAKE 1  TABLET EVERY DAY 30 tablet 12  . Magnesium 300 MG CAPS Take by mouth 2 (two) times daily.      . Multiple Vitamin (MULTIVITAMIN) tablet Take 1 tablet by mouth daily.      . nitroGLYCERIN (NITROSTAT) 0.4 MG SL tablet Place 0.4 mg under the tongue every 5 (five) minutes as needed.      . Omega-3 Fatty Acids (FISH OIL) 1000 MG CAPS Take by mouth daily.       No current facility-administered medications for this visit.    Allergies:  Review of patient's allergies indicates no known allergies.   Social History: The patient  reports that she has never smoked. She has never used smokeless tobacco. She reports that she does not drink alcohol or use illicit drugs.    ROS:  Please see the history of present illness. Otherwise, complete review of systems is positive for none.  All other systems are reviewed and negative.   Physical Exam: VS:  BP 128/70 mmHg  Pulse 74  Ht 5' 3.5" (1.613 m)  Wt 154 lb 1.9 oz (69.908 kg)  BMI 26.87 kg/m2  SpO2 98%, BMI Body mass index is 26.87 kg/(m^2).  Wt Readings from Last 3 Encounters:  07/02/15 154 lb 1.9 oz (69.908 kg)  11/25/14 152 lb (68.947 kg)  05/22/14 149 lb (67.586 kg)     Patient appears comfortable at rest.  HEENT: Conjunctiva and lids normal, oropharynx clear.  Neck: Supple, no elevated JVP or carotid bruits, no thyromegaly.  Lungs: Clear to auscultation, nonlabored breathing at rest.  Cardiac: Regular rate and rhythm, no S3, no pericardial rub.  Abdomen: Soft, nontender, bowel sounds present.  Extremities: No pitting edema, distal pulses 2+. Few resolving ecchymoses on her arms. Skin: Warm and dry.  Musculoskeletal: No kyphosis.  Neuropsychiatric: Alert and oriented x3, affect grossly appropriate.   ECG: ECG is not ordered today.   Recent Labwork: No results found for requested labs within last 365 days.     Component Value Date/Time   CHOL 176 11/26/2013 0223   TRIG 99 11/26/2013 0223   HDL 42 11/26/2013 0223   CHOLHDL  4.2 11/26/2013 0223   VLDL 20 11/26/2013 0223   LDLCALC 114* 11/26/2013 0223    Other Studies Reviewed Today:  Echocardiogram done in March 2015 demonstrated mild LVH with LVEF 40-45%, wall motion abnormalities consistent with ischemic cardiomyopathy, grade 1 diastolic dysfunction, mildly sclerotic aortic valve, MAC with mild to moderate mitral regurgitation, mild tricuspid regurgitation, PASP 32 mm mercury.   Assessment and Plan:  1. Symptomatically stable CAD, reports compliance with medical therapy. We are continuing long-term DAPT with multiple DES sites in the LAD and documented stent thrombosis. LVEF 40-45% by echocardiogram last year without active heart failure symptoms. I have recommended continue diet and exercise.  2. Hyperlipidemia, continues on Lipitor and follow-up with Dr. Manuella Ghazi.  Current medicines were reviewed with the patient today.  Disposition: FU with me in 6 months.   Signed, Satira Sark, MD, [facility] 07/02/2015 2:08 PM    [facility] [address], [place], [place] [address] Phone: [phone]; Fax: [phone]

## 2015-07-02 NOTE — Patient Instructions (Signed)
Your physician wants you to follow-up in: 6 months with Dr.McDowell You will receive a reminder letter in the mail two months in advance. If you don't receive a letter, please call our office to schedule the follow-up appointment.     Your physician recommends that you continue on your current medications as directed. Please refer to the Current Medication list given to you today.     Thank you for choosing [facility] !        

## 2015-12-09 ENCOUNTER — Other Ambulatory Visit: Payer: Self-pay | Admitting: Adult Health

## 2015-12-11 ENCOUNTER — Other Ambulatory Visit: Payer: Self-pay | Admitting: Cardiology

## 2015-12-19 ENCOUNTER — Other Ambulatory Visit: Payer: Self-pay | Admitting: Cardiology

## 2015-12-19 ENCOUNTER — Other Ambulatory Visit: Payer: Self-pay | Admitting: Adult Health

## 2015-12-29 ENCOUNTER — Other Ambulatory Visit: Payer: Self-pay | Admitting: Cardiology

## 2015-12-30 ENCOUNTER — Encounter: Payer: Self-pay | Admitting: *Deleted

## 2015-12-30 ENCOUNTER — Ambulatory Visit (INDEPENDENT_AMBULATORY_CARE_PROVIDER_SITE_OTHER): Payer: PPO | Admitting: Cardiology

## 2015-12-30 ENCOUNTER — Encounter: Payer: Self-pay | Admitting: Cardiology

## 2015-12-30 VITALS — BP 138/100 | HR 77 | Ht 63.0 in | Wt 151.0 lb

## 2015-12-30 DIAGNOSIS — I10 Essential (primary) hypertension: Secondary | ICD-10-CM

## 2015-12-30 DIAGNOSIS — I255 Ischemic cardiomyopathy: Secondary | ICD-10-CM | POA: Diagnosis not present

## 2015-12-30 DIAGNOSIS — I2510 Atherosclerotic heart disease of native coronary artery without angina pectoris: Secondary | ICD-10-CM

## 2015-12-30 DIAGNOSIS — E782 Mixed hyperlipidemia: Secondary | ICD-10-CM

## 2015-12-30 NOTE — Progress Notes (Signed)
Cardiology Office Note  Date: 12/30/2015   ID: [name], DOB 1929-07-30, MRN [id number]  PCP: [name], MD  Primary Cardiologist: Rozann Lesches, MD   Chief Complaint  Patient presents with  . Coronary Artery Disease    History of Present Illness: Debra Doyle is an 82 y.o. female last seen in August 2016. She presents for a routine follow-up visit. She does not report any angina symptoms or nitroglycerin use. She has not been exercising as regularly. She reports NYHA class II dyspnea, no palpitations or syncope.  I reviewed her medications. Cardiac regimen includes aspirin, Plavix, Coreg, Lipitor, Cozaar, Aldactone, and as needed nitroglycerin. ECG today shows sinus rhythm with left bundle branch block.  She reports having lab work with Dr. Manuella Ghazi back in November 2016, I am requesting records for review. Her LDL was 114 back in 2015.  Reviewed her last echocardiogram report, LVEF 40-45% as of March 2015. We discussed obtaining an updated study.  Blood pressure was elevated today, particular diastolic. I rechecked and found diastolic to be down in the 90s. We talked about increasing her walking regimen, watching sodium in the diet. She does have further room for up titration of Cozaar if necessary.   Past Medical History  Diagnosis Date  . Coronary atherosclerosis of native coronary artery     DES x 4 to LAD 1/12  . Essential hypertension, benign   . Myocardial infarction [facility])     NSTEMI 1/12  . Fibrocystic breast disease   . Hx of colonic polyps   . Ischemic cardiomyopathy   . ST elevation (STEMI) myocardial infarction involving left anterior descending coronary artery [facility])     January 2015 - LAD stent thrombosis off DAPT    Past Surgical History  Procedure Laterality Date  . Cataract extraction, bilateral      Bilateral  . Left heart catheterization with coronary angiogram N/A 11/25/2013    Procedure: LEFT HEART CATHETERIZATION WITH CORONARY ANGIOGRAM;   Surgeon: Sinclair Grooms, MD;  Location: [facility] CATH LAB;  Service: Cardiovascular;  Laterality: N/A;  . Percutaneous coronary stent intervention (pci-s)  11/25/2013    Procedure: PERCUTANEOUS CORONARY STENT INTERVENTION (PCI-S);  Surgeon: Sinclair Grooms, MD;  Location: [facility] CATH LAB;  Service: Cardiovascular;;    Current Outpatient Prescriptions  Medication Sig Dispense Refill  . Ascorbic Acid (VITAMIN C) 500 MG tablet Take 500 mg by mouth daily.      Marland Kitchen aspirin EC 81 MG tablet Take 1 tablet (81 mg total) by mouth daily.    Marland Kitchen atorvastatin (LIPITOR) 40 MG tablet TAKE 1 TABLET (40 MG TOTAL) BY MOUTH DAILY AT 6 PM. 90 tablet 2  . Bilberry 100 MG CAPS Take by mouth daily.      . carvedilol (COREG) 6.25 MG tablet TAKE 1 TABLET TWICE A DAY 180 tablet 3  . Cinnamon 500 MG capsule Take 500 mg by mouth daily.      . clopidogrel (PLAVIX) 75 MG tablet TAKE 1 TABLET (75 MG TOTAL) BY MOUTH DAILY. 90 tablet 3  . Glucosamine 500 MG CAPS Take by mouth 2 (two) times daily.      Marland Kitchen losartan (COZAAR) 50 MG tablet TAKE 1 TABLET BY MOUTH EVERY DAY 30 tablet 11  . Magnesium 300 MG CAPS Take by mouth 2 (two) times daily.      . Multiple Vitamin (MULTIVITAMIN) tablet Take 1 tablet by mouth daily.      . nitroGLYCERIN (NITROSTAT) 0.4 MG SL tablet  Place 0.4 mg under the tongue every 5 (five) minutes as needed.      . Omega-3 Fatty Acids (FISH OIL) 1000 MG CAPS Take by mouth daily.      Marland Kitchen spironolactone (ALDACTONE) 25 MG tablet TAKE 1/2 TABLET ONCE A DAY 30 tablet 0   No current facility-administered medications for this visit.   Allergies:  Review of patient's allergies indicates no known allergies.   Social History: The patient  reports that she has never smoked. She has never used smokeless tobacco. She reports that she does not drink alcohol or use illicit drugs.   ROS:  Please see the history of present illness. Otherwise, complete review of systems is positive for arthritis pains.  All other systems are reviewed  and negative.   Physical Exam: VS:  BP 138/100 mmHg  Pulse 77  Ht 5\' 3"  (1.6 m)  Wt 151 lb (68.493 kg)  BMI 26.76 kg/m2  SpO2 99%, BMI Body mass index is 26.76 kg/(m^2).  Wt Readings from Last 3 Encounters:  12/30/15 151 lb (68.493 kg)  07/02/15 154 lb 1.9 oz (69.908 kg)  11/25/14 152 lb (68.947 kg)    Patient appears comfortable at rest.  HEENT: Conjunctiva and lids normal, oropharynx clear.  Neck: Supple, no elevated JVP or carotid bruits, no thyromegaly.  Lungs: Clear to auscultation, nonlabored breathing at rest.  Cardiac: Regular rate and rhythm, no S3, no pericardial rub.  Abdomen: Soft, nontender, bowel sounds present.  Extremities: No pitting edema, distal pulses 2+. Ecchymosis on right biceps area. Skin: Warm and dry.   Musculoskeletal: No kyphosis.  Neuropsychiatric: Alert and oriented x3, affect grossly appropriate.  ECG:  I personally reviewed the prior tracing from 11/25/2014 which showed normal sinus rhythm with left bundle branch block.  Recent Labwork: No results found for requested labs within last 365 days.     Component Value Date/Time   CHOL 176 11/26/2013 0223   TRIG 99 11/26/2013 0223   HDL 42 11/26/2013 0223   CHOLHDL 4.2 11/26/2013 0223   VLDL 20 11/26/2013 0223   LDLCALC 114* 11/26/2013 0223    Other Studies Reviewed Today:  Echocardiogram March 2015: Mild LVH with LVEF 40-45%, wall motion abnormalities consistent with ischemic cardiomyopathy, grade 1 diastolic dysfunction, mildly sclerotic aortic valve, MAC with mild to moderate mitral regurgitation, mild tricuspid regurgitation, PASP 32 mm mercury.   Assessment and Plan:  1. CAD status post anterior STEMI in January 2015 due to stent thrombosis within the LAD. She is on long-term aspirin and Plavix, has done well since that time. At this point she is not having any active angina symptoms. I have recommended continued medical therapy, exercise, and observation.  2. Ischemic  cardiomyopathy, LVEF 40-45% as of 2015. No evidence of volume overload on examination. Follow-up echocardiogram is being obtained to reassess cardiac structure and function.  3. Essential hypertension, blood pressure is not as well controlled. We discussed salt restriction, continuing medical therapy and increasing her walking regimen. There is further room for up titration of Cozaar.  4. Hyperlipidemia, she continues on Lipitor. Requesting records from Dr. Manuella Ghazi for review of her last lipid panel.  Current medicines were reviewed with the patient today.   Orders Placed This Encounter  Procedures  . EKG 12-Lead  . ECHOCARDIOGRAM COMPLETE    Disposition: FU with me in 6 months.   Signed, Satira Sark, MD, [facility] 12/30/2015 2:44 PM    [facility] at [place], [place], [place]  [place] Phone: [phone]; Fax: [phone]

## 2015-12-30 NOTE — Patient Instructions (Signed)

## 2016-01-05 ENCOUNTER — Other Ambulatory Visit: Payer: Self-pay

## 2016-01-05 ENCOUNTER — Ambulatory Visit (INDEPENDENT_AMBULATORY_CARE_PROVIDER_SITE_OTHER): Payer: PPO

## 2016-01-05 DIAGNOSIS — I255 Ischemic cardiomyopathy: Secondary | ICD-10-CM | POA: Diagnosis not present

## 2016-01-06 ENCOUNTER — Telehealth: Payer: Self-pay | Admitting: *Deleted

## 2016-01-06 NOTE — Telephone Encounter (Signed)
-----   Message from Satira Sark, MD sent at 01/05/2016  1:10 PM EST ----- Reviewed report. Please let her know that LVEF has improved compared to the prior study, now on the range of 50-55%. Continue medical therapy.

## 2016-01-06 NOTE — Telephone Encounter (Signed)
Patient informed. 

## 2016-02-01 ENCOUNTER — Other Ambulatory Visit: Payer: Self-pay | Admitting: Cardiology

## 2016-02-23 ENCOUNTER — Other Ambulatory Visit: Payer: Self-pay | Admitting: Cardiology

## 2016-06-01 ENCOUNTER — Telehealth: Payer: Self-pay | Admitting: *Deleted

## 2016-06-01 NOTE — Telephone Encounter (Signed)
Patient informed and verbalized understanding of plan. 

## 2016-06-01 NOTE — Telephone Encounter (Signed)
Ms. Yoh has a history of multiple LAD stents and subsequent stent thrombosis with acute infarct. In this case, preference would be to continue dual antiplatelet therapy long-term, which is why we have had her on both aspirin and Plavix. Unless she is having substantial bleeding problems, I would try not and alter this regimen for now.

## 2016-06-01 NOTE — Telephone Encounter (Signed)
Patient said she was told by ED nurses to contact her cardiologist about having a lot of unexplained bruising. Patient advised that aspirin and plavix could be contributing to her bruising and her cardiologist would be contacted for advice. No c/o chest pain, dizziness or sob.

## 2016-06-30 ENCOUNTER — Ambulatory Visit (INDEPENDENT_AMBULATORY_CARE_PROVIDER_SITE_OTHER): Payer: PPO | Admitting: Cardiology

## 2016-06-30 ENCOUNTER — Encounter: Payer: Self-pay | Admitting: Cardiology

## 2016-06-30 VITALS — BP 152/95 | HR 76 | Ht 63.0 in | Wt 153.2 lb

## 2016-06-30 DIAGNOSIS — I255 Ischemic cardiomyopathy: Secondary | ICD-10-CM | POA: Diagnosis not present

## 2016-06-30 DIAGNOSIS — I10 Essential (primary) hypertension: Secondary | ICD-10-CM

## 2016-06-30 DIAGNOSIS — I2510 Atherosclerotic heart disease of native coronary artery without angina pectoris: Secondary | ICD-10-CM

## 2016-06-30 DIAGNOSIS — E782 Mixed hyperlipidemia: Secondary | ICD-10-CM

## 2016-06-30 MED ORDER — LOSARTAN POTASSIUM 100 MG PO TABS: 100.0000 mg | ORAL_TABLET | Freq: Every day | ORAL | 3 refills | Status: DC

## 2016-06-30 NOTE — Patient Instructions (Signed)
Your physician wants you to follow-up in: [place] DR. Domenic Polite You will receive a reminder letter in the mail two months in advance. If you don't receive a letter, please call our office to schedule the follow-up appointment.  Your physician has recommended you make the following change in your medication:   INCREASE LOSARTAN 100 MG DAILY  Thank you for choosing [facility]!!

## 2016-06-30 NOTE — Progress Notes (Signed)
Cardiology Office Note  Date: 06/30/2016   ID: [name], DOB 1929/06/29, MRN [id number]  PCP: [name], MD  Primary Cardiologist: Rozann Lesches, MD   Chief Complaint  Patient presents with  . Coronary Artery Disease    History of Present Illness: Debra Doyle is an 82 y.o. female last seen in February. She presents for a follow-up visit. She does not report any chest pain or increasing shortness of breath on medical therapy. Has not been as active, not exercising regularly.  I reviewed her medications which are outlined below. She tells me that she has been compliant with them including DAPT. We have continued dual antiplatelet therapy due to multiple DES in the LAD and history of stent thrombosis. She does report easy bruising, no other major bleeding problems however.  Blood pressure was significantly elevated today, I rechecked it finding it to be somewhat better but still not optimal. We discussed increasing her Cozaar dose.  Follow-up echocardiogram from February showed improvement in LVEF to the range of 50-55%.  Past Medical History:  Diagnosis Date  . Coronary atherosclerosis of native coronary artery    DES x 4 to LAD 1/12  . Essential hypertension, benign   . Fibrocystic breast disease   . Hx of colonic polyps   . Ischemic cardiomyopathy   . Myocardial infarction [facility])    NSTEMI 1/12  . ST elevation (STEMI) myocardial infarction involving left anterior descending coronary artery [facility])    January 2015 - LAD stent thrombosis off DAPT    Past Surgical History:  Procedure Laterality Date  . CATARACT EXTRACTION, BILATERAL     Bilateral  . LEFT HEART CATHETERIZATION WITH CORONARY ANGIOGRAM N/A 11/25/2013   Procedure: LEFT HEART CATHETERIZATION WITH CORONARY ANGIOGRAM;  Surgeon: Sinclair Grooms, MD;  Location: [facility] CATH LAB;  Service: Cardiovascular;  Laterality: N/A;  . PERCUTANEOUS CORONARY STENT INTERVENTION (PCI-S)  11/25/2013   Procedure: PERCUTANEOUS  CORONARY STENT INTERVENTION (PCI-S);  Surgeon: Sinclair Grooms, MD;  Location: [facility] CATH LAB;  Service: Cardiovascular;;    Current Outpatient Prescriptions  Medication Sig Dispense Refill  . Ascorbic Acid (VITAMIN C) 500 MG tablet Take 500 mg by mouth daily.      Marland Kitchen aspirin EC 81 MG tablet Take 1 tablet (81 mg total) by mouth daily.    Marland Kitchen atorvastatin (LIPITOR) 40 MG tablet TAKE 1 TABLET (40 MG TOTAL) BY MOUTH DAILY AT 6 PM. 90 tablet 2  . Bilberry 100 MG CAPS Take by mouth daily.      . carvedilol (COREG) 6.25 MG tablet TAKE 1 TABLET TWICE A DAY 180 tablet 3  . Cinnamon 500 MG capsule Take 500 mg by mouth daily.      . clopidogrel (PLAVIX) 75 MG tablet TAKE 1 TABLET (75 MG TOTAL) BY MOUTH DAILY. 90 tablet 3  . Glucosamine 500 MG CAPS Take by mouth 2 (two) times daily.      . Magnesium 300 MG CAPS Take by mouth 2 (two) times daily.      . Multiple Vitamin (MULTIVITAMIN) tablet Take 1 tablet by mouth daily.      . nitroGLYCERIN (NITROSTAT) 0.4 MG SL tablet Place 0.4 mg under the tongue every 5 (five) minutes as needed.      . Omega-3 Fatty Acids (FISH OIL) 1000 MG CAPS Take by mouth daily.      Marland Kitchen spironolactone (ALDACTONE) 25 MG tablet TAKE 1/2 TABLET ONCE A DAY 30 tablet 6  . losartan (COZAAR) 100  MG tablet Take 1 tablet (100 mg total) by mouth daily. 90 tablet 3   No current facility-administered medications for this visit.    Allergies:  Review of patient's allergies indicates no known allergies.   Social History: The patient  reports that she has never smoked. She has never used smokeless tobacco. She reports that she does not drink alcohol or use drugs.   ROS:  Please see the history of present illness. Otherwise, complete review of systems is positive for generalized fatigue.  All other systems are reviewed and negative.   Physical Exam: VS:  BP (!) 152/95 (BP Location: Right Arm, Cuff Size: Normal)   Pulse 76   Ht 5\' 3"  (1.6 m)   Wt 153 lb 3.2 oz (69.5 kg)   SpO2 99%   BMI  27.14 kg/m , BMI Body mass index is 27.14 kg/m.  Wt Readings from Last 3 Encounters:  06/30/16 153 lb 3.2 oz (69.5 kg)  12/30/15 151 lb (68.5 kg)  07/02/15 154 lb 1.9 oz (69.9 kg)    Patient appears comfortable at rest.  HEENT: Conjunctiva and lids normal, oropharynx clear.  Neck: Supple, no elevated JVP or carotid bruits, no thyromegaly.  Lungs: Clear to auscultation, nonlabored breathing at rest.  Cardiac: Regular rate and rhythm, no S3, no pericardial rub.  Abdomen: Soft, nontender, bowel sounds present.  Extremities: No pitting edema, distal pulses 2+. Ecchymosis on right biceps area. Skin: Warm and dry.   Musculoskeletal: No kyphosis.  Neuropsychiatric: Alert and oriented x3, affect grossly appropriate.  ECG: I personally reviewed the tracing from 12/30/2015 which showed sinus rhythm with left bundle branch block.  Recent Labwork:  October 2016: Cholesterol 128, triglycerides 70, HDL 57, LDL 57, BUN 18, creatinine 0.8, potassium 4.7, AST 23, ALT 15, TSH 2.09, hemoglobin 14.5, platelets 232  Other Studies Reviewed Today:  Echocardiogram 01/05/2016: Study Conclusions  - Left ventricle: The cavity size was normal. Wall thickness was   increased in a pattern of mild LVH. Systolic function was normal.   The estimated ejection fraction was in the range of 50% to 55%.   Doppler parameters are consistent with abnormal left ventricular   relaxation (grade 1 diastolic dysfunction). - Aortic valve: Valve area (VTI): 2.54 cm^2. Valve area (Vmax):   2.54 cm^2. Valve area (Vmean): 2.75 cm^2. - Technically difficult study.  Assessment and Plan:  1. Symptomatically stable CAD status post DES 4 to the LAD in 2012 with stent thrombosis in 2015. Plan is to continue current regimen including DAPT. Encouraged basic exercise regimen as before.  2. Essential hypertension, not well controlled based on today's evaluation. Increase Cozaar 100 mg daily.  3. Ischemic cardiomyopathy  with improvement in LVEF to the range of 50-55% by echocardiogram in February of this year.  4. Hyperlipidemia, on Lipitor. LDL was 57 as of October 2016.  Current medicines were reviewed with the patient today.  Disposition: Follow-up with me in 6 months.  Signed, Satira Sark, MD, [facility] 06/30/2016 3:09 PM    [facility] at [place], [place],  [address] Phone: [phone]; Fax: [phone]

## 2016-08-16 DIAGNOSIS — E119 Type 2 diabetes mellitus without complications: Secondary | ICD-10-CM | POA: Diagnosis not present

## 2016-08-16 DIAGNOSIS — Z961 Presence of intraocular lens: Secondary | ICD-10-CM | POA: Diagnosis not present

## 2016-09-20 DIAGNOSIS — Z6826 Body mass index (BMI) 26.0-26.9, adult: Secondary | ICD-10-CM | POA: Diagnosis not present

## 2016-09-20 DIAGNOSIS — Z1389 Encounter for screening for other disorder: Secondary | ICD-10-CM | POA: Diagnosis not present

## 2016-09-20 DIAGNOSIS — Z0000 Encounter for general adult medical examination without abnormal findings: Secondary | ICD-10-CM | POA: Diagnosis not present

## 2016-09-20 DIAGNOSIS — Z1211 Encounter for screening for malignant neoplasm of colon: Secondary | ICD-10-CM | POA: Diagnosis not present

## 2016-09-20 DIAGNOSIS — Z299 Encounter for prophylactic measures, unspecified: Secondary | ICD-10-CM | POA: Diagnosis not present

## 2016-09-20 DIAGNOSIS — E559 Vitamin D deficiency, unspecified: Secondary | ICD-10-CM | POA: Diagnosis not present

## 2016-09-20 DIAGNOSIS — E7800 Pure hypercholesterolemia, unspecified: Secondary | ICD-10-CM | POA: Diagnosis not present

## 2016-09-20 DIAGNOSIS — Z79899 Other long term (current) drug therapy: Secondary | ICD-10-CM | POA: Diagnosis not present

## 2016-09-20 DIAGNOSIS — R5383 Other fatigue: Secondary | ICD-10-CM | POA: Diagnosis not present

## 2016-09-20 DIAGNOSIS — Z7189 Other specified counseling: Secondary | ICD-10-CM | POA: Diagnosis not present

## 2016-09-24 ENCOUNTER — Other Ambulatory Visit: Payer: Self-pay | Admitting: Cardiology

## 2016-12-23 ENCOUNTER — Other Ambulatory Visit: Payer: Self-pay | Admitting: Cardiology

## 2016-12-29 ENCOUNTER — Other Ambulatory Visit: Payer: Self-pay | Admitting: Cardiology

## 2017-02-23 ENCOUNTER — Ambulatory Visit (INDEPENDENT_AMBULATORY_CARE_PROVIDER_SITE_OTHER): Payer: PPO | Admitting: Cardiology

## 2017-02-23 ENCOUNTER — Encounter: Payer: Self-pay | Admitting: Cardiology

## 2017-02-23 VITALS — BP 158/80 | HR 64 | Ht 63.0 in | Wt 150.0 lb

## 2017-02-23 DIAGNOSIS — I10 Essential (primary) hypertension: Secondary | ICD-10-CM

## 2017-02-23 DIAGNOSIS — I2510 Atherosclerotic heart disease of native coronary artery without angina pectoris: Secondary | ICD-10-CM | POA: Diagnosis not present

## 2017-02-23 DIAGNOSIS — E782 Mixed hyperlipidemia: Secondary | ICD-10-CM | POA: Diagnosis not present

## 2017-02-23 DIAGNOSIS — I255 Ischemic cardiomyopathy: Secondary | ICD-10-CM | POA: Diagnosis not present

## 2017-02-23 NOTE — Patient Instructions (Signed)

## 2017-02-23 NOTE — Progress Notes (Signed)
Cardiology Office Note  Date: 02/23/2017   ID: [name], DOB 12-17-1928, MRN [id number]  PCP: [name], MD  Primary Cardiologist: Rozann Lesches, MD   Chief Complaint  Patient presents with  . Coronary Artery Disease    History of Present Illness: Debra Doyle is an 83 y.o. female last seen in August 2017. She presents for a routine follow-up visit. Reports no angina symptoms, she has been going to the [facility] 5 days a week to exercise for 30 minutes at a time.  Current cardiac medications are outlined below. She is on aspirin, Plavix, Coreg, Cozaar, Lipitor and Aldactone. She states she has been compliant most of the time, but admits that she does skip things from time to time. I reinforced regular medication use particular with a prior history of acute stent thrombosis..  Personally reviewed her ECG today which shows sinus rhythm with IVCD and nonspecific T-wave changes.  She continues to follow with Dr. Manuella Ghazi for primary care.  Past Medical History:  Diagnosis Date  . Coronary atherosclerosis of native coronary artery    DES x 4 to LAD 1/12  . Essential hypertension, benign   . Fibrocystic breast disease   . Hx of colonic polyps   . Ischemic cardiomyopathy   . Myocardial infarction    NSTEMI 1/12  . ST elevation (STEMI) myocardial infarction involving left anterior descending coronary artery [facility])    January 2015 - LAD stent thrombosis off DAPT    Past Surgical History:  Procedure Laterality Date  . CATARACT EXTRACTION, BILATERAL     Bilateral  . LEFT HEART CATHETERIZATION WITH CORONARY ANGIOGRAM N/A 11/25/2013   Procedure: LEFT HEART CATHETERIZATION WITH CORONARY ANGIOGRAM;  Surgeon: Sinclair Grooms, MD;  Location: [facility] CATH LAB;  Service: Cardiovascular;  Laterality: N/A;  . PERCUTANEOUS CORONARY STENT INTERVENTION (PCI-S)  11/25/2013   Procedure: PERCUTANEOUS CORONARY STENT INTERVENTION (PCI-S);  Surgeon: Sinclair Grooms, MD;  Location: [facility] CATH LAB;  Service:  Cardiovascular;;    Current Outpatient Prescriptions  Medication Sig Dispense Refill  . Ascorbic Acid (VITAMIN C) 500 MG tablet Take 500 mg by mouth daily.      Marland Kitchen aspirin EC 81 MG tablet Take 1 tablet (81 mg total) by mouth daily.    Marland Kitchen atorvastatin (LIPITOR) 40 MG tablet TAKE 1 TABLET (40 MG TOTAL) BY MOUTH DAILY AT 6 PM. 90 tablet 2  . Bilberry 100 MG CAPS Take by mouth daily.      . carvedilol (COREG) 6.25 MG tablet TAKE 1 TABLET TWICE A DAY 180 tablet 3  . Cinnamon 500 MG capsule Take 500 mg by mouth daily.      . clopidogrel (PLAVIX) 75 MG tablet TAKE 1 TABLET (75 MG TOTAL) BY MOUTH DAILY. 90 tablet 3  . Glucosamine 500 MG CAPS Take by mouth 2 (two) times daily.      Marland Kitchen losartan (COZAAR) 50 MG tablet TAKE 1 TABLET BY MOUTH DAILY 30 tablet 3  . Magnesium 300 MG CAPS Take by mouth 2 (two) times daily.      . Multiple Vitamin (MULTIVITAMIN) tablet Take 1 tablet by mouth daily.      . nitroGLYCERIN (NITROSTAT) 0.4 MG SL tablet Place 0.4 mg under the tongue every 5 (five) minutes as needed.      . Omega-3 Fatty Acids (FISH OIL) 1000 MG CAPS Take by mouth daily.      Marland Kitchen spironolactone (ALDACTONE) 25 MG tablet TAKE 1/2 TABLET ONCE A DAY 30 tablet  6  . losartan (COZAAR) 100 MG tablet Take 1 tablet (100 mg total) by mouth daily. 90 tablet 3   No current facility-administered medications for this visit.    Allergies:  Patient has no known allergies.   Social History: The patient  reports that she has never smoked. She has never used smokeless tobacco. She reports that she does not drink alcohol or use drugs.   ROS:  Please see the history of present illness. Otherwise, complete review of systems is positive for none.  All other systems are reviewed and negative.   Physical Exam: VS:  BP (!) 158/80   Pulse 64   Ht 5\' 3"  (1.6 m)   Wt 150 lb (68 kg)   SpO2 98%   BMI 26.57 kg/m , BMI Body mass index is 26.57 kg/m.  Wt Readings from Last 3 Encounters:  02/23/17 150 lb (68 kg)  06/30/16  153 lb 3.2 oz (69.5 kg)  12/30/15 151 lb (68.5 kg)    Patient appears comfortable at rest.  HEENT: Conjunctiva and lids normal, oropharynx clear.  Neck: Supple, no elevated JVP or carotid bruits, no thyromegaly.  Lungs: Clear to auscultation, nonlabored breathing at rest.  Cardiac: Regular rate and rhythm, no S3, no pericardial rub.  Abdomen: Soft, nontender, bowel sounds present.  Extremities: No pitting edema, distal pulses 2+. Ecchymosis on right biceps area. Skin: Warm and dry.  Musculoskeletal: No kyphosis.  Neuropsychiatric: Alert and oriented x3, affect grossly appropriate.  ECG: I personally reviewed the tracing from 12/30/2015 which showed sinus rhythm with left bundle branch block.  Recent Labwork:  October 2016: Cholesterol 128, triglycerides 70, HDL 57, LDL 57, BUN 18, creatinine 0.8, potassium 4.7, AST 23, ALT 15, TSH 2.09, hemoglobin 14.5, platelets 232  Other Studies Reviewed Today:  Echocardiogram 01/05/2016: Study Conclusions  - Left ventricle: The cavity size was normal. Wall thickness was increased in a pattern of mild LVH. Systolic function was normal. The estimated ejection fraction was in the range of 50% to 55%. Doppler parameters are consistent with abnormal left ventricular relaxation (grade 1 diastolic dysfunction). - Aortic valve: Valve area (VTI): 2.54 cm^2. Valve area (Vmax): 2.54 cm^2. Valve area (Vmean): 2.75 cm^2. - Technically difficult study.  Assessment and Plan:  1. CAD status post DES 4 to the LAD in 2012 with acute stent thrombosis in 2015 requiring recurrent intervention. Plan is to continue long-term DAPT. She is clinically stable without angina on present regimen and exercising 5 days a week. I reinforced medication compliance with her again today.   2. Essential hypertension, continue with current medical therapy and follow with Dr. Manuella Ghazi.  3. History of hyperlipidemia, states that she is on Lipitor as before. LDL has  been well controlled based on previous workup, keep follow-up with Dr. Manuella Ghazi.  4. History of ischemic cardiomyopathy, last LVEF 50-55% as of 2017. No evidence of volume overload.  Current medicines were reviewed with the patient today.   Orders Placed This Encounter  Procedures  . EKG 12-Lead    Disposition: Follow-up in 6 months.  Signed, Satira Sark, MD, [facility] 02/23/2017 3:09 PM    [facility] at [place], [place], [place] [address] Phone: [phone]; Fax: [phone]

## 2017-03-16 DIAGNOSIS — I10 Essential (primary) hypertension: Secondary | ICD-10-CM | POA: Diagnosis not present

## 2017-03-16 DIAGNOSIS — Z6826 Body mass index (BMI) 26.0-26.9, adult: Secondary | ICD-10-CM | POA: Diagnosis not present

## 2017-03-16 DIAGNOSIS — N390 Urinary tract infection, site not specified: Secondary | ICD-10-CM | POA: Diagnosis not present

## 2017-03-16 DIAGNOSIS — R350 Frequency of micturition: Secondary | ICD-10-CM | POA: Diagnosis not present

## 2017-03-16 DIAGNOSIS — I2510 Atherosclerotic heart disease of native coronary artery without angina pectoris: Secondary | ICD-10-CM | POA: Diagnosis not present

## 2017-03-16 DIAGNOSIS — Z789 Other specified health status: Secondary | ICD-10-CM | POA: Diagnosis not present

## 2017-03-16 DIAGNOSIS — Z299 Encounter for prophylactic measures, unspecified: Secondary | ICD-10-CM | POA: Diagnosis not present

## 2017-03-16 DIAGNOSIS — E7800 Pure hypercholesterolemia, unspecified: Secondary | ICD-10-CM | POA: Diagnosis not present

## 2017-03-19 DIAGNOSIS — E7800 Pure hypercholesterolemia, unspecified: Secondary | ICD-10-CM | POA: Diagnosis not present

## 2017-04-28 ENCOUNTER — Other Ambulatory Visit: Payer: Self-pay | Admitting: Cardiology

## 2017-05-25 DIAGNOSIS — E7800 Pure hypercholesterolemia, unspecified: Secondary | ICD-10-CM | POA: Diagnosis not present

## 2017-05-25 DIAGNOSIS — Z299 Encounter for prophylactic measures, unspecified: Secondary | ICD-10-CM | POA: Diagnosis not present

## 2017-05-25 DIAGNOSIS — R350 Frequency of micturition: Secondary | ICD-10-CM | POA: Diagnosis not present

## 2017-05-25 DIAGNOSIS — I2510 Atherosclerotic heart disease of native coronary artery without angina pectoris: Secondary | ICD-10-CM | POA: Diagnosis not present

## 2017-05-25 DIAGNOSIS — N390 Urinary tract infection, site not specified: Secondary | ICD-10-CM | POA: Diagnosis not present

## 2017-05-25 DIAGNOSIS — Z6826 Body mass index (BMI) 26.0-26.9, adult: Secondary | ICD-10-CM | POA: Diagnosis not present

## 2017-07-28 ENCOUNTER — Other Ambulatory Visit: Payer: Self-pay | Admitting: Cardiology

## 2017-08-28 ENCOUNTER — Other Ambulatory Visit: Payer: Self-pay | Admitting: Cardiology

## 2017-08-31 DIAGNOSIS — I10 Essential (primary) hypertension: Secondary | ICD-10-CM | POA: Diagnosis not present

## 2017-08-31 DIAGNOSIS — Z299 Encounter for prophylactic measures, unspecified: Secondary | ICD-10-CM | POA: Diagnosis not present

## 2017-08-31 DIAGNOSIS — E7800 Pure hypercholesterolemia, unspecified: Secondary | ICD-10-CM | POA: Diagnosis not present

## 2017-08-31 DIAGNOSIS — I2510 Atherosclerotic heart disease of native coronary artery without angina pectoris: Secondary | ICD-10-CM | POA: Diagnosis not present

## 2017-08-31 DIAGNOSIS — Z6827 Body mass index (BMI) 27.0-27.9, adult: Secondary | ICD-10-CM | POA: Diagnosis not present

## 2017-09-10 DIAGNOSIS — Z6827 Body mass index (BMI) 27.0-27.9, adult: Secondary | ICD-10-CM | POA: Diagnosis not present

## 2017-09-10 DIAGNOSIS — R350 Frequency of micturition: Secondary | ICD-10-CM | POA: Diagnosis not present

## 2017-09-10 DIAGNOSIS — N390 Urinary tract infection, site not specified: Secondary | ICD-10-CM | POA: Diagnosis not present

## 2017-09-10 DIAGNOSIS — Z299 Encounter for prophylactic measures, unspecified: Secondary | ICD-10-CM | POA: Diagnosis not present

## 2017-09-10 DIAGNOSIS — I2510 Atherosclerotic heart disease of native coronary artery without angina pectoris: Secondary | ICD-10-CM | POA: Diagnosis not present

## 2017-09-22 ENCOUNTER — Other Ambulatory Visit: Payer: Self-pay | Admitting: Cardiology

## 2017-10-23 ENCOUNTER — Ambulatory Visit: Payer: PPO | Admitting: Cardiology

## 2017-10-29 ENCOUNTER — Other Ambulatory Visit: Payer: Self-pay

## 2017-10-29 MED ORDER — SPIRONOLACTONE 25 MG PO TABS: ORAL_TABLET | ORAL | 0 refills | Status: DC

## 2017-10-30 ENCOUNTER — Other Ambulatory Visit: Payer: Self-pay | Admitting: *Deleted

## 2017-10-30 MED ORDER — SPIRONOLACTONE 25 MG PO TABS: 12.5000 mg | ORAL_TABLET | Freq: Every day | ORAL | 3 refills | Status: DC

## 2017-11-26 ENCOUNTER — Other Ambulatory Visit: Payer: Self-pay

## 2017-11-26 ENCOUNTER — Emergency Department (HOSPITAL_COMMUNITY)
Admission: EM | Admit: 2017-11-26 | Discharge: 2017-11-26 | Disposition: A | Discharge status: Home / Self Care | Payer: PPO | Attending: Emergency Medicine | Admitting: Emergency Medicine

## 2017-11-26 ENCOUNTER — Encounter (HOSPITAL_COMMUNITY): Payer: Self-pay | Admitting: Emergency Medicine

## 2017-11-26 ENCOUNTER — Emergency Department (HOSPITAL_COMMUNITY): Payer: PPO

## 2017-11-26 DIAGNOSIS — Y999 Unspecified external cause status: Secondary | ICD-10-CM | POA: Insufficient documentation

## 2017-11-26 DIAGNOSIS — Y939 Activity, unspecified: Secondary | ICD-10-CM | POA: Diagnosis not present

## 2017-11-26 DIAGNOSIS — S92321A Displaced fracture of second metatarsal bone, right foot, initial encounter for closed fracture: Secondary | ICD-10-CM | POA: Diagnosis not present

## 2017-11-26 DIAGNOSIS — Z7982 Long term (current) use of aspirin: Secondary | ICD-10-CM | POA: Insufficient documentation

## 2017-11-26 DIAGNOSIS — S92301A Fracture of unspecified metatarsal bone(s), right foot, initial encounter for closed fracture: Secondary | ICD-10-CM | POA: Insufficient documentation

## 2017-11-26 DIAGNOSIS — W19XXXA Unspecified fall, initial encounter: Secondary | ICD-10-CM | POA: Diagnosis not present

## 2017-11-26 DIAGNOSIS — S6991XA Unspecified injury of right wrist, hand and finger(s), initial encounter: Secondary | ICD-10-CM | POA: Diagnosis present

## 2017-11-26 DIAGNOSIS — I2510 Atherosclerotic heart disease of native coronary artery without angina pectoris: Secondary | ICD-10-CM | POA: Insufficient documentation

## 2017-11-26 DIAGNOSIS — I10 Essential (primary) hypertension: Secondary | ICD-10-CM | POA: Diagnosis not present

## 2017-11-26 DIAGNOSIS — Z79899 Other long term (current) drug therapy: Secondary | ICD-10-CM | POA: Diagnosis not present

## 2017-11-26 DIAGNOSIS — Z955 Presence of coronary angioplasty implant and graft: Secondary | ICD-10-CM | POA: Insufficient documentation

## 2017-11-26 DIAGNOSIS — S62656A Nondisplaced fracture of medial phalanx of right little finger, initial encounter for closed fracture: Secondary | ICD-10-CM | POA: Insufficient documentation

## 2017-11-26 DIAGNOSIS — Y929 Unspecified place or not applicable: Secondary | ICD-10-CM | POA: Diagnosis not present

## 2017-11-26 DIAGNOSIS — S92331A Displaced fracture of third metatarsal bone, right foot, initial encounter for closed fracture: Secondary | ICD-10-CM | POA: Diagnosis not present

## 2017-11-26 DIAGNOSIS — S92341A Displaced fracture of fourth metatarsal bone, right foot, initial encounter for closed fracture: Secondary | ICD-10-CM | POA: Diagnosis not present

## 2017-11-26 DIAGNOSIS — S62616A Displaced fracture of proximal phalanx of right little finger, initial encounter for closed fracture: Secondary | ICD-10-CM | POA: Diagnosis not present

## 2017-11-26 MED ORDER — HYDROCODONE-ACETAMINOPHEN 5-325 MG PO TABS: 1.0000 | ORAL_TABLET | Freq: Four times a day (QID) | ORAL | 0 refills | Status: DC | PRN

## 2017-11-26 NOTE — ED Triage Notes (Signed)
Pt fell 1/11. Pt c/o right foot and right hand. Bruising noted to right hand. Pedal and radial pulses present. A/o. Denies hiting head

## 2017-11-26 NOTE — Discharge Instructions (Addendum)
Your x-rays reveal a fracture of the little finger on your right hand.  Please leave your splint in place until seen by Dr. Berenice Primas or a member of his team.  Your x-rays also reveal 3 fractures of 3 bones in your right foot.  Please use the Cam walker when up and about.  Please use caution getting around.  Please use Tylenol extra strength for pain.  May use Norco for more severe pain.  Norco may cause drowsiness, and/or lightheadedness.  Please use this medication with caution.

## 2017-11-26 NOTE — ED Provider Notes (Signed)
[facility] EMERGENCY DEPARTMENT Provider Note   CSN: [id number] Arrival date & time: 11/26/17  1451     History   Chief Complaint Chief Complaint  Patient presents with  . Fall    HPI Debra Doyle is a 84 y.o. female.  Patient is an 84 year old female who presents to the emergency department following a fall.  Patient states that she fell on January 11 and injured the right hand and the right foot.  She attempted conservative measures, but continued to have swelling.  She has problems using the right fifth finger.  She has difficulty standing or walking on the right lower extremity.  Her family brought her to the emergency department for additional evaluation.  The patient denies any bleeding disorders.  She denies being on any anticoagulation medications.  There is been no previous operations or procedures.  The patient denies hitting her head, hurting her neck, or head hurting her chest.  No other injuries reported.      Past Medical History:  Diagnosis Date  . Coronary atherosclerosis of native coronary artery    DES x 4 to LAD 1/12  . Essential hypertension, benign   . Fibrocystic breast disease   . Hx of colonic polyps   . Ischemic cardiomyopathy   . Myocardial infarction [facility])    NSTEMI 1/12  . ST elevation (STEMI) myocardial infarction involving left anterior descending coronary artery [facility])    January 2015 - LAD stent thrombosis off DAPT    Patient Active Problem List   Diagnosis Date Noted  . Ischemic cardiomyopathy 01/09/2014  . Mixed hyperlipidemia 12/29/2010  . Essential hypertension, benign 12/29/2010  . Coronary atherosclerosis of native coronary artery 12/29/2010    Past Surgical History:  Procedure Laterality Date  . CATARACT EXTRACTION, BILATERAL     Bilateral  . LEFT HEART CATHETERIZATION WITH CORONARY ANGIOGRAM N/A 11/25/2013   Procedure: LEFT HEART CATHETERIZATION WITH CORONARY ANGIOGRAM;  Surgeon: Sinclair Grooms, MD;  Location: [facility] CATH  LAB;  Service: Cardiovascular;  Laterality: N/A;  . PERCUTANEOUS CORONARY STENT INTERVENTION (PCI-S)  11/25/2013   Procedure: PERCUTANEOUS CORONARY STENT INTERVENTION (PCI-S);  Surgeon: Sinclair Grooms, MD;  Location: [facility] CATH LAB;  Service: Cardiovascular;;    OB History    No data available       Home Medications    Prior to Admission medications   Medication Sig Start Date End Date Taking? Authorizing Provider  Ascorbic Acid (VITAMIN C) 500 MG tablet Take 500 mg by mouth daily.      [provider]  aspirin EC 81 MG tablet Take 1 tablet (81 mg total) by mouth daily. 11/25/14   Satira Sark, MD  atorvastatin (LIPITOR) 40 MG tablet TAKE 1 TABLET (40 MG TOTAL) BY MOUTH DAILY AT 6 PM. 02/15/15   Satira Sark, MD  Bilberry 100 MG CAPS Take by mouth daily.      [provider]  carvedilol (COREG) 6.25 MG tablet TAKE 1 TABLET TWICE A DAY 12/18/14   Satira Sark, MD  Cinnamon 500 MG capsule Take 500 mg by mouth daily.      [provider]  clopidogrel (PLAVIX) 75 MG tablet TAKE 1 TABLET (75 MG TOTAL) BY MOUTH DAILY. 12/29/16   Satira Sark, MD  Glucosamine 500 MG CAPS Take by mouth 2 (two) times daily.      [provider]  losartan (COZAAR) 100 MG tablet Take 1 tablet (100 mg total) by mouth daily. 06/30/16  09/28/16  Satira Sark, MD  losartan (COZAAR) 50 MG tablet Take 1 tablet (50 mg total) by mouth daily. 08/28/17   Satira Sark, MD  Magnesium 300 MG CAPS Take by mouth 2 (two) times daily.      [provider]  Multiple Vitamin (MULTIVITAMIN) tablet Take 1 tablet by mouth daily.      [provider]  nitroGLYCERIN (NITROSTAT) 0.4 MG SL tablet Place 0.4 mg under the tongue every 5 (five) minutes as needed.      [provider]  Omega-3 Fatty Acids (FISH OIL) 1000 MG CAPS Take by mouth daily.      [provider]  spironolactone (ALDACTONE) 25 MG tablet TAKE 1/2 TABLET ONCE A DAY 10/29/17    Herminio Commons, MD  spironolactone (ALDACTONE) 25 MG tablet Take 0.5 tablets (12.5 mg total) by mouth daily. 10/30/17   Satira Sark, MD    Family History Family History  Problem Relation Age of Onset  . Coronary artery disease Mother   . Emphysema Father   . Cancer Sister        1 sister/cancer type unknown  . Stroke Brother        1 brother    Social History Social History   Tobacco Use  . Smoking status: Never Smoker  . Smokeless tobacco: Never Used  Substance Use Topics  . Alcohol use: No  . Drug use: No     Allergies   Patient has no known allergies.   Review of Systems Review of Systems  Constitutional: Negative for activity change.       All ROS Neg except as noted in HPI  HENT: Negative for nosebleeds.   Eyes: Negative for photophobia and discharge.  Respiratory: Negative for cough, shortness of breath and wheezing.   Cardiovascular: Negative for chest pain and palpitations.  Gastrointestinal: Negative for abdominal pain and blood in stool.  Genitourinary: Negative for dysuria, frequency and hematuria.  Musculoskeletal: Positive for arthralgias. Negative for back pain and neck pain.  Skin: Negative.   Neurological: Negative for dizziness, seizures and speech difficulty.  Psychiatric/Behavioral: Negative for confusion and hallucinations.     Physical Exam Updated Vital Signs BP (!) 186/105 (BP Location: Left Arm)   Pulse 72   Temp 98.2 F (36.8 C) (Oral)   Resp 18   SpO2 99%   Physical Exam  HENT:  No bruise or hematoma noted.  Abdominal:  No rib or abdominal wall pain.  Musculoskeletal:  There is full range of motion of the right shoulder, elbow, and wrist.  There is deformity of the right fifth finger.  There is bruising of the right middle finger ring finger and fifth finger.  Capillary refill is less than 2 seconds.  Radial pulses 2+.  There is good range of motion of the right hip and knee.  There is no deformity of the tibial  area.  There is swelling of the dorsum of the right foot.  There is tenderness just beyond the MP joint of the second third and fourth toe on the right.  Capillary refill is less than 2 seconds.  The dorsalis pedis pulses 2+.     ED Treatments / Results  Labs (all labs ordered are listed, but only abnormal results are displayed) Labs Reviewed - No data to display  EKG  EKG Interpretation None       Radiology Dg Hand Complete Right  Result Date: 11/26/2017 CLINICAL DATA:  Status post fall 2  days ago with persistent fifth finger pain and swelling. EXAM: RIGHT HAND - COMPLETE 3+ VIEW COMPARISON:  None in PACs FINDINGS: The bones are osteopenic. There is an angulated mildly displaced fracture involving the base of the shaft of the proximal phalanx of the fifth finger. The fracture line does not appear to extend to the articular surface. The interphalangeal joints are well maintained. The first through fourth digits are intact. There is degenerative narrowing of the second and third MCP joints. There are degenerative changes of the first and second CMC joints. IMPRESSION: There is an acute angulated and mildly displaced fracture through the base of the shaft of the proximal phalanx of the right fifth finger. Electronically Signed   By: David  Martinique M.D.   On: 11/26/2017 16:48   Dg Foot Complete Right  Result Date: 11/26/2017 CLINICAL DATA:  Right foot pain and swelling after fall 2 days ago. EXAM: RIGHT FOOT COMPLETE - 3+ VIEW COMPARISON:  None. FINDINGS: Moderately angulated fractures are seen involving distal portions of the second, third and fourth metatarsals. Joint spaces are intact. No soft tissue abnormality is noted. IMPRESSION: Moderately angulated distal second, third and fourth metatarsal fractures. Electronically Signed   By: Marijo Conception, M.D.   On: 11/26/2017 16:46    Procedures Procedures (including critical care time) FRACTURE CARE FINGER. Patient sustained a fall on  January 11.  She fell on her right hand.  She sustained a fracture of the right fifth finger.  I discussed the fracture with the patient in terms which she understands.  I discussed immobilization in terms of which she understands.  Patient identified by armband.  Patient fitted with a finger splint to the fifth finger of the right hand.  After the application of the finger splint by nursing staff, the patient has good capillary refill.  There are no neurovascular deficits appreciated.  No temperature changes.  Patient states the pain has improved after the splint was applied.  Patient tolerated the procedure without problem.  FRACTURE CARE RIGHT FOOT. Patient sustained a fall on January 11.  She injured her right foot during the fall.  She had some pain at that time but continued to notice swelling and came to the emergency department with family.  X-ray reveals a fracture of the distal second third and fourth metatarsal.  I discussed the fracture with the patient in terms of which she understands.  I discussed the fracture care in terms which she understands and the patient gives permission to proceed with the procedure.  Patient was identified by armband.  The patient was fitted with a Cam walker.  After the procedure, there are no gross neurologic deficits.  Patient tolerated the procedure without problem.  Pain medication offered, but patient declines at this time. Medications Ordered in ED Medications - No data to display   Initial Impression / Assessment and Plan / ED Course  I have reviewed the triage vital signs and the nursing notes.  Pertinent labs & imaging results that were available during my care of the patient were reviewed by me and considered in my medical decision making (see chart for details).      Patient seen with me by Dr. Tanna Furry. Final Clinical Impressions(s) / ED Diagnoses MDM Blood pressure is elevated initially on admission to the emergency department at  186/105, will have the patient have this rechecked.  Patient sustained a fall on January 11.  X-rays reveal fracture of the right fifth finger.  Patient received  a finger splint for this fracture.  Patient has a fracture of the second third and fourth metatarsal areas.  Patient was placed in a Cam walker for this. Patient will follow up with Dr. Berenice Primas for orthopedic evaluation.  Patient will return to the emergency department if any changes, problems, or concerns.   Final diagnoses:  Nondisplaced fracture of middle phalanx of right little finger, initial encounter for closed fracture  Multiple closed fractures of metatarsal bone of right foot, initial encounter    ED Discharge Orders        Ordered    HYDROcodone-acetaminophen (NORCO/VICODIN) 5-325 MG tablet  Every 6 hours PRN     11/26/17 1809       Lily Kocher, PA-C 11/26/17 1810    Tanna Furry, MD 12/01/17 1645

## 2017-11-26 NOTE — ED Provider Notes (Signed)
Patient seen and evaluated.  Discussed with Lily Kocher PA-C.  Patient has phalanx fracture of the hand.  Has distal metatarsal fractures of 2, 3, 4 in the foot.  Agree with cam walker.  Family assistance at home.  Hand splint.  Orthopedic follow-up.  Ice, elevation, Tylenol.   Tanna Furry, MD 11/26/17 1806

## 2017-11-27 ENCOUNTER — Other Ambulatory Visit: Payer: Self-pay

## 2017-11-30 DIAGNOSIS — S62646A Nondisplaced fracture of proximal phalanx of right little finger, initial encounter for closed fracture: Secondary | ICD-10-CM | POA: Diagnosis not present

## 2017-11-30 DIAGNOSIS — S92301A Fracture of unspecified metatarsal bone(s), right foot, initial encounter for closed fracture: Secondary | ICD-10-CM | POA: Diagnosis not present

## 2017-12-01 ENCOUNTER — Other Ambulatory Visit: Payer: Self-pay | Admitting: Cardiology

## 2017-12-05 ENCOUNTER — Ambulatory Visit (INDEPENDENT_AMBULATORY_CARE_PROVIDER_SITE_OTHER): Payer: PPO

## 2017-12-05 ENCOUNTER — Encounter: Payer: Self-pay | Admitting: Orthopaedic Surgery

## 2017-12-05 ENCOUNTER — Ambulatory Visit: Payer: PPO | Admitting: Orthopaedic Surgery

## 2017-12-05 VITALS — BP 167/107 | HR 61 | Temp 98.8°F | Ht 63.0 in

## 2017-12-05 DIAGNOSIS — S62616A Displaced fracture of proximal phalanx of right little finger, initial encounter for closed fracture: Secondary | ICD-10-CM | POA: Diagnosis not present

## 2017-12-05 DIAGNOSIS — S92321A Displaced fracture of second metatarsal bone, right foot, initial encounter for closed fracture: Secondary | ICD-10-CM | POA: Diagnosis not present

## 2017-12-05 DIAGNOSIS — S92331A Displaced fracture of third metatarsal bone, right foot, initial encounter for closed fracture: Secondary | ICD-10-CM | POA: Diagnosis not present

## 2017-12-05 DIAGNOSIS — S92341A Displaced fracture of fourth metatarsal bone, right foot, initial encounter for closed fracture: Secondary | ICD-10-CM

## 2017-12-05 MED ORDER — HYDROCODONE-ACETAMINOPHEN 5-325 MG PO TABS: ORAL_TABLET | ORAL | 0 refills | Status: DC

## 2017-12-05 NOTE — Progress Notes (Signed)
Cardiology Office Note  Date: 12/07/2017   ID: Debra Doyle, DOB 11-20-1928, MRN [id number]  PCP: [name], MD  Primary Cardiologist: Rozann Lesches, MD   Chief Complaint  Patient presents with  . Coronary Artery Disease    History of Present Illness: Debra Doyle is an 84 y.o. female last seen in April 2018. She is here today with her daughter-in-law. She had a recent fall without syncope, broke some bones in her right foot and right hand. She is recuperating but limited in terms of ambulation. She is wearing a boot on her right foot. In a wheelchair today. She is not able to do her regular exercise.  From a cardiac perspective she has had no progressive angina symptoms or nitroglycerin use. I reviewed her cardiac medications which are stable and outlined below. She reports compliance. No bleeding problems on dual antiplatelet therapy.  She has not had a recent physical with PCP, I encouraged her to make a follow-up visit.  Past Medical History:  Diagnosis Date  . Coronary atherosclerosis of native coronary artery    DES x 4 to LAD 1/12  . Essential hypertension, benign   . Fibrocystic breast disease   . Hx of colonic polyps   . Ischemic cardiomyopathy   . Myocardial infarction [facility])    NSTEMI 1/12  . ST elevation (STEMI) myocardial infarction involving left anterior descending coronary artery [facility])    January 2015 - LAD stent thrombosis off DAPT    Past Surgical History:  Procedure Laterality Date  . CATARACT EXTRACTION, BILATERAL     Bilateral  . LEFT HEART CATHETERIZATION WITH CORONARY ANGIOGRAM N/A 11/25/2013   Procedure: LEFT HEART CATHETERIZATION WITH CORONARY ANGIOGRAM;  Surgeon: Sinclair Grooms, MD;  Location[facility] C[facility] LAB;  Service: Cardiovascular;  Laterality: N/A;  . PERCUTANEOUS CORONARY STENT INTERVENTION (PCI-S)  11/25/2013   Procedure: PERCUTANEOUS CORONARY STENT INTERVENTION (PCI-S);  Surgeon: Sinclair Grooms, MD;  Location: [facility] CATH LAB;  Service:  Cardiovascular;;    Current Outpatient Medications  Medication Sig Dispense Refill  . Ascorbic Acid (VITAMIN C) 500 MG tablet Take 500 mg by mouth daily.      Marland Kitchen aspirin EC 81 MG tablet Take 1 tablet (81 mg total) by mouth daily.    Marland Kitchen atorvastatin (LIPITOR) 40 MG tablet TAKE 1 TABLET (40 MG TOTAL) BY MOUTH DAILY AT 6 PM. 90 tablet 2  . Bilberry 100 MG CAPS Take by mouth daily.      . carvedilol (COREG) 6.25 MG tablet TAKE 1 TABLET TWICE A DAY 180 tablet 3  . Cinnamon 500 MG capsule Take 500 mg by mouth daily.      . clopidogrel (PLAVIX) 75 MG tablet TAKE 1 TABLET (75 MG TOTAL) BY MOUTH DAILY. 90 tablet 3  . Glucosamine 500 MG CAPS Take by mouth 2 (two) times daily.      Marland Kitchen HYDROcodone-acetaminophen (NORCO/VICODIN) 5-325 MG tablet One tablet every four hours as needed for acute pain.  Limit of five days per [place] statue. 30 tablet 0  . losartan (COZAAR) 50 MG tablet Take 1 tablet (50 mg total) by mouth daily. 30 tablet 2  . Magnesium 300 MG CAPS Take by mouth 2 (two) times daily.      . Multiple Vitamin (MULTIVITAMIN) tablet Take 1 tablet by mouth daily.      . nitroGLYCERIN (NITROSTAT) 0.4 MG SL tablet Place 0.4 mg under the tongue every 5 (five) minutes as needed.      Marland Kitchen  Omega-3 Fatty Acids (FISH OIL) 1000 MG CAPS Take by mouth daily.      Marland Kitchen spironolactone (ALDACTONE) 25 MG tablet Take 0.5 tablets (12.5 mg total) by mouth daily. 45 tablet 3   No current facility-administered medications for this visit.    Allergies:  Patient has no known allergies.   Social History: The patient  reports that  has never smoked. she has never used smokeless tobacco. She reports that she does not drink alcohol or use drugs.   ROS:  Please see the history of present illness. Otherwise, complete review of systems is positive for improving right foot and hand pain.  All other systems are reviewed and negative.   Physical Exam: VS:  BP (!) 148/88   Pulse 76   Ht 5\' 2"  (1.575 m)   SpO2 98%   BMI 27.44  kg/m , BMI Body mass index is 27.44 kg/m.  Wt Readings from Last 3 Encounters:  02/23/17 150 lb (68 kg)  06/30/16 153 lb 3.2 oz (69.5 kg)  12/30/15 151 lb (68.5 kg)    General: Elderly woman in a wheelchair. HEENT: Conjunctiva and lids normal, oropharynx clear. Neck: Supple, no elevated JVP or carotid bruits, no thyromegaly. Lungs: Clear to auscultation, nonlabored breathing at rest. Cardiac: Regular rate and rhythm, no S3 or significant systolic murmur. Abdomen: Soft, nontender, bowel sounds present. Extremities: Support boot on right foot, splint on right hand. No pitting edema, distal pulses 2+. Skin: Warm and dry. Musculoskeletal: No kyphosis. Neuropsychiatric: Alert and oriented x3, affect grossly appropriate.  ECG: I personally reviewed the tracing from 02/23/2017 which showed normal sinus rhythm with IVCD.  Recent Labwork:  October 2016: Cholesterol 128, triglycerides 70, HDL 57, LDL 57, BUN 18, creatinine 0.8, potassium 4.7, AST 23, ALT 15, TSH 2.09, hemoglobin 14.5, platelets 232  Other Studies Reviewed Today:  Echocardiogram 01/05/2016: Study Conclusions  - Left ventricle: The cavity size was normal. Wall thickness was   increased in a pattern of mild LVH. Systolic function was normal.   The estimated ejection fraction was in the range of 50% to 55%.   Doppler parameters are consistent with abnormal left ventricular   relaxation (grade 1 diastolic dysfunction). - Aortic valve: Valve area (VTI): 2.54 cm^2. Valve area (Vmax):   2.54 cm^2. Valve area (Vmean): 2.75 cm^2. - Technically difficult study.  Assessment and Plan:  1. CAD with history of DES 4 to the LAD in 2012 with acute stent thrombosis in 2015 requiring repeat intervention. She has been clinically stable since that time and remains on long-term dual antiplatelet therapy. The absence of progressive angina we will continue with observation for now. Follow-up stress testing could be considered later when she  has recovered from her recent injuries.  2. Essential hypertension, continue with current medications and make follow-up with PCP.  3. Mixed hyperlipidemia, on Lipitor. Arrange follow-up with PCP for lipid panel.  4. Ischemic cardiomyopathy with improvement in LVEF, last assessed at 50-55%.  Current medicines were reviewed with the patient today.  Disposition: Follow-up in 6 months.  Signed, Satira Sark, MD, [facility] 12/07/2017 4:00 PM    [facility] [place], [place], [place] [address] Phone: [phone]; Fax: [phone]

## 2017-12-05 NOTE — Progress Notes (Signed)
DG FINGER

## 2017-12-05 NOTE — Progress Notes (Signed)
Subjective:    Patient ID: Debra Doyle, female    DOB: 23-Nov-1928, 84 y.o.   MRN: [id number]  HPI She fell at home on 11-23-17 and hurt her right foot and right hand.  She was seen in the ER on 11-26-17.  X-rays of the right foot showed: IMPRESSION: Moderately angulated distal second, third and fourth metatarsal Fractures.  X-rays of the right hand showed a displaced proximal phalanx fracture of the little finger.  She was placed in a finger splint with no reduction of the finger and a CAM walker for the foot.  She has no other injuries.  Both the foot and the hand hurt, the foot more.  I have reviewed the ER records, X-rays and xray reports.    Review of Systems  HENT: Negative for congestion.   Musculoskeletal: Positive for arthralgias, gait problem and joint swelling.  All other systems reviewed and are negative.  Past Medical History:  Diagnosis Date  . Coronary atherosclerosis of native coronary artery    DES x 4 to LAD 1/12  . Essential hypertension, benign   . Fibrocystic breast disease   . Hx of colonic polyps   . Ischemic cardiomyopathy   . Myocardial infarction [facility])    NSTEMI 1/12  . ST elevation (STEMI) myocardial infarction involving left anterior descending coronary artery [facility])    January 2015 - LAD stent thrombosis off DAPT    Past Surgical History:  Procedure Laterality Date  . CATARACT EXTRACTION, BILATERAL     Bilateral  . LEFT HEART CATHETERIZATION WITH CORONARY ANGIOGRAM N/A 11/25/2013   Procedure: LEFT HEART CATHETERIZATION WITH CORONARY ANGIOGRAM;  Surgeon: Sinclair Grooms, MD;  Location: [facility] CATH LAB;  Service: Cardiovascular;  Laterality: N/A;  . PERCUTANEOUS CORONARY STENT INTERVENTION (PCI-S)  11/25/2013   Procedure: PERCUTANEOUS CORONARY STENT INTERVENTION (PCI-S);  Surgeon: Sinclair Grooms, MD;  Location: [facility] CATH LAB;  Service: Cardiovascular;;    Current Outpatient Medications on File Prior to Visit  Medication Sig Dispense Refill  .  Ascorbic Acid (VITAMIN C) 500 MG tablet Take 500 mg by mouth daily.      Marland Kitchen aspirin EC 81 MG tablet Take 1 tablet (81 mg total) by mouth daily.    Marland Kitchen atorvastatin (LIPITOR) 40 MG tablet TAKE 1 TABLET (40 MG TOTAL) BY MOUTH DAILY AT 6 PM. 90 tablet 2  . Bilberry 100 MG CAPS Take by mouth daily.      . carvedilol (COREG) 6.25 MG tablet TAKE 1 TABLET TWICE A DAY 180 tablet 3  . Cinnamon 500 MG capsule Take 500 mg by mouth daily.      . clopidogrel (PLAVIX) 75 MG tablet TAKE 1 TABLET (75 MG TOTAL) BY MOUTH DAILY. 90 tablet 3  . Glucosamine 500 MG CAPS Take by mouth 2 (two) times daily.      Marland Kitchen HYDROcodone-acetaminophen (NORCO/VICODIN) 5-325 MG tablet Take 1 tablet by mouth every 6 (six) hours as needed. 12 tablet 0  . losartan (COZAAR) 100 MG tablet Take 1 tablet (100 mg total) by mouth daily. 90 tablet 3  . losartan (COZAAR) 50 MG tablet Take 1 tablet (50 mg total) by mouth daily. 30 tablet 2  . losartan (COZAAR) 50 MG tablet TAKE 1 TABLET BY MOUTH DAILY *CALL MD FOR DUE APPOINTMENT 30 tablet 6  . Magnesium 300 MG CAPS Take by mouth 2 (two) times daily.      . Multiple Vitamin (MULTIVITAMIN) tablet Take 1 tablet by mouth daily.      Marland Kitchen  nitroGLYCERIN (NITROSTAT) 0.4 MG SL tablet Place 0.4 mg under the tongue every 5 (five) minutes as needed.      . Omega-3 Fatty Acids (FISH OIL) 1000 MG CAPS Take by mouth daily.      Marland Kitchen spironolactone (ALDACTONE) 25 MG tablet TAKE 1/2 TABLET ONCE A DAY 30 tablet 0  . spironolactone (ALDACTONE) 25 MG tablet Take 0.5 tablets (12.5 mg total) by mouth daily. 45 tablet 3   No current facility-administered medications on file prior to visit.     Social History   Socioeconomic History  . Marital status: Widowed    Spouse name: Not on file  . Number of children: Not on file  . Years of education: Not on file  . Highest education level: Not on file  Social Needs  . Financial resource strain: Not on file  . Food insecurity - worry: Not on file  . Food insecurity -  inability: Not on file  . Transportation needs - medical: Not on file  . Transportation needs - non-medical: Not on file  Occupational History  . Occupation: Retired    Fish farm manager: RETIRED  Tobacco Use  . Smoking status: Never Smoker  . Smokeless tobacco: Never Used  Substance and Sexual Activity  . Alcohol use: No  . Drug use: No  . Sexual activity: Not on file  Other Topics Concern  . Not on file  Social History Narrative  . Not on file    Family History  Problem Relation Age of Onset  . Coronary artery disease Mother   . Emphysema Father   . Cancer Sister        1 sister/cancer type unknown  . Stroke Brother        1 brother    BP (!) 167/107   Pulse 61   Temp 98.8 F (37.1 C)   Ht 5\' 3"  (1.6 m)   BMI 26.57 kg/m       Objective:   Physical Exam  Constitutional: She is oriented to person, place, and time. She appears well-developed and well-nourished.  HENT:  Head: Normocephalic and atraumatic.  Eyes: Conjunctivae and EOM are normal. Pupils are equal, round, and reactive to light.  Neck: Normal range of motion. Neck supple.  Cardiovascular: Normal rate, regular rhythm and intact distal pulses.  Pulmonary/Chest: Effort normal.  Abdominal: Soft.  Musculoskeletal: She exhibits tenderness (Right foot with swelliing and swelling of the toes of the right foot, NV intact, No redness.  ROM ankle tender.  Left foot negative.) and deformity (Right little finger with deformity of the little finger and marked ulnar diviation of the finger, NV intact.).  Neurological: She is alert and oriented to person, place, and time. She displays normal reflexes. No cranial nerve deficit. She exhibits normal muscle tone. Coordination normal.  Skin: Skin is warm and dry.  Psychiatric: She has a normal mood and affect. Her behavior is normal. Judgment and thought content normal.  Vitals reviewed.   X-rays were done of the little finger right, reported separately.      Assessment &  Plan:   Encounter Diagnoses  Name Primary?  . Closed displaced fracture of proximal phalanx of right little finger, initial encounter Yes  . Closed displaced fracture of second metatarsal bone of right foot, initial encounter   . Closed displaced fracture of third metatarsal bone of right foot, initial encounter   . Closed displaced fracture of fourth metatarsal bone of right foot, initial encounter    The finger needs closed  reduction.  Procedure note: After permission from the patient, the right little finger had a digital block with 1% Xylocaine by sterile technique tolerated well.  A closed reduction was then done and an aluminum splint applied.  Post reduction x-rays were done.  I have reviewed the [facility] web site prior to prescribing narcotic medicine for this patient.   As for the foot, a sheet of Contrast Bath instructions were done.  The CAM walker was adjusted.  Return in one week.  X-rays of the right little finger in splint. X-rays of the right foot on return.  Elevate, ice.   Call if any problem.  Precautions discussed.  Electronically Signed Sanjuana Kava, MD 1/23/20192:36 PM

## 2017-12-07 ENCOUNTER — Encounter: Payer: Self-pay | Admitting: Cardiology

## 2017-12-07 ENCOUNTER — Ambulatory Visit: Payer: PPO | Admitting: Cardiology

## 2017-12-07 VITALS — BP 148/88 | HR 76 | Ht 62.0 in

## 2017-12-07 DIAGNOSIS — I2510 Atherosclerotic heart disease of native coronary artery without angina pectoris: Secondary | ICD-10-CM | POA: Diagnosis not present

## 2017-12-07 DIAGNOSIS — I10 Essential (primary) hypertension: Secondary | ICD-10-CM | POA: Diagnosis not present

## 2017-12-07 DIAGNOSIS — E782 Mixed hyperlipidemia: Secondary | ICD-10-CM

## 2017-12-07 DIAGNOSIS — I255 Ischemic cardiomyopathy: Secondary | ICD-10-CM

## 2017-12-07 NOTE — Patient Instructions (Signed)

## 2017-12-12 ENCOUNTER — Ambulatory Visit (INDEPENDENT_AMBULATORY_CARE_PROVIDER_SITE_OTHER): Payer: PPO

## 2017-12-12 ENCOUNTER — Ambulatory Visit (INDEPENDENT_AMBULATORY_CARE_PROVIDER_SITE_OTHER): Payer: PPO | Admitting: Orthopaedic Surgery

## 2017-12-12 ENCOUNTER — Encounter: Payer: Self-pay | Admitting: Orthopaedic Surgery

## 2017-12-12 DIAGNOSIS — S92321D Displaced fracture of second metatarsal bone, right foot, subsequent encounter for fracture with routine healing: Secondary | ICD-10-CM | POA: Diagnosis not present

## 2017-12-12 DIAGNOSIS — S62616D Displaced fracture of proximal phalanx of right little finger, subsequent encounter for fracture with routine healing: Secondary | ICD-10-CM

## 2017-12-12 DIAGNOSIS — S92341D Displaced fracture of fourth metatarsal bone, right foot, subsequent encounter for fracture with routine healing: Secondary | ICD-10-CM

## 2017-12-12 DIAGNOSIS — S92331D Displaced fracture of third metatarsal bone, right foot, subsequent encounter for fracture with routine healing: Secondary | ICD-10-CM

## 2017-12-12 NOTE — Progress Notes (Signed)
CC:  My foot is OK My hand is not hurting.  She has been using the CAM walker and has little pain.  The right little finger has not hurt.  She is keeping the split in place.  NV is intact.  She has a rotary change of the little finger. A new splint applied.  She has changes of the fracture of the finger displacing again.  I adjusted the finger and applied a new splint.  She might need surgery of the finger.  Encounter Diagnoses  Name Primary?  . Closed displaced fracture of third metatarsal bone of right foot with routine healing, subsequent encounter Yes  . Closed displaced fracture of fourth metatarsal bone of right foot with routine healing, subsequent encounter   . Closed displaced fracture of second metatarsal bone of right foot with routine healing, subsequent encounter   . Closed displaced fracture of proximal phalanx of right little finger with routine healing, subsequent encounter    Return in one week.  Xray of the right little finger in splint.  Call if any problem.  Precautions discussed.   Electronically Signed Sanjuana Kava, MD 1/30/20193:07 PM

## 2017-12-19 ENCOUNTER — Ambulatory Visit (INDEPENDENT_AMBULATORY_CARE_PROVIDER_SITE_OTHER): Payer: PPO | Admitting: Orthopaedic Surgery

## 2017-12-19 ENCOUNTER — Ambulatory Visit (INDEPENDENT_AMBULATORY_CARE_PROVIDER_SITE_OTHER): Payer: PPO

## 2017-12-19 ENCOUNTER — Encounter: Payer: Self-pay | Admitting: Orthopaedic Surgery

## 2017-12-19 DIAGNOSIS — S62616D Displaced fracture of proximal phalanx of right little finger, subsequent encounter for fracture with routine healing: Secondary | ICD-10-CM

## 2017-12-19 MED ORDER — HYDROCODONE-ACETAMINOPHEN 5-325 MG PO TABS: ORAL_TABLET | ORAL | 0 refills | Status: DC

## 2017-12-19 NOTE — Addendum Note (Signed)
Addended by: Willette Pa on: 12/19/2017 03:20 PM   Modules accepted: Orders

## 2017-12-19 NOTE — Progress Notes (Addendum)
CC:  My boot is falling apart.  I need a new one.  Her CAM walker is coming apart.  I will get a new and smaller one for the foot fractures on the right.  The left little finger is not hurting.  She has been using the splint.  NV is intact.  Rotation looks good.  X-rays of the little finger were done, reported separately.  Encounter Diagnosis  Name Primary?  . Closed displaced fracture of proximal phalanx of right little finger with routine healing, subsequent encounter Yes   A new CAM walker is given.  A new aluminum splint is given.  Return in two weeks.  X-rays of the right foot and left little finger.  I have reviewed the [facility] web site prior to prescribing narcotic medicine for this patient.   Call if any problem.  Precautions discussed.   Electronically Signed Sanjuana Kava, MD 2/6/20192:46 PM

## 2018-01-02 ENCOUNTER — Ambulatory Visit (INDEPENDENT_AMBULATORY_CARE_PROVIDER_SITE_OTHER): Payer: PPO | Admitting: Orthopaedic Surgery

## 2018-01-02 ENCOUNTER — Ambulatory Visit (INDEPENDENT_AMBULATORY_CARE_PROVIDER_SITE_OTHER): Payer: PPO

## 2018-01-02 ENCOUNTER — Encounter: Payer: Self-pay | Admitting: Orthopaedic Surgery

## 2018-01-02 VITALS — BP 167/92 | Temp 99.5°F | Ht 62.0 in

## 2018-01-02 DIAGNOSIS — S62616D Displaced fracture of proximal phalanx of right little finger, subsequent encounter for fracture with routine healing: Secondary | ICD-10-CM

## 2018-01-02 DIAGNOSIS — S92331D Displaced fracture of third metatarsal bone, right foot, subsequent encounter for fracture with routine healing: Secondary | ICD-10-CM

## 2018-01-02 MED ORDER — HYDROCODONE-ACETAMINOPHEN 5-325 MG PO TABS: ORAL_TABLET | ORAL | 0 refills | Status: DC

## 2018-01-02 NOTE — Addendum Note (Signed)
Addended by: Willette Pa on: 01/02/2018 02:23 PM   Modules accepted: Orders

## 2018-01-02 NOTE — Progress Notes (Signed)
CC:  My foot is much better  She has fractures of multiple metatarsals of the right foot.  She has much less pain and no swelling.  X-rays of the foot and hand were done, reported separately.  NV is intact.  I told her she can come out of the CAM walker and try a shoe.  If it hurts, go back into the CAM walker.  She has fracture of the base of the little finger proximal phalanx, not into the joint.  There is some lateral displacement of the finger but no rotation it appears today.  NV is intact.  A new aluminum splint applied.  Encounter Diagnoses  Name Primary?  . Closed displaced fracture of third metatarsal bone of right foot with routine healing, subsequent encounter Yes  . Closed displaced fracture of proximal phalanx of right little finger with routine healing, subsequent encounter    Return in three weeks.  X-rays of both the hand and foot.  Remove the splint for the hand x-rays next time.  Call if any problem.  Precautions discussed.   Electronically Signed Sanjuana Kava, MD 2/20/20192:04 PM

## 2018-01-09 DIAGNOSIS — E7800 Pure hypercholesterolemia, unspecified: Secondary | ICD-10-CM | POA: Diagnosis not present

## 2018-01-09 DIAGNOSIS — Z6827 Body mass index (BMI) 27.0-27.9, adult: Secondary | ICD-10-CM | POA: Diagnosis not present

## 2018-01-09 DIAGNOSIS — I10 Essential (primary) hypertension: Secondary | ICD-10-CM | POA: Diagnosis not present

## 2018-01-09 DIAGNOSIS — Z789 Other specified health status: Secondary | ICD-10-CM | POA: Diagnosis not present

## 2018-01-09 DIAGNOSIS — Z299 Encounter for prophylactic measures, unspecified: Secondary | ICD-10-CM | POA: Diagnosis not present

## 2018-01-21 DIAGNOSIS — S92331A Displaced fracture of third metatarsal bone, right foot, initial encounter for closed fracture: Secondary | ICD-10-CM | POA: Insufficient documentation

## 2018-01-21 DIAGNOSIS — S62646A Nondisplaced fracture of proximal phalanx of right little finger, initial encounter for closed fracture: Secondary | ICD-10-CM | POA: Insufficient documentation

## 2018-01-23 ENCOUNTER — Ambulatory Visit (INDEPENDENT_AMBULATORY_CARE_PROVIDER_SITE_OTHER): Payer: PPO | Admitting: Orthopedic Surgery

## 2018-01-23 ENCOUNTER — Ambulatory Visit (INDEPENDENT_AMBULATORY_CARE_PROVIDER_SITE_OTHER): Payer: PPO

## 2018-01-23 ENCOUNTER — Encounter: Payer: Self-pay | Admitting: Orthopedic Surgery

## 2018-01-23 VITALS — BP 156/106 | HR 70 | Ht 62.0 in

## 2018-01-23 DIAGNOSIS — S92334D Nondisplaced fracture of third metatarsal bone, right foot, subsequent encounter for fracture with routine healing: Secondary | ICD-10-CM

## 2018-01-23 DIAGNOSIS — S62646D Nondisplaced fracture of proximal phalanx of right little finger, subsequent encounter for fracture with routine healing: Secondary | ICD-10-CM

## 2018-01-23 NOTE — Progress Notes (Signed)
Fracture care follow-up  Chief Complaint  Patient presents with  . Foot Pain    follow up  right foot fracture 11/26/17  . Hand Pain    right 5th finger fracture     Encounter Diagnoses  Name Primary?  . Closed nondisplaced fracture of third metatarsal bone of right foot with routine healing, subsequent encounter 11/26/17   . Closed nondisplaced fracture of proximal phalanx of right little finger with routine healing, subsequent encounter 11/26/17 Yes     Right foot feels great patient able to wear regular shoe and weight-bear as tolerated  Right small finger does show stiffness of the metacarpophalangeal joint related to the proximal phalanx fracture   BP (!) 156/106   Pulse 70   Ht 5\' 2"  (1.575 m)   BMI 27.44 kg/m   Physical Exam  Full range of motion no tenderness in the right foot  Slight angulation stiffness right metacarpal phalangeal joint   Xrays: See separate reports related to the x-rays but the proximal phalanx fracture comminuted slightly angulated but healed  Right foot fracture healed    Plan   Resume normal activities follow-up as needed patient has been advised that the metacarpophalangeal joint will be stiff but she does not have any restrictions

## 2018-02-11 DIAGNOSIS — I10 Essential (primary) hypertension: Secondary | ICD-10-CM | POA: Diagnosis not present

## 2018-02-11 DIAGNOSIS — Z1331 Encounter for screening for depression: Secondary | ICD-10-CM | POA: Diagnosis not present

## 2018-02-11 DIAGNOSIS — Z1339 Encounter for screening examination for other mental health and behavioral disorders: Secondary | ICD-10-CM | POA: Diagnosis not present

## 2018-02-11 DIAGNOSIS — Z79899 Other long term (current) drug therapy: Secondary | ICD-10-CM | POA: Diagnosis not present

## 2018-02-11 DIAGNOSIS — E2839 Other primary ovarian failure: Secondary | ICD-10-CM | POA: Diagnosis not present

## 2018-02-11 DIAGNOSIS — Z1211 Encounter for screening for malignant neoplasm of colon: Secondary | ICD-10-CM | POA: Diagnosis not present

## 2018-02-11 DIAGNOSIS — Z6827 Body mass index (BMI) 27.0-27.9, adult: Secondary | ICD-10-CM | POA: Diagnosis not present

## 2018-02-11 DIAGNOSIS — R5383 Other fatigue: Secondary | ICD-10-CM | POA: Diagnosis not present

## 2018-02-11 DIAGNOSIS — E559 Vitamin D deficiency, unspecified: Secondary | ICD-10-CM | POA: Diagnosis not present

## 2018-02-11 DIAGNOSIS — Z7189 Other specified counseling: Secondary | ICD-10-CM | POA: Diagnosis not present

## 2018-02-11 DIAGNOSIS — Z299 Encounter for prophylactic measures, unspecified: Secondary | ICD-10-CM | POA: Diagnosis not present

## 2018-02-11 DIAGNOSIS — Z0000 Encounter for general adult medical examination without abnormal findings: Secondary | ICD-10-CM | POA: Diagnosis not present

## 2018-02-11 DIAGNOSIS — I2510 Atherosclerotic heart disease of native coronary artery without angina pectoris: Secondary | ICD-10-CM | POA: Diagnosis not present

## 2018-03-06 DIAGNOSIS — I10 Essential (primary) hypertension: Secondary | ICD-10-CM | POA: Diagnosis not present

## 2018-03-06 DIAGNOSIS — Z299 Encounter for prophylactic measures, unspecified: Secondary | ICD-10-CM | POA: Diagnosis not present

## 2018-03-06 DIAGNOSIS — Z713 Dietary counseling and surveillance: Secondary | ICD-10-CM | POA: Diagnosis not present

## 2018-03-06 DIAGNOSIS — Z6827 Body mass index (BMI) 27.0-27.9, adult: Secondary | ICD-10-CM | POA: Diagnosis not present

## 2018-03-06 DIAGNOSIS — I2510 Atherosclerotic heart disease of native coronary artery without angina pectoris: Secondary | ICD-10-CM | POA: Diagnosis not present

## 2018-03-14 ENCOUNTER — Other Ambulatory Visit: Payer: Self-pay

## 2018-03-14 NOTE — Patient Outreach (Signed)
[facility]) Care Management  03/14/2018  ZISSY HAMLETT 1929-03-06 [id number]   Nurse Call Line Referral Date: 03/14/18 Reason for Referral: back injury/ pain Nurse call line recommendation:  See doctor within 24 hour  Telephone call to patient regarding nurse call line referral. HIPAA verified with patient. Explained reason for call. Patient states, "I just stood up and something happened to my back.  I'm not really sure what it was."  Patient states she did not get to call her doctor today.  Patient states she has used cold packs on her back. Denies taking any medication for patient. Patient states her back is doing better. On a scale of 0 to 10 with 10 being worse back pain is 0.  Patient states she will schedule an appointment with her chiropractor who she has seen for the past 9 years next week.  Patient denies any further needs or concerns. RNCM advised patient to call doctor if back pain persist.  Verbalized  Understanding.   PLAN: RNCM will close patient due to patient being assessed and having no further needs.   Quinn Plowman RN,BSN,CCM [facility] Telephonic  [phone]

## 2018-04-25 DIAGNOSIS — Z299 Encounter for prophylactic measures, unspecified: Secondary | ICD-10-CM | POA: Diagnosis not present

## 2018-04-25 DIAGNOSIS — I10 Essential (primary) hypertension: Secondary | ICD-10-CM | POA: Diagnosis not present

## 2018-04-25 DIAGNOSIS — I2510 Atherosclerotic heart disease of native coronary artery without angina pectoris: Secondary | ICD-10-CM | POA: Diagnosis not present

## 2018-04-25 DIAGNOSIS — Z713 Dietary counseling and surveillance: Secondary | ICD-10-CM | POA: Diagnosis not present

## 2018-04-25 DIAGNOSIS — N390 Urinary tract infection, site not specified: Secondary | ICD-10-CM | POA: Diagnosis not present

## 2018-06-06 NOTE — Progress Notes (Signed)
Cardiology Office Note  Date: 06/10/2018   ID: STEPHANI JANAK, DOB 08-Jul-1929, MRN [id number]  PCP: [name], MD  Primary Cardiologist: Rozann Lesches, MD   Chief Complaint  Patient presents with  . Coronary Artery Disease    History of Present Illness: Debra Doyle is an 84 y.o. female last seen in January.  She is here today for a follow-up visit.  She does not report any angina symptoms or nitroglycerin use.  States that she has been sedentary although did get a small exercise machine recently through the mail to work out her legs.  She has been doing some arm exercises.  She states that she uses a cane intermittently, no falls in the last 6 months.  Family looks in on her regularly.  I reviewed her medications.  Cardiac regimen includes aspirin, Lipitor, Coreg, Cozaar, Aldactone, and as needed nitroglycerin.  Pressure was elevated today.  She states that it is usually not elevated when she checks it at her PCP.  I asked her to keep a blood pressure log for at least a week prior to her pending visit with PCP in early August.  She may need a higher dose of Cozaar.  She reports having lab work done for a physical within the last 6 months.  Past Medical History:  Diagnosis Date  . Coronary atherosclerosis of native coronary artery    DES x 4 to LAD 1/12  . Essential hypertension, benign   . Fibrocystic breast disease   . Hx of colonic polyps   . Ischemic cardiomyopathy   . Myocardial infarction [facility])    NSTEMI 1/12  . ST elevation (STEMI) myocardial infarction involving left anterior descending coronary artery [facility])    January 2015 - LAD stent thrombosis off DAPT    Past Surgical History:  Procedure Laterality Date  . CATARACT EXTRACTION, BILATERAL     Bilateral  . LEFT HEART CATHETERIZATION WITH CORONARY ANGIOGRAM N/A 11/25/2013   Procedure: LEFT HEART CATHETERIZATION WITH CORONARY ANGIOGRAM;  Surgeon: Sinclair Grooms, MD;  Location: [facility] CATH LAB;  Service:  Cardiovascular;  Laterality: N/A;  . PERCUTANEOUS CORONARY STENT INTERVENTION (PCI-S)  11/25/2013   Procedure: PERCUTANEOUS CORONARY STENT INTERVENTION (PCI-S);  Surgeon: Sinclair Grooms, MD;  Location: [facility] CATH LAB;  Service: Cardiovascular;;    Current Outpatient Medications  Medication Sig Dispense Refill  . Ascorbic Acid (VITAMIN C) 500 MG tablet Take 500 mg by mouth daily.      Marland Kitchen aspirin EC 81 MG tablet Take 1 tablet (81 mg total) by mouth daily.    Marland Kitchen atorvastatin (LIPITOR) 40 MG tablet TAKE 1 TABLET (40 MG TOTAL) BY MOUTH DAILY AT 6 PM. 90 tablet 2  . Bilberry 100 MG CAPS Take by mouth daily.      . carvedilol (COREG) 6.25 MG tablet TAKE 1 TABLET TWICE A DAY 180 tablet 3  . Cinnamon 500 MG capsule Take 500 mg by mouth daily.      . Glucosamine 500 MG CAPS Take by mouth 2 (two) times daily.      Marland Kitchen losartan (COZAAR) 50 MG tablet TAKE 1 TABLET BY MOUTH DAILY *CALL MD FOR DUE APPOINTMENT 90 tablet 0  . Magnesium 250 MG TABS Take 1 tablet by mouth daily.    . Multiple Vitamin (MULTIVITAMIN) tablet Take 1 tablet by mouth daily.      . nitroGLYCERIN (NITROSTAT) 0.4 MG SL tablet Place 1 tablet (0.4 mg total) under the tongue every 5 (five)  minutes x 3 doses as needed (if no relief after 3rd dose, proceed to the ED for an evaluation). 25 tablet 3  . Omega-3 Fatty Acids (FISH OIL) 1000 MG CAPS Take by mouth daily.      Marland Kitchen spironolactone (ALDACTONE) 25 MG tablet Take 0.5 tablets (12.5 mg total) by mouth daily. 45 tablet 3   No current facility-administered medications for this visit.    Allergies:  Patient has no known allergies.   Social History: The patient  reports that she has never smoked. She has never used smokeless tobacco. She reports that she does not drink alcohol or use drugs.   ROS:  Please see the history of present illness. Otherwise, complete review of systems is positive for hearing loss.  All other systems are reviewed and negative.   Physical Exam: VS:  BP (!) 183/90 (BP  Location: Right Arm, Cuff Size: Normal)   Pulse 78   Ht 5\' 2"  (1.575 m)   Wt 151 lb 12.8 oz (68.9 kg)   BMI 27.76 kg/m , BMI Body mass index is 27.76 kg/m.  Wt Readings from Last 3 Encounters:  06/10/18 151 lb 12.8 oz (68.9 kg)  02/23/17 150 lb (68 kg)  06/30/16 153 lb 3.2 oz (69.5 kg)    General: Elderly woman, appears comfortable at rest. HEENT: Conjunctiva and lids normal, oropharynx clear. Neck: Supple, no elevated JVP or carotid bruits, no thyromegaly. Lungs: Clear to auscultation, nonlabored breathing at rest. Cardiac: Regular rate and rhythm, no S3, soft systolic murmur. Abdomen: Soft, nontender, bowel sounds present. Extremities: No pitting edema, distal pulses 2+. Skin: Warm and dry. Musculoskeletal: No kyphosis. Neuropsychiatric: Alert and oriented x3, affect grossly appropriate.  ECG: I personally reviewed the tracing from 02/23/2017 which showed normal sinus rhythm with IVCD.  Recent Labwork:  October 2016: Cholesterol 128, triglycerides 70, HDL 57, LDL 57, BUN 18, creatinine 0.8, potassium 4.7, AST 23, ALT 15, TSH 2.09, hemoglobin 14.5, platelets 232  Other Studies Reviewed Today:  Echocardiogram 01/05/2016: Study Conclusions  - Left ventricle: The cavity size was normal. Wall thickness was increased in a pattern of mild LVH. Systolic function was normal. The estimated ejection fraction was in the range of 50% to 55%. Doppler parameters are consistent with abnormal left ventricular relaxation (grade 1 diastolic dysfunction). - Aortic valve: Valve area (VTI): 2.54 cm^2. Valve area (Vmax): 2.54 cm^2. Valve area (Vmean): 2.75 cm^2. - Technically difficult study.  Assessment and Plan:  1.  CAD with history of DES x4 to the LAD in 2012 and subsequent stent thrombosis in 2015 requiring repeat intervention.  She is to remain on dual antiplatelet therapy, no reported bleeding problems and no active angina at this time.  2.  Essential hypertension, blood  pressure elevated today.  I asked her to make a blood pressure log for at least a week prior to her pending visit with PCP in early August.  She may need up titration of Cozaar.  3.  Mixed hyperlipidemia on Lipitor.  Requesting interval lab work from Dr. Manuella Ghazi.  4.  Ischemic cardiomyopathy with LVEF 50 to 55% by last assessment.  Current medicines were reviewed with the patient today.   Orders Placed This Encounter  Procedures  . EKG 12-Lead    Disposition: Follow-up in 6 months.  Signed, Satira Sark, MD, [facility] 06/10/2018 4:34 PM    [facility] [place], [place], [place] [address] Phone: [phone]; Fax: [phone]

## 2018-06-09 ENCOUNTER — Other Ambulatory Visit: Payer: Self-pay | Admitting: Cardiology

## 2018-06-10 ENCOUNTER — Ambulatory Visit (INDEPENDENT_AMBULATORY_CARE_PROVIDER_SITE_OTHER): Payer: PPO | Admitting: Cardiology

## 2018-06-10 ENCOUNTER — Encounter: Payer: Self-pay | Admitting: Cardiology

## 2018-06-10 VITALS — BP 183/90 | HR 78 | Ht 62.0 in | Wt 151.8 lb

## 2018-06-10 DIAGNOSIS — I2510 Atherosclerotic heart disease of native coronary artery without angina pectoris: Secondary | ICD-10-CM

## 2018-06-10 DIAGNOSIS — I255 Ischemic cardiomyopathy: Secondary | ICD-10-CM | POA: Diagnosis not present

## 2018-06-10 DIAGNOSIS — I10 Essential (primary) hypertension: Secondary | ICD-10-CM

## 2018-06-10 DIAGNOSIS — E782 Mixed hyperlipidemia: Secondary | ICD-10-CM | POA: Diagnosis not present

## 2018-06-10 MED ORDER — NITROGLYCERIN 0.4 MG SL SUBL: 0.4000 mg | SUBLINGUAL_TABLET | SUBLINGUAL | 3 refills | Status: DC | PRN

## 2018-06-10 NOTE — Patient Instructions (Signed)

## 2018-06-19 DIAGNOSIS — Z6826 Body mass index (BMI) 26.0-26.9, adult: Secondary | ICD-10-CM | POA: Diagnosis not present

## 2018-06-19 DIAGNOSIS — Z299 Encounter for prophylactic measures, unspecified: Secondary | ICD-10-CM | POA: Diagnosis not present

## 2018-06-19 DIAGNOSIS — I10 Essential (primary) hypertension: Secondary | ICD-10-CM | POA: Diagnosis not present

## 2018-06-19 DIAGNOSIS — Z713 Dietary counseling and surveillance: Secondary | ICD-10-CM | POA: Diagnosis not present

## 2018-06-19 DIAGNOSIS — I2510 Atherosclerotic heart disease of native coronary artery without angina pectoris: Secondary | ICD-10-CM | POA: Diagnosis not present

## 2018-09-09 ENCOUNTER — Other Ambulatory Visit: Payer: Self-pay | Admitting: Cardiology

## 2018-09-25 ENCOUNTER — Other Ambulatory Visit: Payer: Self-pay | Admitting: Cardiology

## 2018-09-30 ENCOUNTER — Other Ambulatory Visit: Payer: Self-pay | Admitting: Cardiology

## 2018-09-30 MED ORDER — NITROGLYCERIN 0.4 MG SL SUBL: 0.4000 mg | SUBLINGUAL_TABLET | SUBLINGUAL | 3 refills | Status: DC | PRN

## 2018-09-30 MED ORDER — LOSARTAN POTASSIUM 50 MG PO TABS: 50.0000 mg | ORAL_TABLET | Freq: Every day | ORAL | 1 refills | Status: DC

## 2018-09-30 MED ORDER — CARVEDILOL 6.25 MG PO TABS: 6.2500 mg | ORAL_TABLET | Freq: Two times a day (BID) | ORAL | 2 refills | Status: DC

## 2018-09-30 MED ORDER — SPIRONOLACTONE 25 MG PO TABS: 12.5000 mg | ORAL_TABLET | Freq: Every day | ORAL | 2 refills | Status: DC

## 2018-09-30 NOTE — Telephone Encounter (Signed)
Patient changing pharmacy  Please send all cardiac medications tot [facility] in [place], [place]

## 2018-10-01 ENCOUNTER — Other Ambulatory Visit: Payer: Self-pay | Admitting: Cardiology

## 2018-10-01 MED ORDER — ATORVASTATIN CALCIUM 40 MG PO TABS: ORAL_TABLET | ORAL | 2 refills | Status: DC

## 2018-10-01 NOTE — Telephone Encounter (Signed)
°*  STAT* If patient is at the pharmacy, call can be transferred to refill team.   1. Which medications need to be refilled? (please list name of each medication and dose if known)    LIPITOR) 40 MG tablet    2. Which pharmacy/location (including street and city if local pharmacy) is medication to be sent to?   [facility]   3. Do they need a 30 day or 90 day supply?

## 2018-10-22 ENCOUNTER — Other Ambulatory Visit: Payer: Self-pay | Admitting: Cardiology

## 2018-10-30 ENCOUNTER — Telehealth: Payer: Self-pay | Admitting: Cardiology

## 2018-10-30 MED ORDER — CLOPIDOGREL BISULFATE 75 MG PO TABS: 75.0000 mg | ORAL_TABLET | Freq: Every day | ORAL | 3 refills | Status: DC

## 2018-10-30 NOTE — Telephone Encounter (Signed)
Daughter in law informed that plavix was removed from list at last office visit when medications were reviewed because patient took herself off of it. Maddie advised that according to Dr. Myles Gip last office note, patient is supposed to be on dual antiplatelet therapy (aspirin & plavix) New rx sent to [facility].

## 2018-10-30 NOTE — Telephone Encounter (Signed)
Maddie walked in asking to have nurse call to go over patient's medications.  Stated that [facility] has a refill ready for Plavix and she did not think that she was still taking Plavix

## 2018-11-20 DIAGNOSIS — Z789 Other specified health status: Secondary | ICD-10-CM | POA: Diagnosis not present

## 2018-11-20 DIAGNOSIS — R42 Dizziness and giddiness: Secondary | ICD-10-CM | POA: Diagnosis not present

## 2018-11-20 DIAGNOSIS — I10 Essential (primary) hypertension: Secondary | ICD-10-CM | POA: Diagnosis not present

## 2018-11-20 DIAGNOSIS — R200 Anesthesia of skin: Secondary | ICD-10-CM | POA: Diagnosis not present

## 2018-11-20 DIAGNOSIS — M79606 Pain in leg, unspecified: Secondary | ICD-10-CM | POA: Diagnosis not present

## 2018-11-20 DIAGNOSIS — Z6826 Body mass index (BMI) 26.0-26.9, adult: Secondary | ICD-10-CM | POA: Diagnosis not present

## 2018-11-20 DIAGNOSIS — Z299 Encounter for prophylactic measures, unspecified: Secondary | ICD-10-CM | POA: Diagnosis not present

## 2018-12-11 NOTE — Progress Notes (Signed)
Cardiology Office Note  Date: 12/12/2018   ID: Debra Doyle, DOB 1929-01-22, MRN [id number]  PCP: [name], MD  Primary Cardiologist: Rozann Lesches, MD   Chief Complaint  Patient presents with  . Coronary Artery Disease    History of Present Illness: Debra Doyle is an 85 y.o. female last seen in July 2019.  She presents today with her granddaughter for follow-up visit.  She does not report any angina symptoms or nitroglycerin use.  She remains functional with ADLs, uses a cane but has had no dizziness or falls.  Blood pressure is elevated today.  She states that it is generally not elevated when she checks it at other medical visits.  She states that she takes her medications regularly.  I reviewed her current list, there have been no significant changes from a cardiac perspective.  We are requesting her interval lab work from Dr. Manuella Ghazi.  Past Medical History:  Diagnosis Date  . Coronary atherosclerosis of native coronary artery    DES x 4 to LAD 1/12  . Essential hypertension, benign   . Fibrocystic breast disease   . Hx of colonic polyps   . Ischemic cardiomyopathy   . Myocardial infarction [facility])    NSTEMI 1/12  . ST elevation (STEMI) myocardial infarction involving left anterior descending coronary artery [facility])    January 2015 - LAD stent thrombosis off DAPT    Past Surgical History:  Procedure Laterality Date  . CATARACT EXTRACTION, BILATERAL     Bilateral  . LEFT HEART CATHETERIZATION WITH CORONARY ANGIOGRAM N/A 11/25/2013   Procedure: LEFT HEART CATHETERIZATION WITH CORONARY ANGIOGRAM;  Surgeon: Sinclair Grooms, MD;  Location: [facility] CATH LAB;  Service: Cardiovascular;  Laterality: N/A;  . PERCUTANEOUS CORONARY STENT INTERVENTION (PCI-S)  11/25/2013   Procedure: PERCUTANEOUS CORONARY STENT INTERVENTION (PCI-S);  Surgeon: Sinclair Grooms, MD;  Location: [facility] CATH LAB;  Service: Cardiovascular;;    Current Outpatient Medications  Medication Sig Dispense Refill    . Ascorbic Acid (VITAMIN C) 500 MG tablet Take 500 mg by mouth daily.      Marland Kitchen aspirin EC 81 MG tablet Take 1 tablet (81 mg total) by mouth daily.    Marland Kitchen atorvastatin (LIPITOR) 40 MG tablet TAKE 1 TABLET (40 MG TOTAL) BY MOUTH DAILY AT 6 PM. 90 tablet 2  . Bilberry 100 MG CAPS Take by mouth daily.      . carvedilol (COREG) 6.25 MG tablet Take 1 tablet (6.25 mg total) by mouth 2 (two) times daily. 180 tablet 2  . Cinnamon 500 MG capsule Take 500 mg by mouth daily.      . clopidogrel (PLAVIX) 75 MG tablet Take 1 tablet (75 mg total) by mouth daily. 90 tablet 3  . Glucosamine 500 MG CAPS Take by mouth 2 (two) times daily.      Marland Kitchen losartan (COZAAR) 50 MG tablet Take 1 tablet (50 mg total) by mouth daily. 90 tablet 1  . Magnesium 250 MG TABS Take 1 tablet by mouth daily.    . Multiple Vitamin (MULTIVITAMIN) tablet Take 1 tablet by mouth daily.      . nitroGLYCERIN (NITROSTAT) 0.4 MG SL tablet Place 1 tablet (0.4 mg total) under the tongue every 5 (five) minutes x 3 doses as needed (if no relief after 3rd dose, proceed to the ED for an evaluation). 25 tablet 3  . Omega-3 Fatty Acids (FISH OIL) 1000 MG CAPS Take by mouth daily.      Marland Kitchen  spironolactone (ALDACTONE) 25 MG tablet TAKE 0.5 TABLETS (12.5 MG TOTAL) BY MOUTH DAILY. 45 tablet 3   No current facility-administered medications for this visit.    Allergies:  Patient has no known allergies.   Social History: The patient  reports that she has never smoked. She has never used smokeless tobacco. She reports that she does not drink alcohol or use drugs.   ROS:  Please see the history of present illness. Otherwise, complete review of systems is positive for hearing loss, arthritic pain and stiffness.  All other systems are reviewed and negative.   Physical Exam: VS:  BP (!) 180/90 Comment: having alot of back pain today  Pulse 80   Ht 5\' 2"  (1.575 m)   Wt 151 lb 6.4 oz (68.7 kg)   SpO2 98%   BMI 27.69 kg/m , BMI Body mass index is 27.69 kg/m.  Wt  Readings from Last 3 Encounters:  12/12/18 151 lb 6.4 oz (68.7 kg)  06/10/18 151 lb 12.8 oz (68.9 kg)  02/23/17 150 lb (68 kg)    General: Elderly woman, appears comfortable at rest. HEENT: Conjunctiva and lids normal, oropharynx clear. Neck: Supple, no elevated JVP or carotid bruits, no thyromegaly. Lungs: Clear to auscultation, nonlabored breathing at rest. Cardiac: Regular rate and rhythm, no S3, soft systolic murmur. Abdomen: Soft, nontender, bowel sounds present. Extremities: No pitting edema, distal pulses 2+. Skin: Warm and dry. Musculoskeletal: Mild kyphosis. Neuropsychiatric: Alert and oriented x3, affect grossly appropriate.  ECG: I personally reviewed the tracing from 06/10/2018 which showed sinus rhythm with left bundle branch block.  Recent Labwork:  October 2016: Cholesterol 128, triglycerides 70, HDL 57, LDL 57, BUN 18, creatinine 0.8, potassium 4.7, AST 23, ALT 15, TSH 2.09, hemoglobin 14.5, platelets 232  Other Studies Reviewed Today:  Echocardiogram 01/05/2016: Study Conclusions  - Left ventricle: The cavity size was normal. Wall thickness was increased in a pattern of mild LVH. Systolic function was normal. The estimated ejection fraction was in the range of 50% to 55%. Doppler parameters are consistent with abnormal left ventricular relaxation (grade 1 diastolic dysfunction). - Aortic valve: Valve area (VTI): 2.54 cm^2. Valve area (Vmax): 2.54 cm^2. Valve area (Vmean): 2.75 cm^2. - Technically difficult study.  Assessment and Plan:  1.  CAD with history of DES x4 to the LAD in 2012 with subsequent stent thrombosis in 2015 requiring repeat intervention.  She remains on long-term dual antiplatelet therapy.  No active angina at this time.  2.  Mixed hyperlipidemia on Lipitor.  We are requesting her lab work from Dr. Manuella Ghazi.  3.  Ischemic cardiomyopathy with LVEF 50 to 55% by previous assessment.  4.  Essential hypertension, blood pressure elevated  today.  She states that she has a blood pressure check with Dr. Manuella Ghazi next month.  May need to uptitrate Cozaar.  Current medicines were reviewed with the patient today.  Disposition: Follow-up in 6 months.  Signed, Satira Sark, MD, [facility] 12/12/2018 2:19 PM    [facility] [address], [place], [place] [address] Phone: [phone]; Fax: [phone]

## 2018-12-12 ENCOUNTER — Encounter: Payer: Self-pay | Admitting: Cardiology

## 2018-12-12 ENCOUNTER — Ambulatory Visit: Payer: PPO | Admitting: Cardiology

## 2018-12-12 ENCOUNTER — Encounter: Payer: Self-pay | Admitting: *Deleted

## 2018-12-12 VITALS — BP 180/90 | HR 80 | Ht 62.0 in | Wt 151.4 lb

## 2018-12-12 DIAGNOSIS — I10 Essential (primary) hypertension: Secondary | ICD-10-CM

## 2018-12-12 DIAGNOSIS — E782 Mixed hyperlipidemia: Secondary | ICD-10-CM

## 2018-12-12 DIAGNOSIS — I25119 Atherosclerotic heart disease of native coronary artery with unspecified angina pectoris: Secondary | ICD-10-CM | POA: Diagnosis not present

## 2018-12-12 DIAGNOSIS — I255 Ischemic cardiomyopathy: Secondary | ICD-10-CM

## 2018-12-12 NOTE — Patient Instructions (Addendum)

## 2018-12-16 ENCOUNTER — Other Ambulatory Visit: Payer: Self-pay | Admitting: Cardiology

## 2019-01-17 ENCOUNTER — Encounter (HOSPITAL_COMMUNITY): Payer: Self-pay | Admitting: Emergency Medicine

## 2019-01-17 ENCOUNTER — Emergency Department (HOSPITAL_COMMUNITY): Payer: PPO

## 2019-01-17 ENCOUNTER — Emergency Department (HOSPITAL_COMMUNITY)
Admission: EM | Admit: 2019-01-17 | Discharge: 2019-01-17 | Disposition: A | Discharge status: Home / Self Care | Payer: PPO | Attending: Emergency Medicine | Admitting: Emergency Medicine

## 2019-01-17 ENCOUNTER — Other Ambulatory Visit: Payer: Self-pay

## 2019-01-17 DIAGNOSIS — Z79899 Other long term (current) drug therapy: Secondary | ICD-10-CM | POA: Diagnosis not present

## 2019-01-17 DIAGNOSIS — M549 Dorsalgia, unspecified: Secondary | ICD-10-CM | POA: Diagnosis not present

## 2019-01-17 DIAGNOSIS — R52 Pain, unspecified: Secondary | ICD-10-CM | POA: Diagnosis not present

## 2019-01-17 DIAGNOSIS — Y999 Unspecified external cause status: Secondary | ICD-10-CM | POA: Insufficient documentation

## 2019-01-17 DIAGNOSIS — M545 Low back pain, unspecified: Secondary | ICD-10-CM

## 2019-01-17 DIAGNOSIS — Z7982 Long term (current) use of aspirin: Secondary | ICD-10-CM | POA: Insufficient documentation

## 2019-01-17 DIAGNOSIS — I252 Old myocardial infarction: Secondary | ICD-10-CM | POA: Insufficient documentation

## 2019-01-17 DIAGNOSIS — S32020A Wedge compression fracture of second lumbar vertebra, initial encounter for closed fracture: Secondary | ICD-10-CM | POA: Insufficient documentation

## 2019-01-17 DIAGNOSIS — W19XXXA Unspecified fall, initial encounter: Secondary | ICD-10-CM | POA: Diagnosis not present

## 2019-01-17 DIAGNOSIS — Y929 Unspecified place or not applicable: Secondary | ICD-10-CM | POA: Insufficient documentation

## 2019-01-17 DIAGNOSIS — Z7902 Long term (current) use of antithrombotics/antiplatelets: Secondary | ICD-10-CM | POA: Insufficient documentation

## 2019-01-17 DIAGNOSIS — Y939 Activity, unspecified: Secondary | ICD-10-CM | POA: Diagnosis not present

## 2019-01-17 DIAGNOSIS — I10 Essential (primary) hypertension: Secondary | ICD-10-CM | POA: Insufficient documentation

## 2019-01-17 DIAGNOSIS — M5489 Other dorsalgia: Secondary | ICD-10-CM | POA: Diagnosis not present

## 2019-01-17 DIAGNOSIS — X58XXXA Exposure to other specified factors, initial encounter: Secondary | ICD-10-CM | POA: Insufficient documentation

## 2019-01-17 LAB — URINALYSIS, ROUTINE W REFLEX MICROSCOPIC
Bacteria, UA: NONE SEEN
Bilirubin Urine: NEGATIVE
Glucose, UA: NEGATIVE mg/dL
Ketones, ur: NEGATIVE mg/dL
Leukocytes,Ua: NEGATIVE
Nitrite: NEGATIVE
PH: 6 (ref 5.0–8.0)
Protein, ur: NEGATIVE mg/dL
Specific Gravity, Urine: 1.006 (ref 1.005–1.030)

## 2019-01-17 MED ORDER — HYDROCODONE-ACETAMINOPHEN 5-325 MG PO TABS: ORAL_TABLET | ORAL | 0 refills | Status: DC

## 2019-01-17 MED ORDER — OXYCODONE-ACETAMINOPHEN 5-325 MG PO TABS
1.0000 | ORAL_TABLET | Freq: Once | ORAL | Status: AC
  Administered 2019-01-17: 1 via ORAL
  Filled 2019-01-17: qty 1

## 2019-01-17 MED ORDER — MORPHINE SULFATE (PF) 4 MG/ML IV SOLN
4.0000 mg | Freq: Once | INTRAVENOUS | Status: AC
  Administered 2019-01-17: 4 mg via INTRAMUSCULAR
  Filled 2019-01-17: qty 1

## 2019-01-17 MED ORDER — METHOCARBAMOL 500 MG PO TABS
500.0000 mg | ORAL_TABLET | Freq: Once | ORAL | Status: AC
  Administered 2019-01-17: 500 mg via ORAL
  Filled 2019-01-17: qty 1

## 2019-01-17 MED ORDER — METHOCARBAMOL 500 MG PO TABS: 500.0000 mg | ORAL_TABLET | Freq: Two times a day (BID) | ORAL | 0 refills | Status: DC | PRN

## 2019-01-17 MED ORDER — HYDROCODONE-ACETAMINOPHEN 5-325 MG PO TABS
1.0000 | ORAL_TABLET | Freq: Once | ORAL | Status: AC
  Administered 2019-01-17: 1 via ORAL
  Filled 2019-01-17: qty 1

## 2019-01-17 NOTE — ED Notes (Signed)
Pt walked with the assistance of a walker and stand-by assistance of this RN. Pt walked to bathroom and back to room

## 2019-01-17 NOTE — Discharge Instructions (Signed)
Your MRI showed a previous compression fracture of L2 (not new/acute). Take the prescriptions as directed.  Apply moist heat or ice to the area(s) of discomfort, for 15 minutes at a time, several times per day for the next few days.  Do not fall asleep on a heating or ice pack.  Call your regular medical doctor on Monday to schedule a follow up appointment within the next 3 days.  Return to the Emergency Department immediately if worsening.

## 2019-01-17 NOTE — ED Provider Notes (Signed)
[facility] EMERGENCY DEPARTMENT Provider Note   CSN: [id number] Arrival date & time: 01/17/19  1702    History   Chief Complaint Chief Complaint  Patient presents with  . Back Pain    HPI Debra Doyle is a 85 y.o. female.     HPI Pt was seen at 1735. Per pt, c/o gradual onset and persistence of constant right sided low back "pain" for the past 3 days. States the pain began after she "did a lot of walking" the day before.  Pain worsens with palpation of the area and body position changes. Denies incont/retention of bowel or bladder, no saddle anesthesia, no focal motor weakness, no tingling/numbness in extremities, no fevers, no injury, no abd pain, no CP/SOB. The symptoms have been associated with no other complaints.     Past Medical History:  Diagnosis Date  . Coronary atherosclerosis of native coronary artery    DES x 4 to LAD 1/12  . Essential hypertension, benign   . Fibrocystic breast disease   . Hx of colonic polyps   . Ischemic cardiomyopathy   . Myocardial infarction [facility])    NSTEMI 1/12  . ST elevation (STEMI) myocardial infarction involving left anterior descending coronary artery [facility])    January 2015 - LAD stent thrombosis off DAPT    Patient Active Problem List   Diagnosis Date Noted  . Closed fracture of third metatarsal bone of right foot 11/26/17 01/21/2018  . Closed nondisplaced fracture of proximal phalanx of right little finger 01/21/2018  . Ischemic cardiomyopathy 01/09/2014  . Mixed hyperlipidemia 12/29/2010  . Essential hypertension, benign 12/29/2010  . Coronary atherosclerosis of native coronary artery 12/29/2010    Past Surgical History:  Procedure Laterality Date  . CATARACT EXTRACTION, BILATERAL     Bilateral  . LEFT HEART CATHETERIZATION WITH CORONARY ANGIOGRAM N/A 11/25/2013   Procedure: LEFT HEART CATHETERIZATION WITH CORONARY ANGIOGRAM;  Surgeon: Sinclair Grooms, MD;  Location: [facility] CATH LAB;  Service: Cardiovascular;  Laterality:  N/A;  . PERCUTANEOUS CORONARY STENT INTERVENTION (PCI-S)  11/25/2013   Procedure: PERCUTANEOUS CORONARY STENT INTERVENTION (PCI-S);  Surgeon: Sinclair Grooms, MD;  Location: [facility] CATH LAB;  Service: Cardiovascular;;     OB History   No obstetric history on file.      Home Medications    Prior to Admission medications   Medication Sig Start Date End Date Taking? Authorizing Provider  Ascorbic Acid (VITAMIN C) 500 MG tablet Take 500 mg by mouth daily.      [provider]  aspirin EC 81 MG tablet Take 1 tablet (81 mg total) by mouth daily. 11/25/14   Satira Sark, MD  atorvastatin (LIPITOR) 40 MG tablet TAKE 1 TABLET (40 MG TOTAL) BY MOUTH DAILY AT 6 PM. 10/01/18   Satira Sark, MD  Bilberry 100 MG CAPS Take by mouth daily.      [provider]  carvedilol (COREG) 6.25 MG tablet Take 1 tablet (6.25 mg total) by mouth 2 (two) times daily. 09/30/18   Satira Sark, MD  Cinnamon 500 MG capsule Take 500 mg by mouth daily.      [provider]  clopidogrel (PLAVIX) 75 MG tablet Take 1 tablet (75 mg total) by mouth daily. 10/30/18   Satira Sark, MD  Glucosamine 500 MG CAPS Take by mouth 2 (two) times daily.      [provider]  losartan (COZAAR) 50 MG tablet Take 1 tablet (50 mg total) by mouth  daily. 09/30/18   Satira Sark, MD  Magnesium 250 MG TABS Take 1 tablet by mouth daily.    [provider]  Multiple Vitamin (MULTIVITAMIN) tablet Take 1 tablet by mouth daily.      [provider]  nitroGLYCERIN (NITROSTAT) 0.4 MG SL tablet PLACE 1 TABLET UNDER THE TONGUE EVERY 5MINS X 3 DOSES AS NEEDED 12/16/18   Satira Sark, MD  Omega-3 Fatty Acids (FISH OIL) 1000 MG CAPS Take by mouth daily.      [provider]  spironolactone (ALDACTONE) 25 MG tablet TAKE 0.5 TABLETS (12.5 MG TOTAL) BY MOUTH DAILY. 10/22/18   Satira Sark, MD    Family History Family History  Problem Relation Age of Onset  .  Coronary artery disease Mother   . Emphysema Father   . Cancer Sister        1 sister/cancer type unknown  . Stroke Brother        1 brother    Social History Social History   Tobacco Use  . Smoking status: Never Smoker  . Smokeless tobacco: Never Used  Substance Use Topics  . Alcohol use: No  . Drug use: No     Allergies   Patient has no known allergies.   Review of Systems Review of Systems ROS: Statement: All systems negative except as marked or noted in the HPI; Constitutional: Negative for fever and chills. ; ; Eyes: Negative for eye pain, redness and discharge. ; ; ENMT: Negative for ear pain, hoarseness, nasal congestion, sinus pressure and sore throat. ; ; Cardiovascular: Negative for chest pain, palpitations, diaphoresis, dyspnea and peripheral edema. ; ; Respiratory: Negative for cough, wheezing and stridor. ; ; Gastrointestinal: Negative for nausea, vomiting, diarrhea, abdominal pain, blood in stool, hematemesis, jaundice and rectal bleeding. . ; ; Genitourinary: Negative for dysuria, flank pain and hematuria. ; ; Musculoskeletal: +LBP. Negative for neck pain. Negative for swelling and trauma.; ; Skin: Negative for pruritus, rash, abrasions, blisters, bruising and skin lesion.; ; Neuro: Negative for headache, lightheadedness and neck stiffness. Negative for weakness, altered level of consciousness, altered mental status, extremity weakness, paresthesias, involuntary movement, seizure and syncope.       Physical Exam Updated Vital Signs BP (!) 154/94 (BP Location: Left Arm)   Pulse 100   Temp 97.8 F (36.6 C) (Oral)   SpO2 96%   Physical Exam 1740: Physical examination:  Nursing notes reviewed; Vital signs and O2 SAT reviewed;  Constitutional: Well developed, Well nourished, Well hydrated, In no acute distress; Head:  Normocephalic, atraumatic; Eyes: EOMI, PERRL, No scleral icterus; ENMT: Mouth and pharynx normal, Mucous membranes moist; Neck: Supple, Full range of  motion, No lymphadenopathy; Cardiovascular: Regular rate and rhythm, No gallop; Respiratory: Breath sounds clear & equal bilaterally, No wheezes.  Speaking full sentences with ease, Normal respiratory effort/excursion; Chest: Nontender, Movement normal; Abdomen: Soft, Nontender, Nondistended, Normal bowel sounds; Genitourinary: No CVA tenderness; Spine:  No midline CS, TS, LS tenderness. +TTP right lumbar paraspinal muscles. No rash.;; Extremities: Peripheral pulses normal, No tenderness, No edema, No calf edema or asymmetry.; Neuro: AA&Ox3, Major CN grossly intact.  Speech clear. No gross focal motor or sensory deficits in extremities. Strength 5/5 equal bilat UE's and LE's, including great toe dorsiflexion.  DTR 2/4 equal bilat UE's and LE's.  No gross sensory deficits.  Neg straight leg raises bilat.; Skin: Color normal, Warm, Dry.   ED Treatments / Results  Labs (all labs ordered are listed, but only abnormal results  are displayed)   EKG None  Radiology   Procedures Procedures (including critical care time)  Medications Ordered in ED Medications  HYDROcodone-acetaminophen (NORCO/VICODIN) 5-325 MG per tablet 1 tablet (1 tablet Oral Given 01/17/19 1753)     Initial Impression / Assessment and Plan / ED Course  I have reviewed the triage vital signs and the nursing notes.  Pertinent labs & imaging results that were available during my care of the patient were reviewed by me and considered in my medical decision making (see chart for details).     MDM Reviewed: previous chart, nursing note and vitals Interpretation: x-ray, labs and MRI    Results for orders placed or performed during the hospital encounter of 01/17/19  Urinalysis, Routine w reflex microscopic  Result Value Ref Range   Color, Urine YELLOW YELLOW   APPearance CLEAR CLEAR   Specific Gravity, Urine 1.006 1.005 - 1.030   pH 6.0 5.0 - 8.0   Glucose, UA NEGATIVE NEGATIVE mg/dL   Hgb urine dipstick SMALL (A)  NEGATIVE   Bilirubin Urine NEGATIVE NEGATIVE   Ketones, ur NEGATIVE NEGATIVE mg/dL   Protein, ur NEGATIVE NEGATIVE mg/dL   Nitrite NEGATIVE NEGATIVE   Leukocytes,Ua NEGATIVE NEGATIVE   RBC / HPF 6-10 0 - 5 RBC/hpf   WBC, UA 0-5 0 - 5 WBC/hpf   Bacteria, UA NONE SEEN NONE SEEN   Squamous Epithelial / LPF 0-5 0 - 5   Dg Lumbar Spine Complete Result Date: 01/17/2019 CLINICAL DATA:  Low back and right hip pain for the past 2 days. No known injury. EXAM: LUMBAR SPINE - COMPLETE 4+ VIEW COMPARISON:  None. FINDINGS: Five non-rib-bearing lumbar vertebrae. Mild scoliosis. Diffuse osteopenia, making visualization of the bone detail more difficult. Facet degenerative changes throughout the lumbar spine with associated grade 1 retrolisthesis at the L3-4 level. There is an approximately 20% L2 vertebral inferior endplate compression deformity with sclerosis and no visible fracture lines. No pars defects are seen. Atheromatous arterial calcifications. Prominent stool. IMPRESSION: 1. Old approximately 20% L2 inferior endplate compression fracture. No acute fracture seen. 2. Facet degenerative changes throughout the lumbar spine with associated grade 1 retrolisthesis at the L3-4 level. 3. Prominent stool. Electronically Signed   By: Claudie Revering M.D.   On: 01/17/2019 19:02   Mr Lumbar Spine Wo Contrast Result Date: 01/17/2019 CLINICAL DATA:  Severe back pain. No known trauma. Osteoporosis. Chronic steroid use. EXAM: MRI LUMBAR SPINE WITHOUT CONTRAST TECHNIQUE: Multiplanar, multisequence MR imaging of the lumbar spine was performed. No intravenous contrast was administered. COMPARISON:  None. FINDINGS: Segmentation: 5 non rib-bearing lumbar type vertebral bodies are present. The lowest fully formed vertebral body is L5. Alignment: Grade 1 anterolisthesis at L4-5 measures 5 mm. There is slight retrolisthesis at T12-L1 and L1-2. Vertebrae: Edematous marrow changes along the inferior aspect of L2 consistent with a subacute  fracture. No mass lesion is present. Marrow signal changes do not extend into the pedicles. Hemangioma is present at L5. Chronic edematous endplate marrow changes are present on the right at T12-L1. Conus medullaris and cauda equina: Conus extends to the L1-2 level. Conus and cauda equina appear normal. Paraspinal and other soft tissues: Limited imaging the abdomen is unremarkable. There is no significant adenopathy. No solid organ lesions are present. Disc levels: T12-L1: Negative. L1-2: Facet hypertrophy is present bilaterally. There is uncovering of a mild disc bulge. Central canal is patent. Mild foraminal narrowing is present bilaterally. L2-3: Mild facet hypertrophy is present bilaterally. Foramina are patent bilaterally. L3-4:  A broad-based disc protrusion is asymmetric to the left. This results in mild left subarticular and foraminal narrowing. L4-5: There is uncovering of a broad-based disc protrusion. The central canal is patent. Foramina are patent bilaterally. L5-S1: Chronic loss of disc height is present. No significant stenosis is present. IMPRESSION: 1. Subacute inferior endplate compression fracture at L2 with less than 20% loss of height and no retropulsed bone. 2. Mild foraminal narrowing bilaterally at L1-2. 3. Mild left subarticular and foraminal narrowing at L3-4. 4. Grade 1 anterolisthesis at L4-5 without significant stenosis. Electronically Signed   By: San Morelle M.D.   On: 01/17/2019 21:28   Dg Hip Unilat With Pelvis 2-3 Views Right Result Date: 01/17/2019 CLINICAL DATA:  Low back pain EXAM: DG HIP (WITH OR WITHOUT PELVIS) 2-3V RIGHT COMPARISON:  None. FINDINGS: Moderate retained feces at the rectum. Pubic symphysis and rami appear intact. No fracture or malalignment. Vascular calcifications. IMPRESSION: No acute osseous abnormality. Electronically Signed   By: Donavan Foil M.D.   On: 01/17/2019 19:01    2210:  Abd benign, neuro exam intact/unchanged. Pt yelling and screaming  very loudly when she is moved, refuses to walk. Given multiple doses of pain meds as well as robaxin. XR reassuring. MRI obtained and is also without acute process. Pt informed of this next reassuring finding and that she would not be admitted to the hospital, but discharged home. Pt stopped screaming and said "oh." Pt then stopped any further screaming at the top of her voice, got out of bed with stand by assistance, and walked down the hall to the bathroom and back to her room. Udip without UTI. Pt again asked if she would be admitted, and again informed her and her family of her reassuring diagnostic testing results, and that she would be discharged. Pt then stated "oh ok," and is ready to go home now. Family will take her home. Dx and testing d/w pt and family.  Questions answered.  Verb understanding, agreeable to d/c home with outpt f/u.     Final Clinical Impressions(s) / ED Diagnoses   Final diagnoses:  None    ED Discharge Orders    None       Francine Graven, DO 01/19/19 2155

## 2019-01-17 NOTE — ED Notes (Signed)
Pt is complaining of lower back pain

## 2019-01-17 NOTE — ED Triage Notes (Signed)
Pt c/o lower back pain x2 days. Pt denies recent injury. States she did a lot of walking around that day, went to bed, when she woke up she had the back pain. Pain worsens with movement.

## 2019-01-17 NOTE — ED Notes (Signed)
Pt screamed when we moved the bed to get the Pt up. Pt was screaming to the top of her lungs saying she can not move or get up.

## 2019-01-19 LAB — URINE CULTURE: Culture: <10000 — AB

## 2019-01-22 ENCOUNTER — Other Ambulatory Visit: Payer: Self-pay | Admitting: *Deleted

## 2019-01-22 DIAGNOSIS — M62838 Other muscle spasm: Secondary | ICD-10-CM | POA: Diagnosis not present

## 2019-01-22 DIAGNOSIS — I2510 Atherosclerotic heart disease of native coronary artery without angina pectoris: Secondary | ICD-10-CM | POA: Diagnosis not present

## 2019-01-22 DIAGNOSIS — Z299 Encounter for prophylactic measures, unspecified: Secondary | ICD-10-CM | POA: Diagnosis not present

## 2019-01-22 DIAGNOSIS — I10 Essential (primary) hypertension: Secondary | ICD-10-CM | POA: Diagnosis not present

## 2019-01-22 DIAGNOSIS — M545 Low back pain: Secondary | ICD-10-CM | POA: Diagnosis not present

## 2019-01-22 DIAGNOSIS — Z6826 Body mass index (BMI) 26.0-26.9, adult: Secondary | ICD-10-CM | POA: Diagnosis not present

## 2019-01-22 NOTE — Patient Outreach (Addendum)
Referral received from [facility], referral states pt wants Education officer, museum,  RN CM placed outreach call to pt for screening, spoke with pt, HIPAA verified, pt prefers RN CM speak with her son Shanon Brow, spoke with [facility]) who reports he lives next door and checks on pt several times daily, states he has not been able to work because of checking on his mother frequently, Shanon Brow reports pt went to ED last Friday evening for back pain, had xray, MRI and given "5 pain pills and they didn't find anything"  Shanon Brow is taking pt to see primary care MD today for further assessment of back pain and will be discussing getting home health PT services.  Shanon Brow states pt has " slight depression but isn't willing to take any more medicine"  Reports pt did not qualify for medicaid but needs more assistance in the home and would like social worker to contact him for resources/ input for his mother.  RN CM mailed successful outreach letter to pt home with 24 hour nurse line magnet and consent form.  PLAN Order placed for social work referral  Jacqlyn Larsen [facility], [place] Coordinator [phone]

## 2019-01-23 ENCOUNTER — Telehealth: Payer: Self-pay | Admitting: *Deleted

## 2019-01-23 NOTE — Patient Outreach (Signed)
[facility]) Care Management  01/23/2019  TIERANY APPLEBY 1929-01-13 [id number]   CSW received referral from [facility] citing pt requests social work services. Per son Shanon Brow phone#: [phone], patient needs more assistance in the home and does not qualify for medicaid, Billee Cashing know what options there may be, he lives next door and checks on pt several times/ day. CSW called the number listed and spoke with David's wife, Maddie to discuss community resources, CSW informed her of [facility] [place] program which is $10/hour with a minimum of 2 hours each visit. Maddie took down the phone number to [facility] and states that she plans to call them this afternoon but has already spoken with a few other agencies as well but will certainly consider [employer] to compare. Maddie states no further CSW needs identified - CSW will close case at this time.    Raynaldo Opitz, LCSW [facility]  Clinical Social Worker cell #: [phone]

## 2019-01-24 ENCOUNTER — Other Ambulatory Visit: Payer: Self-pay | Admitting: Cardiology

## 2019-01-28 ENCOUNTER — Encounter (HOSPITAL_COMMUNITY): Payer: Self-pay | Admitting: Emergency Medicine

## 2019-01-28 ENCOUNTER — Emergency Department (HOSPITAL_COMMUNITY): Payer: PPO

## 2019-01-28 ENCOUNTER — Other Ambulatory Visit: Payer: Self-pay

## 2019-01-28 ENCOUNTER — Inpatient Hospital Stay (HOSPITAL_COMMUNITY)
Admission: EM | Admit: 2019-01-28 | Discharge: 2019-01-31 | DRG: 381 | Disposition: A | Discharge status: Skilled Nursing Facility | Payer: PPO | Attending: Internal Medicine | Admitting: Internal Medicine

## 2019-01-28 DIAGNOSIS — Z8719 Personal history of other diseases of the digestive system: Secondary | ICD-10-CM

## 2019-01-28 DIAGNOSIS — R4189 Other symptoms and signs involving cognitive functions and awareness: Secondary | ICD-10-CM | POA: Diagnosis present

## 2019-01-28 DIAGNOSIS — Z79899 Other long term (current) drug therapy: Secondary | ICD-10-CM

## 2019-01-28 DIAGNOSIS — K311 Adult hypertrophic pyloric stenosis: Principal | ICD-10-CM | POA: Diagnosis present

## 2019-01-28 DIAGNOSIS — I119 Hypertensive heart disease without heart failure: Secondary | ICD-10-CM | POA: Diagnosis present

## 2019-01-28 DIAGNOSIS — K2100 Gastro-esophageal reflux disease with esophagitis, without bleeding: Secondary | ICD-10-CM

## 2019-01-28 DIAGNOSIS — M4856XD Collapsed vertebra, not elsewhere classified, lumbar region, subsequent encounter for fracture with routine healing: Secondary | ICD-10-CM | POA: Diagnosis present

## 2019-01-28 DIAGNOSIS — S32020G Wedge compression fracture of second lumbar vertebra, subsequent encounter for fracture with delayed healing: Secondary | ICD-10-CM

## 2019-01-28 DIAGNOSIS — Z955 Presence of coronary angioplasty implant and graft: Secondary | ICD-10-CM

## 2019-01-28 DIAGNOSIS — S32020D Wedge compression fracture of second lumbar vertebra, subsequent encounter for fracture with routine healing: Secondary | ICD-10-CM | POA: Diagnosis not present

## 2019-01-28 DIAGNOSIS — I10 Essential (primary) hypertension: Secondary | ICD-10-CM

## 2019-01-28 DIAGNOSIS — Z4659 Encounter for fitting and adjustment of other gastrointestinal appliance and device: Secondary | ICD-10-CM

## 2019-01-28 DIAGNOSIS — E785 Hyperlipidemia, unspecified: Secondary | ICD-10-CM | POA: Diagnosis not present

## 2019-01-28 DIAGNOSIS — E860 Dehydration: Secondary | ICD-10-CM

## 2019-01-28 DIAGNOSIS — D72829 Elevated white blood cell count, unspecified: Secondary | ICD-10-CM | POA: Diagnosis present

## 2019-01-28 DIAGNOSIS — R402411 Glasgow coma scale score 13-15, in the field [EMT or ambulance]: Secondary | ICD-10-CM | POA: Diagnosis not present

## 2019-01-28 DIAGNOSIS — I255 Ischemic cardiomyopathy: Secondary | ICD-10-CM | POA: Diagnosis present

## 2019-01-28 DIAGNOSIS — I2510 Atherosclerotic heart disease of native coronary artery without angina pectoris: Secondary | ICD-10-CM | POA: Diagnosis present

## 2019-01-28 DIAGNOSIS — K269 Duodenal ulcer, unspecified as acute or chronic, without hemorrhage or perforation: Secondary | ICD-10-CM | POA: Diagnosis present

## 2019-01-28 DIAGNOSIS — Z8249 Family history of ischemic heart disease and other diseases of the circulatory system: Secondary | ICD-10-CM

## 2019-01-28 DIAGNOSIS — Z66 Do not resuscitate: Secondary | ICD-10-CM | POA: Diagnosis present

## 2019-01-28 DIAGNOSIS — S32020A Wedge compression fracture of second lumbar vertebra, initial encounter for closed fracture: Secondary | ICD-10-CM

## 2019-01-28 DIAGNOSIS — M4846XD Fatigue fracture of vertebra, lumbar region, subsequent encounter for fracture with routine healing: Secondary | ICD-10-CM | POA: Diagnosis not present

## 2019-01-28 DIAGNOSIS — Z7982 Long term (current) use of aspirin: Secondary | ICD-10-CM | POA: Diagnosis not present

## 2019-01-28 DIAGNOSIS — N179 Acute kidney failure, unspecified: Secondary | ICD-10-CM | POA: Diagnosis present

## 2019-01-28 DIAGNOSIS — R262 Difficulty in walking, not elsewhere classified: Secondary | ICD-10-CM | POA: Diagnosis not present

## 2019-01-28 DIAGNOSIS — R404 Transient alteration of awareness: Secondary | ICD-10-CM | POA: Diagnosis not present

## 2019-01-28 DIAGNOSIS — R112 Nausea with vomiting, unspecified: Secondary | ICD-10-CM | POA: Diagnosis not present

## 2019-01-28 DIAGNOSIS — R410 Disorientation, unspecified: Secondary | ICD-10-CM | POA: Diagnosis not present

## 2019-01-28 DIAGNOSIS — N6019 Diffuse cystic mastopathy of unspecified breast: Secondary | ICD-10-CM | POA: Diagnosis present

## 2019-01-28 DIAGNOSIS — K209 Esophagitis, unspecified: Secondary | ICD-10-CM | POA: Diagnosis not present

## 2019-01-28 DIAGNOSIS — K279 Peptic ulcer, site unspecified, unspecified as acute or chronic, without hemorrhage or perforation: Secondary | ICD-10-CM | POA: Diagnosis not present

## 2019-01-28 DIAGNOSIS — I252 Old myocardial infarction: Secondary | ICD-10-CM | POA: Diagnosis not present

## 2019-01-28 DIAGNOSIS — M549 Dorsalgia, unspecified: Secondary | ICD-10-CM | POA: Diagnosis not present

## 2019-01-28 DIAGNOSIS — I959 Hypotension, unspecified: Secondary | ICD-10-CM | POA: Diagnosis present

## 2019-01-28 DIAGNOSIS — M6281 Muscle weakness (generalized): Secondary | ICD-10-CM | POA: Diagnosis not present

## 2019-01-28 DIAGNOSIS — K5900 Constipation, unspecified: Secondary | ICD-10-CM | POA: Diagnosis present

## 2019-01-28 DIAGNOSIS — R41841 Cognitive communication deficit: Secondary | ICD-10-CM | POA: Diagnosis not present

## 2019-01-28 DIAGNOSIS — R918 Other nonspecific abnormal finding of lung field: Secondary | ICD-10-CM | POA: Diagnosis not present

## 2019-01-28 DIAGNOSIS — Z931 Gastrostomy status: Secondary | ICD-10-CM | POA: Diagnosis not present

## 2019-01-28 DIAGNOSIS — E782 Mixed hyperlipidemia: Secondary | ICD-10-CM | POA: Diagnosis present

## 2019-01-28 DIAGNOSIS — I9589 Other hypotension: Secondary | ICD-10-CM | POA: Diagnosis not present

## 2019-01-28 DIAGNOSIS — Z741 Need for assistance with personal care: Secondary | ICD-10-CM | POA: Diagnosis not present

## 2019-01-28 DIAGNOSIS — R52 Pain, unspecified: Secondary | ICD-10-CM | POA: Diagnosis not present

## 2019-01-28 LAB — CBC WITH DIFFERENTIAL/PLATELET
Abs Immature Granulocytes: 0.06 10*3/uL (ref 0.00–0.07)
Basophils Absolute: 0 10*3/uL (ref 0.0–0.1)
Basophils Relative: 0 %
Eosinophils Absolute: 0 10*3/uL (ref 0.0–0.5)
Eosinophils Relative: 0 %
HEMATOCRIT: 39 % (ref 36.0–46.0)
Hemoglobin: 13.5 g/dL (ref 12.0–15.0)
Immature Granulocytes: 0 %
LYMPHS PCT: 3 %
Lymphs Abs: 0.5 10*3/uL — ABNORMAL LOW (ref 0.7–4.0)
MCH: 33.2 pg (ref 26.0–34.0)
MCHC: 34.6 g/dL (ref 30.0–36.0)
MCV: 95.8 fL (ref 80.0–100.0)
Monocytes Absolute: 0.7 10*3/uL (ref 0.1–1.0)
Monocytes Relative: 5 %
Neutro Abs: 13.4 10*3/uL — ABNORMAL HIGH (ref 1.7–7.7)
Neutrophils Relative %: 92 %
Platelets: 195 10*3/uL (ref 150–400)
RBC: 4.07 MIL/uL (ref 3.87–5.11)
RDW: 13.2 % (ref 11.5–15.5)
WBC: 14.7 10*3/uL — ABNORMAL HIGH (ref 4.0–10.5)
nRBC: 0 % (ref 0.0–0.2)

## 2019-01-28 LAB — TYPE AND SCREEN
ABO/RH(D): O POS
Antibody Screen: NEGATIVE

## 2019-01-28 LAB — COMPREHENSIVE METABOLIC PANEL
ALBUMIN: 2.7 g/dL — AB (ref 3.5–5.0)
ALT: 19 U/L (ref 0–44)
AST: 30 U/L (ref 15–41)
Alkaline Phosphatase: 76 U/L (ref 38–126)
Anion gap: 11 (ref 5–15)
BUN: 64 mg/dL — ABNORMAL HIGH (ref 8–23)
CO2: 17 mmol/L — ABNORMAL LOW (ref 22–32)
Calcium: 7.6 mg/dL — ABNORMAL LOW (ref 8.9–10.3)
Chloride: 103 mmol/L (ref 98–111)
Creatinine, Ser: 1.49 mg/dL — ABNORMAL HIGH (ref 0.44–1.00)
GFR calc Af Amer: 36 mL/min — ABNORMAL LOW (ref >60)
GFR calc non Af Amer: 31 mL/min — ABNORMAL LOW (ref >60)
GLUCOSE: 129 mg/dL — AB (ref 70–99)
Potassium: 4.2 mmol/L (ref 3.5–5.1)
Sodium: 131 mmol/L — ABNORMAL LOW (ref 135–145)
Total Bilirubin: 0.6 mg/dL (ref 0.3–1.2)
Total Protein: 5.6 g/dL — ABNORMAL LOW (ref 6.5–8.1)

## 2019-01-28 LAB — PROTIME-INR
INR: 1.4 — ABNORMAL HIGH (ref 0.8–1.2)
Prothrombin Time: 16.7 seconds — ABNORMAL HIGH (ref 11.4–15.2)

## 2019-01-28 LAB — LACTIC ACID, PLASMA
Lactic Acid, Venous: 2 mmol/L (ref 0.5–1.9)
Lactic Acid, Venous: 1.1 mmol/L (ref 0.5–1.9)

## 2019-01-28 LAB — POC OCCULT BLOOD, ED: Fecal Occult Bld: POSITIVE — AB

## 2019-01-28 MED ORDER — IOHEXOL 300 MG/ML  SOLN
75.0000 mL | Freq: Once | INTRAMUSCULAR | Status: AC | PRN
  Administered 2019-01-28: 75 mL via INTRAVENOUS

## 2019-01-28 MED ORDER — SODIUM CHLORIDE 0.9 % IV SOLN
INTRAVENOUS | Status: AC
  Administered 2019-01-28 – 2019-01-29 (×3): via INTRAVENOUS

## 2019-01-28 MED ORDER — SODIUM CHLORIDE 0.9 % IV BOLUS
1000.0000 mL | Freq: Once | INTRAVENOUS | Status: AC
  Administered 2019-01-28: 1000 mL via INTRAVENOUS

## 2019-01-28 MED ORDER — ONDANSETRON HCL 4 MG/2ML IJ SOLN
4.0000 mg | Freq: Once | INTRAMUSCULAR | Status: AC
  Administered 2019-01-28: 4 mg via INTRAVENOUS
  Filled 2019-01-28: qty 2

## 2019-01-28 MED ORDER — IOPAMIDOL (ISOVUE-300) INJECTION 61%: 75.0000 mL | Freq: Once | INTRAVENOUS | Status: DC | PRN

## 2019-01-28 MED ORDER — FLEET ENEMA 7-19 GM/118ML RE ENEM: 1.0000 | ENEMA | Freq: Once | RECTAL | Status: DC

## 2019-01-28 MED ORDER — SODIUM CHLORIDE 0.9 % IV SOLN
INTRAVENOUS | Status: DC
  Administered 2019-01-28: 16:00:00 via INTRAVENOUS

## 2019-01-28 MED ORDER — SODIUM CHLORIDE 0.9 % IV BOLUS
500.0000 mL | Freq: Once | INTRAVENOUS | Status: AC
  Administered 2019-01-28: 500 mL via INTRAVENOUS

## 2019-01-28 NOTE — ED Notes (Signed)
CRITICAL VALUE ALERT  Critical Value:  Lactic Acid 2.0  Date & Time Notied:  01/28/19  Provider Notified: Denton Brick

## 2019-01-28 NOTE — ED Notes (Signed)
EDP at the bedside, assisted with rectal exam , per EMS, possible constipation, hard stools

## 2019-01-28 NOTE — ED Provider Notes (Signed)
[facility] EMERGENCY DEPARTMENT Provider Note   CSN: [id number] Arrival date & time: 01/28/19  1207    History   Chief Complaint Chief Complaint  Patient presents with   Emesis    HPI Debra Doyle is a 85 y.o. female.     Pt presents to the ED today with n/v and abd pain.  The pt said she's been vomiting for 2 days.  She c/o low back pain which has been there for several weeks.  It looks like she was here on 3/6 and was diagnosed with a subacute L2 compression fx.   She is a poor historian.  She said she has been constipated.  BP low at home, so she was started on IVFs.  SBP up to the 70s upon arrival.    Family said she's not been eating or drinking much.  She is not moving around.  She lives alone and family lives next door, so they have been checking on her several times per day.  Since 3/6, she has not gotten up much at all.  Family said they did give her something to make her have a bm yesterday and she had good results.     Past Medical History:  Diagnosis Date   Coronary atherosclerosis of native coronary artery    DES x 4 to LAD 1/12   Essential hypertension, benign    Fibrocystic breast disease    Hx of colonic polyps    Ischemic cardiomyopathy    Myocardial infarction [facility])    NSTEMI 1/12   ST elevation (STEMI) myocardial infarction involving left anterior descending coronary artery [facility])    January 2015 - LAD stent thrombosis off DAPT    Patient Active Problem List   Diagnosis Date Noted   Closed fracture of third metatarsal bone of right foot 11/26/17 01/21/2018   Closed nondisplaced fracture of proximal phalanx of right little finger 01/21/2018   Ischemic cardiomyopathy 01/09/2014   Mixed hyperlipidemia 12/29/2010   Essential hypertension, benign 12/29/2010   Coronary atherosclerosis of native coronary artery 12/29/2010    Past Surgical History:  Procedure Laterality Date   CATARACT EXTRACTION, BILATERAL     Bilateral   LEFT HEART  CATHETERIZATION WITH CORONARY ANGIOGRAM N/A 11/25/2013   Procedure: LEFT HEART CATHETERIZATION WITH CORONARY ANGIOGRAM;  Surgeon: Sinclair Grooms, MD;  Location: [facility] CATH LAB;  Service: Cardiovascular;  Laterality: N/A;   PERCUTANEOUS CORONARY STENT INTERVENTION (PCI-S)  11/25/2013   Procedure: PERCUTANEOUS CORONARY STENT INTERVENTION (PCI-S);  Surgeon: Sinclair Grooms, MD;  Location: [facility] CATH LAB;  Service: Cardiovascular;;     OB History   No obstetric history on file.      Home Medications    Prior to Admission medications   Medication Sig Start Date End Date Taking? Authorizing Provider  aspirin EC 81 MG tablet Take 1 tablet (81 mg total) by mouth daily. Patient taking differently: Take 81 mg by mouth every evening.  11/25/14  Yes Satira Sark, MD  atorvastatin (LIPITOR) 40 MG tablet TAKE 1 TABLET (40 MG TOTAL) BY MOUTH DAILY AT 6 PM. Patient taking differently: Take 40 mg by mouth every evening. TAKE 1 TABLET (40 MG TOTAL) BY MOUTH DAILY AT 6 PM. 10/01/18  Yes Satira Sark, MD  carvedilol (COREG) 6.25 MG tablet Take 1 tablet (6.25 mg total) by mouth 2 (two) times daily. 09/30/18  Yes Satira Sark, MD  HYDROcodone-acetaminophen (NORCO/VICODIN) 5-325 MG tablet 1 tab PO q8 hours prn pain Patient  taking differently: Take 1 tablet by mouth 2 (two) times daily. 1 tab PO q8 hours prn pain 01/17/19  Yes Francine Graven, DO  losartan (COZAAR) 50 MG tablet TAKE 1 TABLET BY MOUTH EVERY DAY Patient taking differently: Take 50 mg by mouth daily.  01/24/19  Yes Satira Sark, MD  methocarbamol (ROBAXIN) 500 MG tablet Take 1 tablet (500 mg total) by mouth 2 (two) times daily as needed for muscle spasms. 01/17/19  Yes Francine Graven, DO  nitroGLYCERIN (NITROSTAT) 0.4 MG SL tablet PLACE 1 TABLET UNDER THE TONGUE EVERY 5MINS X 3 DOSES AS NEEDED Patient taking differently: Place 0.4 mg under the tongue every 5 (five) minutes as needed for chest pain.  12/16/18  Yes Satira Sark, MD  spironolactone (ALDACTONE) 25 MG tablet TAKE 0.5 TABLETS (12.5 MG TOTAL) BY MOUTH DAILY. Patient taking differently: Take 12.5 mg by mouth every morning.  10/22/18  Yes Satira Sark, MD  Ascorbic Acid (VITAMIN C) 500 MG tablet Take 500 mg by mouth daily.      [provider]  Bilberry 100 MG CAPS Take 1 capsule by mouth daily.     [provider]  Cinnamon 500 MG capsule Take 500 mg by mouth daily.      [provider]  Magnesium 250 MG TABS Take 1 tablet by mouth daily.    [provider]  Multiple Vitamin (MULTIVITAMIN) tablet Take 1 tablet by mouth daily.      [provider]  Omega-3 Fatty Acids (FISH OIL) 1000 MG CAPS Take 1 capsule by mouth daily.     [provider]    Family History Family History  Problem Relation Age of Onset   Coronary artery disease Mother    Emphysema Father    Cancer Sister        1 sister/cancer type unknown   Stroke Brother        1 brother    Social History Social History   Tobacco Use   Smoking status: Never Smoker   Smokeless tobacco: Never Used  Substance Use Topics   Alcohol use: No   Drug use: No     Allergies   Patient has no known allergies.   Review of Systems Review of Systems  Gastrointestinal: Positive for abdominal pain, constipation, nausea and vomiting.  All other systems reviewed and are negative.    Physical Exam Updated Vital Signs BP (!) 81/48    Pulse 84    Temp (!) 97.5 F (36.4 C) (Oral)    Resp (!) 23    Wt 71.7 kg    SpO2 92%    BMI 28.90 kg/m   Physical Exam Vitals signs and nursing note reviewed.  Constitutional:      General: She is in acute distress.  HENT:     Head: Normocephalic and atraumatic.     Right Ear: External ear normal.     Left Ear: External ear normal.     Nose: Nose normal.     Mouth/Throat:     Mouth: Mucous membranes are dry.  Eyes:     Extraocular Movements: Extraocular movements intact.     Pupils:  Pupils are equal, round, and reactive to light.  Neck:     Musculoskeletal: Normal range of motion.  Cardiovascular:     Rate and Rhythm: Normal rate and regular rhythm.     Pulses: Normal pulses.     Heart sounds: Normal heart sounds.  Pulmonary:  Effort: Pulmonary effort is normal.     Breath sounds: Normal breath sounds.  Abdominal:     Tenderness: There is abdominal tenderness.  Genitourinary:    Rectum: Guaiac result positive.     Comments: Stool is brown Musculoskeletal: Normal range of motion.     Lumbar back: She exhibits tenderness.  Skin:    Capillary Refill: Capillary refill takes less than 2 seconds.  Neurological:     General: No focal deficit present.     Mental Status: She is alert and oriented to person, place, and time.  Psychiatric:        Mood and Affect: Mood normal.        Behavior: Behavior normal.      ED Treatments / Results  Labs (all labs ordered are listed, but only abnormal results are displayed) Labs Reviewed  COMPREHENSIVE METABOLIC PANEL - Abnormal; Notable for the following components:      Result Value   Sodium 131 (*)    CO2 17 (*)    Glucose, Bld 129 (*)    BUN 64 (*)    Creatinine, Ser 1.49 (*)    Calcium 7.6 (*)    Total Protein 5.6 (*)    Albumin 2.7 (*)    GFR calc non Af Amer 31 (*)    GFR calc Af Amer 36 (*)    All other components within normal limits  CBC WITH DIFFERENTIAL/PLATELET - Abnormal; Notable for the following components:   WBC 14.7 (*)    Neutro Abs 13.4 (*)    Lymphs Abs 0.5 (*)    All other components within normal limits  PROTIME-INR - Abnormal; Notable for the following components:   Prothrombin Time 16.7 (*)    INR 1.4 (*)    All other components within normal limits  POC OCCULT BLOOD, ED - Abnormal; Notable for the following components:   Fecal Occult Bld POSITIVE (*)    All other components within normal limits  URINE CULTURE  CULTURE, BLOOD (ROUTINE X 2)  CULTURE, BLOOD (ROUTINE X 2)  LACTIC  ACID, PLASMA  LACTIC ACID, PLASMA  URINALYSIS, ROUTINE W REFLEX MICROSCOPIC  TYPE AND SCREEN    EKG None  Radiology Ct Abdomen Pelvis W Contrast  Result Date: 01/28/2019 CLINICAL DATA:  Abdominal distension, constipation, nausea and vomiting. EXAM: CT ABDOMEN AND PELVIS WITH CONTRAST TECHNIQUE: Multidetector CT imaging of the abdomen and pelvis was performed using the standard protocol following bolus administration of intravenous contrast. CONTRAST:  76mL OMNIPAQUE IOHEXOL 300 MG/ML  SOLN COMPARISON:  None. FINDINGS: Lower chest: There is atelectasis at the right lung base with a trace pleural effusion. Mild atelectasis at the left lung base. Hepatobiliary: No focal liver abnormality is seen. No gallstones, gallbladder wall thickening, or biliary dilatation. Pancreas: Unremarkable. No pancreatic ductal dilatation or surrounding inflammatory changes. Spleen: Normal in size without focal abnormality. Adrenals/Urinary Tract: Adrenal glands are unremarkable. Kidneys are normal, without renal calculi, focal lesion, or hydronephrosis. Bladder is unremarkable. Stomach/Bowel: The stomach is very distended and fluid-filled without overt wall thickening, visible ulceration or evidence of perforation. There is some relative narrowing at the level of the distal antrum and pyloric region which may be secondary to peristalsis. A component of stenosis/stricture can not be excluded. Small bowel and colon are decompressed without evidence of obstruction, ileus or inflammatory process. No free air identified. No evidence of focal abscess. Vascular/Lymphatic: Atherosclerosis of the abdominal aorta and common iliac arteries without evidence of aneurysmal disease. No enlarged lymph nodes  identified in the abdomen or pelvis. Reproductive: Uterus and bilateral adnexa are unremarkable. Other: No abdominal wall hernia or abnormality. No abdominopelvic ascites. Musculoskeletal: No acute or significant osseous findings.  IMPRESSION: Significant gastric distension with fluid-filled stomach. There is no overt ulceration, mass or gastric wall thickening. Relative narrowing at the level of the distal antrum and pyloric region may be secondary to peristalsis or underlying stricture. Electronically Signed   By: Aletta Edouard M.D.   On: 01/28/2019 15:07   Dg Chest Port 1 View  Result Date: 01/28/2019 CLINICAL DATA:  Hypotension. EXAM: PORTABLE CHEST 1 VIEW COMPARISON:  11/25/2013 FINDINGS: Lungs are hypoinflated with mild bibasilar hazy opacification which may be due to atelectasis or overlying prominent soft tissue structures and much less likely infection. Mild cardiomegaly which is new. Coronary stent present over the left heart unchanged. Remainder the exam is unchanged. IMPRESSION: Mild hazy bibasilar opacification which may be due to atelectasis or overlying prominent soft tissues and less likely infection. Cardiomegaly which is new since the prior exam. Electronically Signed   By: Marin Olp M.D.   On: 01/28/2019 13:03    Procedures Procedures (including critical care time)  Medications Ordered in ED Medications  sodium chloride 0.9 % bolus 1,000 mL (0 mLs Intravenous Stopped 01/28/19 1425)    And  0.9 %  sodium chloride infusion (has no administration in time range)  sodium phosphate (FLEET) 7-19 GM/118ML enema 1 enema (has no administration in time range)  iopamidol (ISOVUE-300) 61 % injection 75 mL (has no administration in time range)  ondansetron (ZOFRAN) injection 4 mg (4 mg Intravenous Given 01/28/19 1245)  iohexol (OMNIPAQUE) 300 MG/ML solution 75 mL (75 mLs Intravenous Contrast Given 01/28/19 1408)  sodium chloride 0.9 % bolus 1,000 mL (1,000 mLs Intravenous New Bag/Given 01/28/19 1429)     Initial Impression / Assessment and Plan / ED Course  I have reviewed the triage vital signs and the nursing notes.  Pertinent labs & imaging results that were available during my care of the patient were  reviewed by me and considered in my medical decision making (see chart for details).    CRITICAL CARE Performed by: Isla Pence   Total critical care time: 30 minutes  Critical care time was exclusive of separately billable procedures and treating other patients.  Critical care was necessary to treat or prevent imminent or life-threatening deterioration.  Critical care was time spent personally by me on the following activities: development of treatment plan with patient and/or surrogate as well as nursing, discussions with consultants, evaluation of patient's response to treatment, examination of patient, obtaining history from patient or surrogate, ordering and performing treatments and interventions, ordering and review of laboratory studies, ordering and review of radiographic studies, pulse oximetry and re-evaluation of patient's condition.    BP has improved after IVFs, but is still SBP of 90s. Additional IVFs have been ordered.  Pt is guaiac +, but I think that is from the large BM she recently had.  Stool is brown and not grossly bloody or melanotic.  I added on blood and urine cultures, but hypotension is likely from volume depletion from n/v.   For the L2 compression fx, I ordered a TLSO brace for comfort.    Pt d/w Dr. Denton Brick (triad) for admission.  Final Clinical Impressions(s) / ED Diagnoses   Final diagnoses:  Hypotension  Closed compression fracture of L2 lumbar vertebra with delayed healing, subsequent encounter  Dehydration  AKI (acute kidney injury) ([place])  ED Discharge Orders    None       Isla Pence, MD 01/28/19 1530

## 2019-01-28 NOTE — ED Notes (Signed)
Pt back from CT, Son at the bedside, state pt belly was distended yesterday, he gave stool softners  And later pt had a very large BM.

## 2019-01-28 NOTE — H&P (Signed)
History and Physical    Debra Doyle SPQ:[id number] DOB: 1929/02/18 DOA: 01/28/2019  PCP: [name], MD   Patient coming from: Home  I have personally briefly reviewed patient's old medical records in [place]  Chief Complaint: Vomiting, back pain  HPI: Debra Doyle is a 85 y.o. female with medical history significant for hypertension, hyperlipidemia, coronary artery disease, who was brought to the ED with complaints of vomiting over the past 2 days, with barely any oral. intake.  And also reports abdominal distention mostly upper abdomen, with pain.  No loose stools.  No burning with urination.  Family reports a lot of belching over the past few days. No blood in stools, No black stools.  And has been constipated, she was given Dulcolax at home , after which patient had a good bowel movement. Patient lives alone but family lives next door, and check on her daily.   Patient has had persistent back pain, was seen in the ED 01/17/19 for same, MRI lumbar spine showed L2 compression fracture.  Since discharge from the ED patient has been sitting in a recliner, not ambulatory, due to pain. At baseline patient has mild/mild cognitive deficits, ambulates with a cane.  She denies fevers or chills, no sore throat, no nasal congestion, no difficulty breathing or cough.  No sick contacts, and no recent travel.  On EMS arrival blood pressure systolic was in the 33H.  ED Course: Blood pressure 75/52, improved to 90s with 2 L bolus.  Creatinine elevated 1.49, from baseline 0.5-0.6.  WBC 14.7.  X-ray showed mild hazy bibasilar opacification-atelectasis or overlying soft tissue, less likely infection.  Lactic acid 2.  Abdominal CT with contrast showed-significant gastric distention with fluid-filled stomach.  No overt ulceration mass or gastric wall thickening.  Relative narrowing at the level of the distal antrum and pyloric region may be secondary to peristalsis of underlying stricture. Hospitalist  called to admit for hypotension.  Review of Systems: As per HPI all other systems reviewed and negative.  Past Medical History:  Diagnosis Date   Coronary atherosclerosis of native coronary artery    DES x 4 to LAD 1/12   Essential hypertension, benign    Fibrocystic breast disease    Hx of colonic polyps    Ischemic cardiomyopathy    Myocardial infarction [facility])    NSTEMI 1/12   ST elevation (STEMI) myocardial infarction involving left anterior descending coronary artery [facility])    January 2015 - LAD stent thrombosis off DAPT    Past Surgical History:  Procedure Laterality Date   CATARACT EXTRACTION, BILATERAL     Bilateral   LEFT HEART CATHETERIZATION WITH CORONARY ANGIOGRAM N/A 11/25/2013   Procedure: LEFT HEART CATHETERIZATION WITH CORONARY ANGIOGRAM;  Surgeon: Sinclair Grooms, MD;  Location: [facility] CATH LAB;  Service: Cardiovascular;  Laterality: N/A;   PERCUTANEOUS CORONARY STENT INTERVENTION (PCI-S)  11/25/2013   Procedure: PERCUTANEOUS CORONARY STENT INTERVENTION (PCI-S);  Surgeon: Sinclair Grooms, MD;  Location: [facility] C[facility] LAB;  Service: Cardiovascular;;     reports that she has never smoked. She has never used smokeless tobacco. She reports that she does not drink alcohol or use drugs.  No Known Allergies  Family History  Problem Relation Age of Onset   Coronary artery disease Mother    Emphysema Father    Cancer Sister        1 sister/cancer type unknown   Stroke Brother        1 brother  Prior to Admission medications   Medication Sig Start Date End Date Taking? Authorizing Provider  aspirin EC 81 MG tablet Take 1 tablet (81 mg total) by mouth daily. Patient taking differently: Take 81 mg by mouth every evening.  11/25/14  Yes Satira Sark, MD  atorvastatin (LIPITOR) 40 MG tablet TAKE 1 TABLET (40 MG TOTAL) BY MOUTH DAILY AT 6 PM. Patient taking differently: Take 40 mg by mouth every evening. TAKE 1 TABLET (40 MG TOTAL) BY MOUTH DAILY AT 6 PM.  10/01/18  Yes Satira Sark, MD  carvedilol (COREG) 6.25 MG tablet Take 1 tablet (6.25 mg total) by mouth 2 (two) times daily. 09/30/18  Yes Satira Sark, MD  HYDROcodone-acetaminophen (NORCO/VICODIN) 5-325 MG tablet 1 tab PO q8 hours prn pain Patient taking differently: Take 1 tablet by mouth 2 (two) times daily. 1 tab PO q8 hours prn pain 01/17/19  Yes Francine Graven, DO  losartan (COZAAR) 50 MG tablet TAKE 1 TABLET BY MOUTH EVERY DAY Patient taking differently: Take 50 mg by mouth daily.  01/24/19  Yes Satira Sark, MD  methocarbamol (ROBAXIN) 500 MG tablet Take 1 tablet (500 mg total) by mouth 2 (two) times daily as needed for muscle spasms. 01/17/19  Yes Francine Graven, DO  nitroGLYCERIN (NITROSTAT) 0.4 MG SL tablet PLACE 1 TABLET UNDER THE TONGUE EVERY 5MINS X 3 DOSES AS NEEDED Patient taking differently: Place 0.4 mg under the tongue every 5 (five) minutes as needed for chest pain.  12/16/18  Yes Satira Sark, MD  spironolactone (ALDACTONE) 25 MG tablet TAKE 0.5 TABLETS (12.5 MG TOTAL) BY MOUTH DAILY. Patient taking differently: Take 12.5 mg by mouth every morning.  10/22/18  Yes Satira Sark, MD  Ascorbic Acid (VITAMIN C) 500 MG tablet Take 500 mg by mouth daily.      [provider]  Bilberry 100 MG CAPS Take 1 capsule by mouth daily.     [provider]  Cinnamon 500 MG capsule Take 500 mg by mouth daily.      [provider]  Magnesium 250 MG TABS Take 1 tablet by mouth daily.    [provider]  Multiple Vitamin (MULTIVITAMIN) tablet Take 1 tablet by mouth daily.      [provider]  Omega-3 Fatty Acids (FISH OIL) 1000 MG CAPS Take 1 capsule by mouth daily.     [provider]    Physical Exam: Vitals:   01/28/19 1300 01/28/19 1330 01/28/19 1400 01/28/19 1430  BP: (!) 91/52 (!) 88/62 98/64 (!) 81/48  Pulse: 73 76 84   Resp: (!) 24 (!) 23    Temp:      TempSrc:      SpO2: 93% 94% 92%   Weight:         Constitutional: Lethargic, but calm, comfortable Vitals:   01/28/19 1300 01/28/19 1330 01/28/19 1400 01/28/19 1430  BP: (!) 91/52 (!) 88/62 98/64 (!) 81/48  Pulse: 73 76 84   Resp: (!) 24 (!) 23    Temp:      TempSrc:      SpO2: 93% 94% 92%   Weight:       Eyes: PERRL, lids and conjunctivae normal ENMT: Mucous membranes are dry Posterior pharynx clear of any exudate or lesions. Neck: normal, supple, no masses, no thyromegaly Respiratory: clear to auscultation bilaterally, no wheezing, no crackles. Normal respiratory effort. No accessory muscle use.  Cardiovascular: Regular rate and rhythm, no murmurs / rubs / gallops.  No extremity edema. 2+ pedal pulses.  Abdomen: distension of epigastric area, with tenderness. no masses palpated. No hepatosplenomegaly.  Musculoskeletal: no clubbing / cyanosis. No joint deformity upper and lower extremities. Good ROM, no contractures. Normal muscle tone.  Skin: no rashes, lesions, ulcers. No induration Neurologic: CN 2-12 grossly intact. Strength 5/5 in all 4.  Psychiatric: Normal judgment and insight. Alert and oriented x 3. Normal mood.   Labs on Admission: I have personally reviewed following labs and imaging studies  CBC: Recent Labs  Lab 01/28/19 1237  WBC 14.7*  NEUTROABS 13.4*  HGB 13.5  HCT 39.0  MCV 95.8  PLT 607   Basic Metabolic Panel: Recent Labs  Lab 01/28/19 1237  NA 131*  K 4.2  CL 103  CO2 17*  GLUCOSE 129*  BUN 64*  CREATININE 1.49*  CALCIUM 7.6*   Liver Function Tests: Recent Labs  Lab 01/28/19 1237  AST 30  ALT 19  ALKPHOS 76  BILITOT 0.6  PROT 5.6*  ALBUMIN 2.7*   Coagulation Profile: Recent Labs  Lab 01/28/19 1237  INR 1.4*   Urine analysis:    Component Value Date/Time   COLORURINE YELLOW 01/17/2019 2148   APPEARANCEUR CLEAR 01/17/2019 2148   LABSPEC 1.006 01/17/2019 2148   PHURINE 6.0 01/17/2019 2148   GLUCOSEU NEGATIVE 01/17/2019 2148   HGBUR SMALL (A) 01/17/2019 2148    BILIRUBINUR NEGATIVE 01/17/2019 2148   [place] NEGATIVE 01/17/2019 2148   PROTEINUR NEGATIVE 01/17/2019 2148   UROBILINOGEN 0.2 12/10/2010 1555   NITRITE NEGATIVE 01/17/2019 2148   LEUKOCYTESUR NEGATIVE 01/17/2019 2148    Radiological Exams on Admission: Ct Abdomen Pelvis W Contrast  Result Date: 01/28/2019 CLINICAL DATA:  Abdominal distension, constipation, nausea and vomiting. EXAM: CT ABDOMEN AND PELVIS WITH CONTRAST TECHNIQUE: Multidetector CT imaging of the abdomen and pelvis was performed using the standard protocol following bolus administration of intravenous contrast. CONTRAST:  60mL OMNIPAQUE IOHEXOL 300 MG/ML  SOLN COMPARISON:  None. FINDINGS: Lower chest: There is atelectasis at the right lung base with a trace pleural effusion. Mild atelectasis at the left lung base. Hepatobiliary: No focal liver abnormality is seen. No gallstones, gallbladder wall thickening, or biliary dilatation. Pancreas: Unremarkable. No pancreatic ductal dilatation or surrounding inflammatory changes. Spleen: Normal in size without focal abnormality. Adrenals/Urinary Tract: Adrenal glands are unremarkable. Kidneys are normal, without renal calculi, focal lesion, or hydronephrosis. Bladder is unremarkable. Stomach/Bowel: The stomach is very distended and fluid-filled without overt wall thickening, visible ulceration or evidence of perforation. There is some relative narrowing at the level of the distal antrum and pyloric region which may be secondary to peristalsis. A component of stenosis/stricture can not be excluded. Small bowel and colon are decompressed without evidence of obstruction, ileus or inflammatory process. No free air identified. No evidence of focal abscess. Vascular/Lymphatic: Atherosclerosis of the abdominal aorta and common iliac arteries without evidence of aneurysmal disease. No enlarged lymph nodes identified in the abdomen or pelvis. Reproductive: Uterus and bilateral adnexa are unremarkable.  Other: No abdominal wall hernia or abnormality. No abdominopelvic ascites. Musculoskeletal: No acute or significant osseous findings. IMPRESSION: Significant gastric distension with fluid-filled stomach. There is no overt ulceration, mass or gastric wall thickening. Relative narrowing at the level of the distal antrum and pyloric region may be secondary to peristalsis or underlying stricture. Electronically Signed   By: Aletta Edouard M.D.   On: 01/28/2019 15:07   Dg Chest Port 1 View  Result Date: 01/28/2019 CLINICAL DATA:  Hypotension. EXAM: PORTABLE CHEST 1  VIEW COMPARISON:  11/25/2013 FINDINGS: Lungs are hypoinflated with mild bibasilar hazy opacification which may be due to atelectasis or overlying prominent soft tissue structures and much less likely infection. Mild cardiomegaly which is new. Coronary stent present over the left heart unchanged. Remainder the exam is unchanged. IMPRESSION: Mild hazy bibasilar opacification which may be due to atelectasis or overlying prominent soft tissues and less likely infection. Cardiomegaly which is new since the prior exam. Electronically Signed   By: Marin Olp M.D.   On: 01/28/2019 13:03    EKG:  None  Assessment/Plan Active Problems:   AKI (acute kidney injury) ([place])    Acute kidney injury-creatinine 1.49, baseline 0.5-0.6.  With hypotension.  Likely from poor p.o. intake, vomiting with distended fluid- filled stomach.  Also ARBs likely contributing. -Hydrate normal saline 100 cc/h x 1 day - Repeat 582ml bolus, for hypotension, totaling 2.5L bolus. - BMP a.m -Hold losartan, Spironolactone  Gastric outlet obstruction- epigastric area distention, with vomiting, CT shows gastric distention, relative narrowing at the level of the distal antrum and pyloric region- Peristalsis or underlying stricture. -Will place NG tube for comfort - NPO -GI consult, if patient would benefit from endoscopy.  Hypertension-currently hypotensive likely from poor  p.o. intake.  At this time no symptoms to suggest infectious etiology for hypotension.  Chest x-ray with bibasilar opacity - atelectasis vs soft tissue.  UA pending - F/u  blood cultures -Hydrate -Hold home Coreg, losartan, spironolactone.  Back pain-recent MRI 01/17/2019 shows L2 compression fracture.  Family report patient will be going to a rehab after this admission. -Follow-up as outpatient -Continue TLSO brace - PT eval.   Leukocytosis- WBC 14.7.  At this time no infectious etiology identified. -Follow-up UA - CBC a.m.  CAD- Hx of NSTEMI 2012,  STEMI- 2015.  Patient denies chest pain, or difficulty breathing. -Resume home aspirin Lipitor Coreg losartan spironolactone when p.o. intake resumed, pending stable blood pressure.   DVT prophylaxis: Scds Code Status: Full Family Communication: Son- david and daughter in law at bedside. Son Shanon Brow is primary Media planner.  Disposition Plan: 1-2 days Consults called: GI Admission status: Obs, tele   Bethena Roys MD [facility]  01/28/2019, 6:19 PM

## 2019-01-28 NOTE — ED Triage Notes (Signed)
From home Vomiting last 2 days, Home health at the home, L2 fracture not new,  Low BP at home, EMS started IV 400 fluid Bolus. CBG 191

## 2019-01-28 NOTE — ED Triage Notes (Signed)
Pt complains of constipation

## 2019-01-28 NOTE — Progress Notes (Signed)
Notified k kirby via [employer] system that pt is refusing NGT placement. Explanation given to pt and family as to why it's ordered. Will continue to monitor.

## 2019-01-29 ENCOUNTER — Telehealth (INDEPENDENT_AMBULATORY_CARE_PROVIDER_SITE_OTHER): Payer: Self-pay | Admitting: Internal Medicine

## 2019-01-29 ENCOUNTER — Observation Stay (HOSPITAL_COMMUNITY): Payer: PPO

## 2019-01-29 ENCOUNTER — Encounter (HOSPITAL_COMMUNITY)
Admission: EM | Disposition: A | Discharge status: Skilled Nursing Facility | Payer: Self-pay | Source: Home / Self Care | Attending: Internal Medicine

## 2019-01-29 DIAGNOSIS — Z8249 Family history of ischemic heart disease and other diseases of the circulatory system: Secondary | ICD-10-CM | POA: Diagnosis not present

## 2019-01-29 DIAGNOSIS — E860 Dehydration: Secondary | ICD-10-CM | POA: Diagnosis present

## 2019-01-29 DIAGNOSIS — N179 Acute kidney failure, unspecified: Secondary | ICD-10-CM | POA: Diagnosis present

## 2019-01-29 DIAGNOSIS — I252 Old myocardial infarction: Secondary | ICD-10-CM | POA: Diagnosis not present

## 2019-01-29 DIAGNOSIS — S32020G Wedge compression fracture of second lumbar vertebra, subsequent encounter for fracture with delayed healing: Secondary | ICD-10-CM

## 2019-01-29 DIAGNOSIS — Z4659 Encounter for fitting and adjustment of other gastrointestinal appliance and device: Secondary | ICD-10-CM | POA: Diagnosis not present

## 2019-01-29 DIAGNOSIS — K5900 Constipation, unspecified: Secondary | ICD-10-CM | POA: Diagnosis present

## 2019-01-29 DIAGNOSIS — K210 Gastro-esophageal reflux disease with esophagitis: Secondary | ICD-10-CM | POA: Diagnosis present

## 2019-01-29 DIAGNOSIS — Z7982 Long term (current) use of aspirin: Secondary | ICD-10-CM | POA: Diagnosis not present

## 2019-01-29 DIAGNOSIS — R918 Other nonspecific abnormal finding of lung field: Secondary | ICD-10-CM | POA: Diagnosis not present

## 2019-01-29 DIAGNOSIS — D72829 Elevated white blood cell count, unspecified: Secondary | ICD-10-CM | POA: Diagnosis present

## 2019-01-29 DIAGNOSIS — Z931 Gastrostomy status: Secondary | ICD-10-CM | POA: Diagnosis not present

## 2019-01-29 DIAGNOSIS — I119 Hypertensive heart disease without heart failure: Secondary | ICD-10-CM | POA: Diagnosis present

## 2019-01-29 DIAGNOSIS — R4189 Other symptoms and signs involving cognitive functions and awareness: Secondary | ICD-10-CM | POA: Diagnosis present

## 2019-01-29 DIAGNOSIS — I10 Essential (primary) hypertension: Secondary | ICD-10-CM | POA: Diagnosis not present

## 2019-01-29 DIAGNOSIS — E782 Mixed hyperlipidemia: Secondary | ICD-10-CM | POA: Diagnosis present

## 2019-01-29 DIAGNOSIS — Z79899 Other long term (current) drug therapy: Secondary | ICD-10-CM | POA: Diagnosis not present

## 2019-01-29 DIAGNOSIS — K311 Adult hypertrophic pyloric stenosis: Secondary | ICD-10-CM | POA: Diagnosis present

## 2019-01-29 DIAGNOSIS — I9589 Other hypotension: Secondary | ICD-10-CM

## 2019-01-29 DIAGNOSIS — I2510 Atherosclerotic heart disease of native coronary artery without angina pectoris: Secondary | ICD-10-CM | POA: Diagnosis present

## 2019-01-29 DIAGNOSIS — K269 Duodenal ulcer, unspecified as acute or chronic, without hemorrhage or perforation: Secondary | ICD-10-CM | POA: Diagnosis present

## 2019-01-29 DIAGNOSIS — K209 Esophagitis, unspecified: Secondary | ICD-10-CM | POA: Diagnosis not present

## 2019-01-29 DIAGNOSIS — R112 Nausea with vomiting, unspecified: Secondary | ICD-10-CM | POA: Diagnosis not present

## 2019-01-29 DIAGNOSIS — Z8719 Personal history of other diseases of the digestive system: Secondary | ICD-10-CM | POA: Diagnosis not present

## 2019-01-29 DIAGNOSIS — I959 Hypotension, unspecified: Secondary | ICD-10-CM | POA: Diagnosis present

## 2019-01-29 DIAGNOSIS — I255 Ischemic cardiomyopathy: Secondary | ICD-10-CM | POA: Diagnosis present

## 2019-01-29 DIAGNOSIS — N6019 Diffuse cystic mastopathy of unspecified breast: Secondary | ICD-10-CM | POA: Diagnosis present

## 2019-01-29 DIAGNOSIS — M4856XD Collapsed vertebra, not elsewhere classified, lumbar region, subsequent encounter for fracture with routine healing: Secondary | ICD-10-CM | POA: Diagnosis present

## 2019-01-29 DIAGNOSIS — Z955 Presence of coronary angioplasty implant and graft: Secondary | ICD-10-CM | POA: Diagnosis not present

## 2019-01-29 DIAGNOSIS — Z66 Do not resuscitate: Secondary | ICD-10-CM | POA: Diagnosis present

## 2019-01-29 HISTORY — PX: ESOPHAGOGASTRODUODENOSCOPY: SHX5428

## 2019-01-29 LAB — URINALYSIS, ROUTINE W REFLEX MICROSCOPIC
Bilirubin Urine: NEGATIVE
GLUCOSE, UA: NEGATIVE mg/dL
HGB URINE DIPSTICK: NEGATIVE
Ketones, ur: 5 mg/dL — AB
Leukocytes,Ua: NEGATIVE
Nitrite: NEGATIVE
Protein, ur: NEGATIVE mg/dL
Specific Gravity, Urine: 1.038 — ABNORMAL HIGH (ref 1.005–1.030)
pH: 5 (ref 5.0–8.0)

## 2019-01-29 LAB — BASIC METABOLIC PANEL
Anion gap: 5 (ref 5–15)
BUN: 45 mg/dL — ABNORMAL HIGH (ref 8–23)
CO2: 18 mmol/L — ABNORMAL LOW (ref 22–32)
Calcium: 7.6 mg/dL — ABNORMAL LOW (ref 8.9–10.3)
Chloride: 112 mmol/L — ABNORMAL HIGH (ref 98–111)
Creatinine, Ser: 0.71 mg/dL (ref 0.44–1.00)
GFR calc Af Amer: >60 mL/min (ref >60)
GFR calc non Af Amer: >60 mL/min (ref >60)
GLUCOSE: 89 mg/dL (ref 70–99)
Potassium: 3.8 mmol/L (ref 3.5–5.1)
Sodium: 135 mmol/L (ref 135–145)

## 2019-01-29 SURGERY — EGD (ESOPHAGOGASTRODUODENOSCOPY)
Anesthesia: Moderate Sedation

## 2019-01-29 MED ORDER — STERILE WATER FOR IRRIGATION IR SOLN
Status: DC | PRN
  Administered 2019-01-29: 1.25 mL

## 2019-01-29 MED ORDER — MIDAZOLAM HCL 5 MG/5ML IJ SOLN
INTRAMUSCULAR | Status: DC | PRN
  Administered 2019-01-29 (×3): 1 mg via INTRAVENOUS

## 2019-01-29 MED ORDER — LIDOCAINE VISCOUS HCL 2 % MT SOLN
OROMUCOSAL | Status: DC | PRN
  Administered 2019-01-29: 1 via OROMUCOSAL

## 2019-01-29 MED ORDER — PHENOL 1.4 % MT LIQD: 1.0000 | OROMUCOSAL | Status: DC | PRN

## 2019-01-29 MED ORDER — MIDAZOLAM HCL 5 MG/5ML IJ SOLN
INTRAMUSCULAR | Status: AC
  Filled 2019-01-29: qty 5

## 2019-01-29 MED ORDER — PANTOPRAZOLE SODIUM 40 MG IV SOLR
40.0000 mg | Freq: Once | INTRAVENOUS | Status: AC
  Administered 2019-01-29: 40 mg via INTRAVENOUS
  Filled 2019-01-29 (×2): qty 40

## 2019-01-29 MED ORDER — METOCLOPRAMIDE HCL 5 MG/ML IJ SOLN
5.0000 mg | Freq: Four times a day (QID) | INTRAMUSCULAR | Status: AC
  Administered 2019-01-29 – 2019-01-31 (×8): 5 mg via INTRAVENOUS
  Filled 2019-01-29 (×8): qty 2

## 2019-01-29 MED ORDER — LIDOCAINE VISCOUS HCL 2 % MT SOLN
OROMUCOSAL | Status: AC
  Filled 2019-01-29: qty 15

## 2019-01-29 MED ORDER — MEPERIDINE HCL 50 MG/ML IJ SOLN
INTRAMUSCULAR | Status: DC | PRN
  Administered 2019-01-29: 20 mg via INTRAVENOUS
  Administered 2019-01-29: 10 mg via INTRAVENOUS

## 2019-01-29 MED ORDER — MEPERIDINE HCL 50 MG/ML IJ SOLN
INTRAMUSCULAR | Status: AC
  Filled 2019-01-29: qty 1

## 2019-01-29 MED ORDER — PANTOPRAZOLE SODIUM 40 MG IV SOLR
40.0000 mg | Freq: Two times a day (BID) | INTRAVENOUS | Status: DC
  Administered 2019-01-29 – 2019-01-31 (×4): 40 mg via INTRAVENOUS
  Filled 2019-01-29 (×5): qty 40

## 2019-01-29 MED ORDER — LIDOCAINE 5 % EX PTCH
1.0000 | MEDICATED_PATCH | CUTANEOUS | Status: DC
  Administered 2019-01-29: 1 via TRANSDERMAL
  Filled 2019-01-29 (×2): qty 1

## 2019-01-29 NOTE — Telephone Encounter (Signed)
err

## 2019-01-29 NOTE — Progress Notes (Signed)
PT Cancellation Note  Patient Details Name: Debra Doyle MRN: [id number] DOB: 01-21-1929   Cancelled Treatment:    Reason Eval/Treat Not Completed: Patient at procedure or test/unavailable.  Patient being prepped for a procedure and still waiting for TLSO.  Will check back tomorrow.   4:14 PM, 01/29/19 Lonell Grandchild, MPT Physical Therapist with [facility] [phone] office [phone] mobile phone

## 2019-01-29 NOTE — Op Note (Signed)
[facility] Patient Name: Debra Doyle Procedure Date: 01/29/2019 4:00 PM MRN: [id number] Date of Birth: 06/19/1929 Attending MD: Hildred Laser , MD CSN: [id number] Age: 85 Admit Type: Outpatient Procedure:                Upper GI endoscopy Indications:              Nausea with vomiting Providers:                Hildred Laser, MD, Lurline Del, RN, Aram Candela Referring MD:              Medicines:                Lidocaine spray, Meperidine 30 mg IV, Midazolam 3                            mg IV Complications:            No immediate complications. Estimated Blood Loss:     Estimated blood loss: none. Procedure:                Pre-Anesthesia Assessment:                           - Prior to the procedure, a History and Physical                            was performed, and patient medications and                            allergies were reviewed. The patient's tolerance of                            previous anesthesia was also reviewed. The risks                            and benefits of the procedure and the sedation                            options and risks were discussed with the patient.                            All questions were answered, and informed consent                            was obtained. Prior Anticoagulants: The patient                            last took aspirin 1 day prior to the procedure. ASA                            Grade Assessment: III - A patient with severe                            systemic disease. After reviewing the risks and  benefits, the patient was deemed in satisfactory                            condition to undergo the procedure.                           After obtaining informed consent, the endoscope was                            passed under direct vision. Throughout the                            procedure, the patient's blood pressure, pulse, and                            oxygen saturations were  monitored continuously. The                            [id number]-[id number] ([id number]) scope was introduced through the                            mouth, and advanced to the second part of duodenum.                            The upper GI endoscopy was accomplished without                            difficulty. The patient tolerated the procedure                            well. Scope In: 4:38:33 PM Scope Out: 4:44:33 PM Total Procedure Duration: 0 hours 6 minutes 0 seconds  Findings:      The proximal esophagus was normal.      LA Grade D (one or more mucosal breaks involving at least 75% of       esophageal circumference) esophagitis with no bleeding was found 25 to       38 cm from the incisors.      The Z-line was regular and was found 38 cm from the incisors.      Localized mildly erythematous mucosa NG tube trauma was found in the       gastric body and on the greater curvature of the stomach.      Bilious fluid was found in the gastric body.      The exam of the stomach was otherwise normal.      Two non-bleeding superficial duodenal ulcers with no stigmata of       bleeding were found in the duodenal bulb. The largest lesion was small       and large bulbar ulcers. mm in largest dimension.      The second portion of the duodenum was normal except there was small       amount of food debris. Impression:               - Normal proximal esophagus.                           -  LA Grade D reflux esophagitis.                           - Z-line regular, 38 cm from the incisors.                           - Erythematous mucosa in the gastric body and                            greater curvature. NG tube trauma.                           - Bilious gastric fluid.                           - Multiple non-bleeding duodenal ulcers with no                            stigmata of bleeding.                           - Normal second portion of the duodenum.                           - No specimens  collected. Moderate Sedation:      Moderate (conscious) sedation was administered by the endoscopy nurse       and supervised by the endoscopist. The following parameters were       monitored: oxygen saturation, heart rate, blood pressure, CO2       capnography and response to care. Total physician intraservice time was       12 minutes. Recommendation:           - Patient has a contact number available for                            emergencies. The signs and symptoms of potential                            delayed complications were discussed with the                            patient. Return to normal activities tomorrow.                            Written discharge instructions were provided to the                            patient.                           - Clear liquid diet today.                           - Continue present medications.                           -  Use Protonix (pantoprazole) 40 mg IV BID today.                           - Use metoclopramide (Reglan) 5 mg IV QID for 2                            days.                           - H. Pylori serology. Procedure Code(s):        --- Professional ---                           [phone], Esophagogastroduodenoscopy, flexible,                            transoral; diagnostic, including collection of                            specimen(s) by brushing or washing, when performed                            (separate procedure)                           G0500, Moderate sedation services provided by the                            same physician or other qualified health care                            professional performing a gastrointestinal                            endoscopic service that sedation supports,                            requiring the presence of an independent trained                            observer to assist in the monitoring of the                            patient's level of consciousness and  physiological                            status; initial 15 minutes of intra-service time;                            patient age 75 years or older (additional time may                            be reported with [phone], as appropriate) Diagnosis Code(s):        --- Professional ---  K21.0, Gastro-esophageal reflux disease with                            esophagitis                           K31.89, Other diseases of stomach and duodenum                           K26.9, Duodenal ulcer, unspecified as acute or                            chronic, without hemorrhage or perforation                           R11.2, Nausea with vomiting, unspecified CPT copyright 2018 American Medical Association. All rights reserved. The codes documented in this report are preliminary and upon coder review may  be revised to meet current compliance requirements. Hildred Laser, MD Hildred Laser, MD 01/29/2019 5:02:28 PM This report has been signed electronically. Number of Addenda: 0

## 2019-01-29 NOTE — Progress Notes (Addendum)
Brief EGD note.  Normal mucosa of the proximal esophagus. Extensive ulceration from 25 cm down to 38 cm/GE junction(grade D esophagitis). Focal erythema and specks of coffee-ground along greater curvature body is felt to be NG tube trauma. 2 bulbar ulcers.  One large along the posterior wall and smaller ulcer located towards antral medial wall.

## 2019-01-29 NOTE — Progress Notes (Addendum)
Patient bladder scanned and reading 726 ml's. Patient distended.  In and out catheterization performed. 760 ml's of yellow urine output. Denies discomfort at this time. Will continue to monitor.

## 2019-01-29 NOTE — Progress Notes (Signed)
Yellow DNR form found in room. Code status ordered as full code. Patient states "Im not sure. I'll have to talk to my son about it" [facility] notified. No orders to change code status to DNR.

## 2019-01-29 NOTE — Care Management Obs Status (Signed)
[place] NOTIFICATION   Patient Details  Name: Debra Doyle MRN: [id number] Date of Birth: 02-12-1929   Medicare Observation Status Notification Given:  Yes    Ihor Gully, LCSW 01/29/2019, 3:16 PM

## 2019-01-29 NOTE — Progress Notes (Signed)
PROGRESS NOTE    Debra Doyle  GUY:[id number] DOB: April 16, 1929 DOA: 01/28/2019 PCP: [name], MD     Brief Narrative:  85 y.o. female with medical history significant for hypertension, hyperlipidemia, coronary artery disease, who was brought to the ED with complaints of vomiting over the past 2 days, with barely any oral. intake.  And also reports abdominal distention mostly upper abdomen, with pain.  No loose stools.  No burning with urination.  Family reports a lot of belching over the past few days. No blood in stools, No black stools.  And has been constipated, she was given Dulcolax at home , after which patient had a good bowel movement. Patient lives alone but family lives next door, and check on her daily.   Patient has had persistent back pain, was seen in the ED 01/17/19 for same, MRI lumbar spine showed L2 compression fracture.  Since discharge from the ED patient has been sitting in a recliner, not ambulatory, due to pain. At baseline patient has mild/mild cognitive deficits, ambulates with a cane.  She denies fevers or chills, no sore throat, no nasal congestion, no difficulty breathing or cough.  No sick contacts, and no recent travel.   Assessment & Plan: 1-gastric outlet obstruction -NG tube in place -Continue n.p.o. -Follow GI service recommendation for need of endoscopic procedures.  2-acute kidney injury -In the setting of hypotension and dehydration -Continue holding nephrotoxic agents -Continue fluid resuscitation -Follow renal function trend.  3-essential hypertension -Currently experiencing low blood pressure -Will continue holding antihypertensive agents. -Continue IV fluids.  4-lower back pain with recent evaluation demonstrating L2 compression fracture -Continue TLSO brace -As needed pain medication -LidoDerm patch has been ordered  5-history of coronary artery disease -Patient with NSTEMI in 2012 and a STEMI in 2015. -No chest pain, no shortness of  breath -Will resume aspirin, Lipitor, beta-blocker, losartan and spironolactone once able to tolerate by mouth again. -Discontinue telemetry.   DVT prophylaxis: SCDs Code Status: Full code Family Communication: Daughter and sons at bedside Disposition Plan: Remains inpatient.  Continue IV fluids (adjusting dose), follow GI recommendations regarding need for endoscopic evaluation.  NG tube has been placed.  Continue n.p.o. status.  Apply lidoderm patch to lower back to assist with pain.  Consultants:   GI service  Procedures:   See below for x-ray reports  Antimicrobials:  Anti-infectives (From admission, onward)   None      Subjective: Afebrile, no chest pain, no vomiting.  Complaining of lower back pain and intermittent nausea.  Objective: Vitals:   01/29/19 1650 01/29/19 1655 01/29/19 1700 01/29/19 1727  BP: (!) 101/50 (!) 88/52  125/69  Pulse: 79 75 78   Resp: 17 20 (!) 23 18  Temp:    97.6 F (36.4 C)  TempSrc:    Oral  SpO2: 100% 100% 99%   Weight:      Height:        Intake/Output Summary (Last 24 hours) at 01/29/2019 1806 Last data filed at 01/29/2019 1300 Gross per 24 hour  Intake 1306.55 ml  Output 1000 ml  Net 306.55 ml   Filed Weights   01/28/19 1212 01/28/19 1853  Weight: 71.7 kg 74 kg    Examination: General exam: Alert, awake, oriented x 2; NG tube in place, complaining of lower back pain, no chest pain, no shortness of breath, no nausea, no vomiting. Respiratory system: Clear to auscultation. Respiratory effort normal. Cardiovascular system:RRR. No murmurs, rubs, gallops. Gastrointestinal system: Abdomen is mildly distended,  soft and without guarding.  Positive bowel sounds.   Central nervous system: Alert and oriented. No focal neurological deficits. Extremities: No cyanosis or clubbing. Skin: No rashes, lesions or ulcers Psychiatry: Judgement and insight appear impaired in the setting of underlying dementia.  Mood and affect appropriate.    Data Reviewed: I have personally reviewed following labs and imaging studies  CBC: Recent Labs  Lab 01/28/19 1237  WBC 14.7*  NEUTROABS 13.4*  HGB 13.5  HCT 39.0  MCV 95.8  PLT 601   Basic Metabolic Panel: Recent Labs  Lab 01/28/19 1237 01/29/19 0439  NA 131* 135  K 4.2 3.8  CL 103 112*  CO2 17* 18*  GLUCOSE 129* 89  BUN 64* 45*  CREATININE 1.49* 0.71  CALCIUM 7.6* 7.6*   GFR: Estimated Creatinine Clearance: 51 mL/min (by C-G formula based on SCr of 0.71 mg/dL).   Liver Function Tests: Recent Labs  Lab 01/28/19 1237  AST 30  ALT 19  ALKPHOS 76  BILITOT 0.6  PROT 5.6*  ALBUMIN 2.7*   Coagulation Profile: Recent Labs  Lab 01/28/19 1237  INR 1.4*   Urine analysis:    Component Value Date/Time   COLORURINE YELLOW 01/28/2019 1522   APPEARANCEUR CLEAR 01/28/2019 1522   LABSPEC 1.038 (H) 01/28/2019 1522   PHURINE 5.0 01/28/2019 1522   GLUCOSEU NEGATIVE 01/28/2019 1522   HGBUR NEGATIVE 01/28/2019 1522   BILIRUBINUR NEGATIVE 01/28/2019 1522   KETONESUR 5 (A) 01/28/2019 1522   PROTEINUR NEGATIVE 01/28/2019 1522   UROBILINOGEN 0.2 12/10/2010 1555   NITRITE NEGATIVE 01/28/2019 1522   LEUKOCYTESUR NEGATIVE 01/28/2019 1522    Recent Results (from the past 240 hour(s))  Culture, blood (routine x 2)     Status: None (Preliminary result)   Collection Time: 01/28/19  3:33 PM  Result Value Ref Range Status   Specimen Description BLOOD RIGHT HAND  Final   Special Requests   Final    BOTTLES DRAWN AEROBIC ONLY Blood Culture results may not be optimal due to an inadequate volume of blood received in culture bottles   Culture   Final    NO GROWTH < 24 HOURS Performed at [facility], [address] [place], [place] [address]    Report Status PENDING  Incomplete  Culture, blood (routine x 2)     Status: None (Preliminary result)   Collection Time: 01/28/19  3:50 PM  Result Value Ref Range Status   Specimen Description LEFT ANTECUBITAL  Final   Special Requests    Final    BOTTLES DRAWN AEROBIC AND ANAEROBIC Blood Culture adequate volume   Culture   Final    NO GROWTH < 24 HOURS Performed at [facility], [address] [place], [place] [address]    Report Status PENDING  Incomplete     Radiology Studies: Ct Abdomen Pelvis W Contrast  Result Date: 01/28/2019 CLINICAL DATA:  Abdominal distension, constipation, nausea and vomiting. EXAM: CT ABDOMEN AND PELVIS WITH CONTRAST TECHNIQUE: Multidetector CT imaging of the abdomen and pelvis was performed using the standard protocol following bolus administration of intravenous contrast. CONTRAST:  48mL OMNIPAQUE IOHEXOL 300 MG/ML  SOLN COMPARISON:  None. FINDINGS: Lower chest: There is atelectasis at the right lung base with a trace pleural effusion. Mild atelectasis at the left lung base. Hepatobiliary: No focal liver abnormality is seen. No gallstones, gallbladder wall thickening, or biliary dilatation. Pancreas: Unremarkable. No pancreatic ductal dilatation or surrounding inflammatory changes. Spleen: Normal in size without focal abnormality. Adrenals/Urinary Tract: Adrenal glands  are unremarkable. Kidneys are normal, without renal calculi, focal lesion, or hydronephrosis. Bladder is unremarkable. Stomach/Bowel: The stomach is very distended and fluid-filled without overt wall thickening, visible ulceration or evidence of perforation. There is some relative narrowing at the level of the distal antrum and pyloric region which may be secondary to peristalsis. A component of stenosis/stricture can not be excluded. Small bowel and colon are decompressed without evidence of obstruction, ileus or inflammatory process. No free air identified. No evidence of focal abscess. Vascular/Lymphatic: Atherosclerosis of the abdominal aorta and common iliac arteries without evidence of aneurysmal disease. No enlarged lymph nodes identified in the abdomen or pelvis. Reproductive: Uterus and bilateral adnexa are unremarkable. Other: No  abdominal wall hernia or abnormality. No abdominopelvic ascites. Musculoskeletal: No acute or significant osseous findings. IMPRESSION: Significant gastric distension with fluid-filled stomach. There is no overt ulceration, mass or gastric wall thickening. Relative narrowing at the level of the distal antrum and pyloric region may be secondary to peristalsis or underlying stricture. Electronically Signed   By: Aletta Edouard M.D.   On: 01/28/2019 15:07   Dg Chest Port 1 View  Result Date: 01/29/2019 CLINICAL DATA:  NG placement EXAM: PORTABLE CHEST 1 VIEW COMPARISON:  01/28/2019 FINDINGS: NG tube in the stomach. Cardiac enlargement without heart failure. Mild bibasilar airspace disease unchanged. Possible pneumonia or atelectasis. IMPRESSION: NG in the stomach Mild bibasilar airspace disease unchanged. Electronically Signed   By: Franchot Gallo M.D.   On: 01/29/2019 13:36   Dg Chest Port 1 View  Result Date: 01/28/2019 CLINICAL DATA:  Hypotension. EXAM: PORTABLE CHEST 1 VIEW COMPARISON:  11/25/2013 FINDINGS: Lungs are hypoinflated with mild bibasilar hazy opacification which may be due to atelectasis or overlying prominent soft tissue structures and much less likely infection. Mild cardiomegaly which is new. Coronary stent present over the left heart unchanged. Remainder the exam is unchanged. IMPRESSION: Mild hazy bibasilar opacification which may be due to atelectasis or overlying prominent soft tissues and less likely infection. Cardiomegaly which is new since the prior exam. Electronically Signed   By: Marin Olp M.D.   On: 01/28/2019 13:03    Scheduled Meds: . lidocaine  1 patch Transdermal Q24H  . lidocaine      . meperidine      . metoCLOPramide (REGLAN) injection  5 mg Intravenous Q6H  . midazolam      . pantoprazole (PROTONIX) IV  40 mg Intravenous Q12H   Continuous Infusions: . sodium chloride 100 mL/hr at 01/29/19 1244     LOS: 0 days    Time spent: 30 minutes   Barton Dubois, MD [facility] Pager [phone]   01/29/2019, 6:06 PM

## 2019-01-29 NOTE — Consult Note (Signed)
Reason for Consult: Gastric outlet obstruction Referring Physician:   [name] is an 85 y.o. female.  HPI: Admitted thru the ED yesterday. N and V. No vomiting since admission. States has really not eaten anything in 2 days. Per records, also c/o abdominal distention. She states she had a BM before coming to the ED yesterday.  No rectal bleeding. No cough or fever. Throat is dry. No recent travel per patient. She was also noted to be hypohensive in the ED with systolic in the 52W.   Abdominal CT with contrast showed-significant gastric distention with fluid-filled stomach.  No overt ulceration mass or gastric wall thickening.  Relative narrowing at the level of the distal antrum and pyloric region may be secondary to peristalsis of underlying stricture.  Past Medical History:  Diagnosis Date  . Coronary atherosclerosis of native coronary artery    DES x 4 to LAD 1/12  . Essential hypertension, benign   . Fibrocystic breast disease   . Hx of colonic polyps   . Ischemic cardiomyopathy   . Myocardial infarction [facility])    NSTEMI 1/12  . ST elevation (STEMI) myocardial infarction involving left anterior descending coronary artery [facility])    January 2015 - LAD stent thrombosis off DAPT    Past Surgical History:  Procedure Laterality Date  . CATARACT EXTRACTION, BILATERAL     Bilateral  . LEFT HEART CATHETERIZATION WITH CORONARY ANGIOGRAM N/A 11/25/2013   Procedure: LEFT HEART CATHETERIZATION WITH CORONARY ANGIOGRAM;  Surgeon: Debra Grooms, MD;  Location: [facility] CATH LAB;  Service: Cardiovascular;  Laterality: N/A;  . PERCUTANEOUS CORONARY STENT INTERVENTION (PCI-S)  11/25/2013   Procedure: PERCUTANEOUS CORONARY STENT INTERVENTION (PCI-S);  Surgeon: Debra Grooms, MD;  Location: [facility] CATH LAB;  Service: Cardiovascular;;    Family History  Problem Relation Age of Onset  . Coronary artery disease Mother   . Emphysema Father   . Cancer Sister        1 sister/cancer type unknown  .  Stroke Brother        1 brother    Social History:  reports that she has never smoked. She has never used smokeless tobacco. She reports that she does not drink alcohol or use drugs.  Allergies: No Known Allergies  Medications: I have reviewed the patient's current medications.  Results for orders placed or performed during the hospital encounter of 01/28/19 (from the past 48 hour(s))  POC occult blood, ED Provider will collect     Status: Abnormal   Collection Time: 01/28/19 12:29 PM  Result Value Ref Range   Fecal Occult Bld POSITIVE (A) NEGATIVE  Comprehensive metabolic panel     Status: Abnormal   Collection Time: 01/28/19 12:37 PM  Result Value Ref Range   Sodium 131 (L) 135 - 145 mmol/L   Potassium 4.2 3.5 - 5.1 mmol/L   Chloride 103 98 - 111 mmol/L   CO2 17 (L) 22 - 32 mmol/L   Glucose, Bld 129 (H) 70 - 99 mg/dL   BUN 64 (H) 8 - 23 mg/dL   Creatinine, Ser 1.49 (H) 0.44 - 1.00 mg/dL   Calcium 7.6 (L) 8.9 - 10.3 mg/dL   Total Protein 5.6 (L) 6.5 - 8.1 g/dL   Albumin 2.7 (L) 3.5 - 5.0 g/dL   AST 30 15 - 41 U/L   ALT 19 0 - 44 U/L   Alkaline Phosphatase 76 38 - 126 U/L   Total Bilirubin 0.6 0.3 - 1.2 mg/dL  GFR calc non Af Amer 31 (L) >60 mL/min   GFR calc Af Amer 36 (L) >60 mL/min   Anion gap 11 5 - 15    Comment: Performed at [facility], [address], [place], [place] [address]  CBC WITH DIFFERENTIAL     Status: Abnormal   Collection Time: 01/28/19 12:37 PM  Result Value Ref Range   WBC 14.7 (H) 4.0 - 10.5 K/uL   RBC 4.07 3.87 - 5.11 MIL/uL   Hemoglobin 13.5 12.0 - 15.0 g/dL   HCT 39.0 36.0 - 46.0 %   MCV 95.8 80.0 - 100.0 fL   MCH 33.2 26.0 - 34.0 pg   MCHC 34.6 30.0 - 36.0 g/dL   RDW 13.2 11.5 - 15.5 %   Platelets 195 150 - 400 K/uL   nRBC 0.0 0.0 - 0.2 %   Neutrophils Relative % 92 %   Neutro Abs 13.4 (H) 1.7 - 7.7 K/uL   Lymphocytes Relative 3 %   Lymphs Abs 0.5 (L) 0.7 - 4.0 K/uL   Monocytes Relative 5 %   Monocytes Absolute 0.7 0.1 - 1.0 K/uL    Eosinophils Relative 0 %   Eosinophils Absolute 0.0 0.0 - 0.5 K/uL   Basophils Relative 0 %   Basophils Absolute 0.0 0.0 - 0.1 K/uL   Immature Granulocytes 0 %   Abs Immature Granulocytes 0.06 0.00 - 0.07 K/uL    Comment: Performed at [facility], [address], [place], [place] [address]  Protime-INR     Status: Abnormal   Collection Time: 01/28/19 12:37 PM  Result Value Ref Range   Prothrombin Time 16.7 (H) 11.4 - 15.2 seconds   INR 1.4 (H) 0.8 - 1.2    Comment: (NOTE) INR goal varies based on device and disease states. Performed at [facility], [address], [place], [place] [address]   Type and screen [facility]     Status: None   Collection Time: 01/28/19 12:37 PM  Result Value Ref Range   ABO/RH(D) O POS    Antibody Screen NEG    Sample Expiration      01/31/2019 Performed at [facility], [address], [place], [place] [address]   Urinalysis, Routine w reflex microscopic     Status: Abnormal   Collection Time: 01/28/19  3:22 PM  Result Value Ref Range   Color, Urine YELLOW YELLOW   APPearance CLEAR CLEAR   Specific Gravity, Urine 1.038 (H) 1.005 - 1.030   pH 5.0 5.0 - 8.0   Glucose, UA NEGATIVE NEGATIVE mg/dL   Hgb urine dipstick NEGATIVE NEGATIVE   Bilirubin Urine NEGATIVE NEGATIVE   Ketones, ur 5 (A) NEGATIVE mg/dL   Protein, ur NEGATIVE NEGATIVE mg/dL   Nitrite NEGATIVE NEGATIVE   Leukocytes,Ua NEGATIVE NEGATIVE    Comment: Performed at [facility], [address] [place], [place] [address]  Culture, blood (routine x 2)     Status: None (Preliminary result)   Collection Time: 01/28/19  3:33 PM  Result Value Ref Range   Specimen Description BLOOD RIGHT HAND    Special Requests      BOTTLES DRAWN AEROBIC ONLY Blood Culture results may not be optimal due to an inadequate volume of blood received in culture bottles   Culture      NO GROWTH < 24 HOURS Performed at [facility], [address], [place], [place] [address]    Report Status PENDING    Lactic acid, plasma     Status: Abnormal  Collection Time: 01/28/19  3:45 PM  Result Value Ref Range   Lactic Acid, Venous 2.0 (HH) 0.5 - 1.9 mmol/L    Comment: CRITICAL RESULT CALLED TO, READ BACK BY AND VERIFIED WITH: COCKRUM,R AT 1641 ON 3.17.20 BY ISLEY,B Performed at [facility], [address] [place] [address], [place], [place] [address]   Culture, blood (routine x 2)     Status: None (Preliminary result)   Collection Time: 01/28/19  3:50 PM  Result Value Ref Range   Specimen Description LEFT ANTECUBITAL    Special Requests      BOTTLES DRAWN AEROBIC AND ANAEROBIC Blood Culture adequate volume   Culture      NO GROWTH < 24 HOURS Performed at [facility], [address], [place], [place] [address]    Report Status PENDING   Lactic acid, plasma     Status: None   Collection Time: 01/28/19  5:22 PM  Result Value Ref Range   Lactic Acid, Venous 1.1 0.5 - 1.9 mmol/L    Comment: Performed at [facility], [address], [place], [place] [address]  Basic metabolic panel     Status: Abnormal   Collection Time: 01/29/19  4:39 AM  Result Value Ref Range   Sodium 135 135 - 145 mmol/L   Potassium 3.8 3.5 - 5.1 mmol/L   Chloride 112 (H) 98 - 111 mmol/L   CO2 18 (L) 22 - 32 mmol/L   Glucose, Bld 89 70 - 99 mg/dL   BUN 45 (H) 8 - 23 mg/dL   Creatinine, Ser 0.71 0.44 - 1.00 mg/dL   Calcium 7.6 (L) 8.9 - 10.3 mg/dL   GFR calc non Af Amer >60 >60 mL/min   GFR calc Af Amer >60 >60 mL/min   Anion gap 5 5 - 15    Comment: Performed at [facility], [address], [place], [place] [address]    Ct Abdomen Pelvis W Contrast  Result Date: 01/28/2019 CLINICAL DATA:  Abdominal distension, constipation, nausea and vomiting. EXAM: CT ABDOMEN AND PELVIS WITH CONTRAST TECHNIQUE: Multidetector CT imaging of the abdomen and pelvis was performed using the standard protocol following bolus administration of intravenous contrast. CONTRAST:  71mL OMNIPAQUE IOHEXOL 300 MG/ML  SOLN COMPARISON:  None. FINDINGS: Lower  chest: There is atelectasis at the right lung base with a trace pleural effusion. Mild atelectasis at the left lung base. Hepatobiliary: No focal liver abnormality is seen. No gallstones, gallbladder wall thickening, or biliary dilatation. Pancreas: Unremarkable. No pancreatic ductal dilatation or surrounding inflammatory changes. Spleen: Normal in size without focal abnormality. Adrenals/Urinary Tract: Adrenal glands are unremarkable. Kidneys are normal, without renal calculi, focal lesion, or hydronephrosis. Bladder is unremarkable. Stomach/Bowel: The stomach is very distended and fluid-filled without overt wall thickening, visible ulceration or evidence of perforation. There is some relative narrowing at the level of the distal antrum and pyloric region which may be secondary to peristalsis. A component of stenosis/stricture can not be excluded. Small bowel and colon are decompressed without evidence of obstruction, ileus or inflammatory process. No free air identified. No evidence of focal abscess. Vascular/Lymphatic: Atherosclerosis of the abdominal aorta and common iliac arteries without evidence of aneurysmal disease. No enlarged lymph nodes identified in the abdomen or pelvis. Reproductive: Uterus and bilateral adnexa are unremarkable. Other: No abdominal wall hernia or abnormality. No abdominopelvic ascites. Musculoskeletal: No acute or significant osseous findings. IMPRESSION: Significant gastric distension with fluid-filled stomach. There is no overt ulceration, mass or gastric wall thickening. Relative narrowing at the level of the  distal antrum and pyloric region may be secondary to peristalsis or underlying stricture. Electronically Signed   By: Aletta Edouard M.D.   On: 01/28/2019 15:07   Dg Chest Port 1 View  Result Date: 01/28/2019 CLINICAL DATA:  Hypotension. EXAM: PORTABLE CHEST 1 VIEW COMPARISON:  11/25/2013 FINDINGS: Lungs are hypoinflated with mild bibasilar hazy opacification which may be  due to atelectasis or overlying prominent soft tissue structures and much less likely infection. Mild cardiomegaly which is new. Coronary stent present over the left heart unchanged. Remainder the exam is unchanged. IMPRESSION: Mild hazy bibasilar opacification which may be due to atelectasis or overlying prominent soft tissues and less likely infection. Cardiomegaly which is new since the prior exam. Electronically Signed   By: Marin Olp M.D.   On: 01/28/2019 13:03    ROS Blood pressure 132/70, pulse 74, temperature 98.7 F (37.1 C), resp. rate 18, height 5\' 6"  (1.676 m), weight 74 kg, SpO2 95 %. Physical Exam Alert and oriented. Skin warm and dry. Oral mucosa is moist.   . Sclera anicteric, conjunctivae is pink. Thyroid not enlarged. No cervical lymphadenopathy. Lungs clear. Heart regular rate and rhythm.  Abdomen is soft. Bowel sounds are positive. No hepatomegaly. No abdominal masses felt. No tenderness.  No edema to lower extremities..  Assessment/Plan: Possible Gastric outlet obstruction. Vomiting. CT shows gastric distention and narrowing at the level of the distal antrum and pyloric region.  Keep patient NPO. Dr. Laural Golden is aware.  RN with patient will attempt to insert NG tube.   Mahreen Schewe L Umberto Pavek 01/29/2019, 11:32 AM

## 2019-01-30 DIAGNOSIS — S32020D Wedge compression fracture of second lumbar vertebra, subsequent encounter for fracture with routine healing: Secondary | ICD-10-CM

## 2019-01-30 DIAGNOSIS — K209 Esophagitis, unspecified: Secondary | ICD-10-CM

## 2019-01-30 DIAGNOSIS — I10 Essential (primary) hypertension: Secondary | ICD-10-CM

## 2019-01-30 DIAGNOSIS — K210 Gastro-esophageal reflux disease with esophagitis: Secondary | ICD-10-CM

## 2019-01-30 LAB — BASIC METABOLIC PANEL
Anion gap: 6 (ref 5–15)
BUN: 24 mg/dL — ABNORMAL HIGH (ref 8–23)
CHLORIDE: 116 mmol/L — AB (ref 98–111)
CO2: 17 mmol/L — AB (ref 22–32)
Calcium: 7.7 mg/dL — ABNORMAL LOW (ref 8.9–10.3)
Creatinine, Ser: 0.52 mg/dL (ref 0.44–1.00)
GFR calc non Af Amer: >60 mL/min (ref >60)
Glucose, Bld: 71 mg/dL (ref 70–99)
Potassium: 3.9 mmol/L (ref 3.5–5.1)
SODIUM: 139 mmol/L (ref 135–145)

## 2019-01-30 LAB — URINE CULTURE: Culture: NO GROWTH

## 2019-01-30 LAB — CBC
HCT: 31.8 % — ABNORMAL LOW (ref 36.0–46.0)
Hemoglobin: 10.8 g/dL — ABNORMAL LOW (ref 12.0–15.0)
MCH: 32.8 pg (ref 26.0–34.0)
MCHC: 34 g/dL (ref 30.0–36.0)
MCV: 96.7 fL (ref 80.0–100.0)
Platelets: 148 10*3/uL — ABNORMAL LOW (ref 150–400)
RBC: 3.29 MIL/uL — ABNORMAL LOW (ref 3.87–5.11)
RDW: 13.5 % (ref 11.5–15.5)
WBC: 7.4 10*3/uL (ref 4.0–10.5)
nRBC: 0 % (ref 0.0–0.2)

## 2019-01-30 MED ORDER — HYDRALAZINE HCL 20 MG/ML IJ SOLN
10.0000 mg | Freq: Four times a day (QID) | INTRAMUSCULAR | Status: DC | PRN
  Administered 2019-01-30: 10 mg via INTRAVENOUS
  Filled 2019-01-30: qty 1

## 2019-01-30 MED ORDER — ACETAMINOPHEN 325 MG PO TABS
650.0000 mg | ORAL_TABLET | Freq: Four times a day (QID) | ORAL | Status: DC | PRN
  Administered 2019-01-30: 650 mg via ORAL
  Filled 2019-01-30: qty 2

## 2019-01-30 NOTE — NC FL2 (Signed)
  [place] LEVEL OF CARE SCREENING TOOL     IDENTIFICATION  Patient Name: Debra Doyle Birthdate: 1929/02/13 Sex: female Admission Date (Current Location): 01/28/2019  [facility] and [place] Number:  [employer] and Address:  [facility] [address] [place]      Provider Number: [id number]  Attending Physician Name and Address:  Barton Dubois, MD  Relative Name and Phone Number:       Current Level of Care: Hospital Recommended Level of Care: [place] Prior Approval Number:    Date Approved/Denied:   PASRR Number: [id number]  Discharge Plan: SNF    Current Diagnoses: Patient Active Problem List   Diagnosis Date Noted  . AKI (acute kidney injury) ([place]) 01/28/2019  . Closed fracture of third metatarsal bone of right foot 11/26/17 01/21/2018  . Closed nondisplaced fracture of proximal phalanx of right little finger 01/21/2018  . Ischemic cardiomyopathy 01/09/2014  . Mixed hyperlipidemia 12/29/2010  . Essential hypertension, benign 12/29/2010  . Coronary atherosclerosis of native coronary artery 12/29/2010    Orientation RESPIRATION BLADDER Height & Weight     Self, Time, Situation, Place  Normal Continent Weight: 163 lb 2.3 oz (74 kg) Height:  5\' 6"  (167.6 cm)  BEHAVIORAL SYMPTOMS/MOOD NEUROLOGICAL BOWEL NUTRITION STATUS      Continent Diet(see discharge summary )  AMBULATORY STATUS COMMUNICATION OF NEEDS Skin   Limited Assist Verbally Normal                       Personal Care Assistance Level of Assistance  Bathing, Feeding, Dressing Bathing Assistance: Limited assistance Feeding assistance: Independent Dressing Assistance: Limited assistance     Functional Limitations Info  Sight, Hearing, Speech Sight Info: Adequate Hearing Info: Adequate Speech Info: Adequate    SPECIAL CARE FACTORS FREQUENCY  PT (By licensed PT)     PT Frequency: 5x/week              Contractures  Contractures Info: Not present    Additional Factors Info  Code Status, Allergies Code Status Info: DNR Allergies Info: NKA           Current Medications (01/30/2019):  This is the current hospital active medication list Current Facility-Administered Medications  Medication Dose Route Frequency Provider Last Rate Last Dose  . acetaminophen (TYLENOL) tablet 650 mg  650 mg Oral Q6H PRN Barton Dubois, MD   650 mg at 01/30/19 0849  . lidocaine (LIDODERM) 5 % 1 patch  1 patch Transdermal Q24H Barton Dubois, MD   1 patch at 01/29/19 1853  . metoCLOPramide (REGLAN) injection 5 mg  5 mg Intravenous Q6H Rehman, Najeeb U, MD   5 mg at 01/30/19 1438  . pantoprazole (PROTONIX) injection 40 mg  40 mg Intravenous Q12H Rogene Houston, MD   40 mg at 01/30/19 0849  . phenol (CHLORASEPTIC) mouth spray 1 spray  1 spray Mouth/Throat PRN Barton Dubois, MD         Discharge Medications: Please see discharge summary for a list of discharge medications.  Relevant Imaging Results:  Relevant Lab Results:   Additional Information SSN [id number] [address] Clydene Pugh, LCSW

## 2019-01-30 NOTE — TOC Initial Note (Signed)
Transition of Care [facility]) - Initial/Assessment Note    Patient Details  Name: Debra Doyle MRN: [id number] Date of Birth: 26-Oct-1929  [facility] of [facility]) CM/SW Contact:    Ihor Gully, LCSW Phone Number: 01/30/2019, 4:12 PM  Clinical Narrative:                  At baseline prior to onset of illness, patient ambulates with a cane. She has a two wheeled walker but indicates that it "goes too fast." Patient states that she drives short distances.  She states that she has support from two of her sons. Discharge plan was discussed and patient is agreeable to SNF for short term rehab. She has family support as her son and daughter in law live beside her.  She maintains her PCP visits.   Expected Discharge Plan: Skilled Nursing Facility Barriers to Discharge: No Barriers Identified   Patient Goals and CMS Choice Patient states their goals for this hospitalization and ongoing recovery are:: To return home to her baseline state of independence.  CMS Medicare.gov Compare Post Acute Care list provided to:: Patient Choice offered to / list presented to : Patient  Expected Discharge Plan and Services Expected Discharge Plan: [place]   Discharge Planning Services: CM Consult Post Acute Care Choice: [place] Living arrangements for the past 2 months: Single Family Home                          Prior Living Arrangements/Services Living arrangements for the past 2 months: Single Family Home Lives with:: Self Patient language and need for interpreter reviewed:: No Do you feel safe going back to the place where you live?: Yes      Need for Family Participation in Patient Care: Yes (Comment)(Patient will need their continued support. ) Care giver support system in place?: Yes (comment)(Son and daughter in law live next door.) Current home services: DME(Patient has cane and two wheeled walker.) Criminal Activity/Legal Involvement Pertinent to Current  Situation/Hospitalization: No - Comment as needed  Activities of Daily Living Home Assistive Devices/Equipment: Environmental consultant (specify type), Cane (specify quad or straight) ADL Screening (condition at time of admission) Patient's cognitive ability adequate to safely complete daily activities?: Yes Is the patient deaf or have difficulty hearing?: No Does the patient have difficulty seeing, even when wearing glasses/contacts?: No Does the patient have difficulty concentrating, remembering, or making decisions?: No Patient able to express need for assistance with ADLs?: Yes Does the patient have difficulty dressing or bathing?: No Independently performs ADLs?: No Communication: Independent Dressing (OT): Dependent, Needs assistance Is this a change from baseline?: Pre-admission baseline Grooming: Needs assistance, Dependent Is this a change from baseline?: Pre-admission baseline Feeding: Independent Bathing: Needs assistance, Dependent Is this a change from baseline?: Pre-admission baseline Toileting: Needs assistance Is this a change from baseline?: Pre-admission baseline In/Out Bed: Needs assistance Is this a change from baseline?: Pre-admission baseline Walks in Home: Needs assistance Is this a change from baseline?: Pre-admission baseline Does the patient have difficulty walking or climbing stairs?: Yes Weakness of Legs: Both Weakness of Arms/Hands: None  Permission Sought/Granted   Permission granted to share information with : Yes, Verbal Permission Granted  Share Information with NAME: son, Shanon Brow; DIL, Mattie  Permission granted to share info w AGENCY: [facility] and [place] granted to share info w Relationship: son and daughter in Sports [employer]  Permission granted to share info w Contact Information: son  and daughter in law  Emotional Assessment Appearance:: Appears stated age Attitude/Demeanor/Rapport: Ambitious, Gracious Affect (typically observed): Accepting,  Calm Orientation: : Oriented to Self, Oriented to Place, Oriented to  Time, Oriented to Situation Alcohol / Substance Use: Not Applicable Psych Involvement: No (comment)  Admission diagnosis:  Dehydration [E86.0] AKI (acute kidney injury) ([place]) [N17.9] Hypotension [I95.9] Closed compression fracture of L2 lumbar vertebra with delayed healing, subsequent encounter [S32.020G] Patient Active Problem List   Diagnosis Date Noted  . AKI (acute kidney injury) ([place]) 01/28/2019  . Closed fracture of third metatarsal bone of right foot 11/26/17 01/21/2018  . Closed nondisplaced fracture of proximal phalanx of right little finger 01/21/2018  . Ischemic cardiomyopathy 01/09/2014  . Mixed hyperlipidemia 12/29/2010  . Essential hypertension, benign 12/29/2010  . Coronary atherosclerosis of native coronary artery 12/29/2010   PCP:  [name], MD Pharmacy:   [facility], [place] [address] [place] [address] Phone: [phone] Fax: [phone]  [id number] - [place], [place] [address] [place] [address] Phone: [phone] Fax: [phone]     Social Determinants of Health (SDOH) Interventions    Readmission Risk Interventions No flowsheet data found.

## 2019-01-30 NOTE — Progress Notes (Addendum)
PROGRESS NOTE    Debra Doyle  XVQ:[id number] DOB: 07/31/1929 DOA: 01/28/2019 PCP: [name], MD     Brief Narrative:  85 y.o. female with medical history significant for hypertension, hyperlipidemia, coronary artery disease, who was brought to the ED with complaints of vomiting over the past 2 days, with barely any oral. intake.  And also reports abdominal distention mostly upper abdomen, with pain.  No loose stools.  No burning with urination.  Family reports a lot of belching over the past few days. No blood in stools, No black stools.  And has been constipated, she was given Dulcolax at home , after which patient had a good bowel movement. Patient lives alone but family lives next door, and check on her daily.   Patient has had persistent back pain, was seen in the ED 01/17/19 for same, MRI lumbar spine showed L2 compression fracture.  Since discharge from the ED patient has been sitting in a recliner, not ambulatory, due to pain. At baseline patient has mild/mild cognitive deficits, ambulates with a cane.  She denies fevers or chills, no sore throat, no nasal congestion, no difficulty breathing or cough.  No sick contacts, and no recent travel.   Assessment & Plan: 1-gastric outlet obstruction -NG tube in place removed. -advance diet -EGD demonstrating duodenal ulcer and esophagitis. -Continue reglan and PPI.  2-acute kidney injury -In the setting of hypotension and dehydration -Continue holding nephrotoxic agents for another 24 hours. -Continue fluid resuscitation -Follow renal function trend. -Cr essentially back to normal  3-essential hypertension -Currently experiencing low blood pressure -Will continue holding antihypertensive agents. -Continue IV fluids.  4-lower back pain with recent evaluation demonstrating L2 compression fracture -Continue TLSO brace -As needed pain medication -LidoDerm patch and tylenol for pain.  5-history of coronary artery disease -Patient  with NSTEMI in 2012 and a STEMI in 2015. -No chest pain, no shortness of breath -Will resume aspirin, Lipitor, beta-blocker, losartan and spironolactone once fully able to tolerate by mouth again.   DVT prophylaxis: SCDs Code Status: DNR Family Communication: Daughter and sons at bedside Disposition Plan: Remains in the hospital.  Advance diet further as per GI recommendations.  Continue PPI, after evaluation by physical therapy patient will go to a skilled nursing facility for short-term rehab.  Continue Tylenol, lidoderm patch and TLSO brace.  Consultants:   GI service  Procedures:   See below for x-ray reports  Antimicrobials:  Anti-infectives (From admission, onward)   None      Subjective: Afebrile, no chest pain, no vomiting.  Still complaining of lower back pain.  Has tolerated clear liquid diet and is hungry and asking to have her diet advanced.  Objective: Vitals:   01/29/19 2000 01/29/19 2119 01/30/19 0446 01/30/19 1347  BP:  132/73 (!) 146/78 123/80  Pulse:  83 81 92  Resp:  20 16 17   Temp:  98.3 F (36.8 C) 97.9 F (36.6 C)   TempSrc:  Oral Oral   SpO2: 93% 95% 97% 99%  Weight:      Height:        Intake/Output Summary (Last 24 hours) at 01/30/2019 1807 Last data filed at 01/30/2019 1300 Gross per 24 hour  Intake 2292.7 ml  Output 760 ml  Net 1532.7 ml   Filed Weights   01/28/19 1212 01/28/19 1853  Weight: 71.7 kg 74 kg    Examination: General exam: Alert, awake, oriented x 2; afebrile, no nausea, no vomiting, tolerating liquid diet, in no acute distress and  wanted to eat.  Still having pain in her lower back and feeling weak. Respiratory system: Clear to auscultation. Respiratory effort normal. Cardiovascular system:RRR. No murmurs, rubs, gallops. Gastrointestinal system: Abdomen is nondistended, soft and nontender. No organomegaly or masses felt. Normal bowel sounds heard. Central nervous system: Alert and oriented. No focal neurological  deficits. Extremities: No C/C/E, +pedal pulses Skin: No rashes, lesions or ulcers Psychiatry: Judgement and insight appear normal. Mood & affect appropriate.   Data Reviewed: I have personally reviewed following labs and imaging studies  CBC: Recent Labs  Lab 01/28/19 1237 01/30/19 0404  WBC 14.7* 7.4  NEUTROABS 13.4*  --   HGB 13.5 10.8*  HCT 39.0 31.8*  MCV 95.8 96.7  PLT 195 161*   Basic Metabolic Panel: Recent Labs  Lab 01/28/19 1237 01/29/19 0439 01/30/19 0404  NA 131* 135 139  K 4.2 3.8 3.9  CL 103 112* 116*  CO2 17* 18* 17*  GLUCOSE 129* 89 71  BUN 64* 45* 24*  CREATININE 1.49* 0.71 0.52  CALCIUM 7.6* 7.6* 7.7*   GFR: Estimated Creatinine Clearance: 51 mL/min (by C-G formula based on SCr of 0.52 mg/dL).   Liver Function Tests: Recent Labs  Lab 01/28/19 1237  AST 30  ALT 19  ALKPHOS 76  BILITOT 0.6  PROT 5.6*  ALBUMIN 2.7*   Coagulation Profile: Recent Labs  Lab 01/28/19 1237  INR 1.4*   Urine analysis:    Component Value Date/Time   COLORURINE YELLOW 01/28/2019 1522   APPEARANCEUR CLEAR 01/28/2019 1522   LABSPEC 1.038 (H) 01/28/2019 1522   PHURINE 5.0 01/28/2019 1522   GLUCOSEU NEGATIVE 01/28/2019 1522   HGBUR NEGATIVE 01/28/2019 1522   BILIRUBINUR NEGATIVE 01/28/2019 1522   KETONESUR 5 (A) 01/28/2019 1522   PROTEINUR NEGATIVE 01/28/2019 1522   UROBILINOGEN 0.2 12/10/2010 1555   NITRITE NEGATIVE 01/28/2019 1522   LEUKOCYTESUR NEGATIVE 01/28/2019 1522    Recent Results (from the past 240 hour(s))  Urine culture     Status: None   Collection Time: 01/28/19  3:22 PM  Result Value Ref Range Status   Specimen Description   Final    URINE, RANDOM Performed at [facility], [address], [place], [place] [address]    Special Requests   Final    NONE Performed at [facility], [address] [place], [place] [address]    Culture   Final    NO GROWTH Performed at [facility], [place] [address], [place], [place] [address]     Report Status 01/30/2019 FINAL  Final  Culture, blood (routine x 2)     Status: None (Preliminary result)   Collection Time: 01/28/19  3:33 PM  Result Value Ref Range Status   Specimen Description BLOOD RIGHT HAND  Final   Special Requests   Final    BOTTLES DRAWN AEROBIC ONLY Blood Culture results may not be optimal due to an inadequate volume of blood received in culture bottles   Culture   Final    NO GROWTH 2 DAYS Performed at [facility], [address] [place],  [address]    Report Status PENDING  Incomplete  Culture, blood (routine x 2)     Status: None (Preliminary result)   Collection Time: 01/28/19  3:50 PM  Result Value Ref Range Status   Specimen Description LEFT ANTECUBITAL  Final   Special Requests   Final    BOTTLES DRAWN AEROBIC AND ANAEROBIC Blood Culture adequate volume   Culture   Final  NO GROWTH 2 DAYS Performed at [facility], [address], [place], [place] [address]    Report Status PENDING  Incomplete     Radiology Studies: Dg Chest Port 1 View  Result Date: 01/29/2019 CLINICAL DATA:  NG placement EXAM: PORTABLE CHEST 1 VIEW COMPARISON:  01/28/2019 FINDINGS: NG tube in the stomach. Cardiac enlargement without heart failure. Mild bibasilar airspace disease unchanged. Possible pneumonia or atelectasis. IMPRESSION: NG in the stomach Mild bibasilar airspace disease unchanged. Electronically Signed   By: Franchot Gallo M.D.   On: 01/29/2019 13:36    Scheduled Meds: . lidocaine  1 patch Transdermal Q24H  . metoCLOPramide (REGLAN) injection  5 mg Intravenous Q6H  . pantoprazole (PROTONIX) IV  40 mg Intravenous Q12H   Continuous Infusions:    LOS: 1 day    Time spent: 25 minutes   Barton Dubois, MD [facility] Pager [phone]   01/30/2019, 6:07 PM

## 2019-01-30 NOTE — Plan of Care (Signed)
  Problem: Acute Rehab PT Goals(only PT should resolve) Goal: Pt Will Go Supine/Side To Sit Outcome: Progressing Flowsheets (Taken 01/30/2019 1228) Pt will go Supine/Side to Sit: with min guard assist Goal: Patient Will Transfer Sit To/From Stand Outcome: Progressing Flowsheets (Taken 01/30/2019 1228) Patient will transfer sit to/from stand: with min guard assist Goal: Pt Will Transfer Bed To Chair/Chair To Bed Outcome: Progressing Flowsheets (Taken 01/30/2019 1228) Pt will Transfer Bed to Chair/Chair to Bed: min guard assist Goal: Pt Will Ambulate Outcome: Progressing Flowsheets (Taken 01/30/2019 1228) Pt will Ambulate: 50 feet; with min guard assist; with rolling walker   12:29 PM, 01/30/19 Lonell Grandchild, MPT Physical Therapist with [facility] [phone] office [phone] mobile phone

## 2019-01-30 NOTE — Progress Notes (Addendum)
Patient ID: Debra Doyle, female   DOB: [birth year], 85 y.o.   MRN: [id number] Alert. Eating clear liquid diet. States she has some nausea, but no vomiting. No BM. Blood pressure (!) 146/78, pulse 81, temperature 97.9 F (36.6 C), temperature source Oral, resp. rate 16, height 5\' 6"  (1.676 m), weight 74 kg, SpO2 97 %. Will continue to monitor.    GI attending note:  Patient does not appear to be having any side effects with IV metoclopramide. Diet has been advanced and she denies abdominal pain or vomiting. H. pylori is pending.

## 2019-01-30 NOTE — Evaluation (Signed)
Physical Therapy Evaluation Patient Details Name: Debra Doyle MRN: [id number] DOB: Oct 11, 1929 Today's Date: 01/30/2019   History of Present Illness  Debra Doyle is a 85 y.o. female with medical history significant for hypertension, hyperlipidemia, coronary artery disease, who was brought to the ED with complaints of vomiting over the past 2 days, with barely any oral. intake.  And also reports abdominal distention mostly upper abdomen, with pain.  No loose stools.  No burning with urination.  Family reports a lot of belching over the past few days. No blood in stools, No black stools.  And has been constipated, she was given Dulcolax at home , after which patient had a good bowel movement.    Clinical Impression  Patient required Max assist to don/doff TLSO, patients' son present and acknowledged understanding for donning/doffing after instruction from [place].  Patient demonstrates slow labored movement for sitting up at bedside, slightly apprehensive due to low back pain, able to walk to doorway and back to bedside with unsteady labored cadence, limited mostly due to fatigue, c/o most pain in low back during stand to sitting possibly due to flexing trunk.  Patient tolerated sitting up in chair after therapy with her son present in room.  Patient will benefit from continued physical therapy in hospital and recommended venue below to increase strength, balance, endurance for safe ADLs and gait.    Follow Up Recommendations SNF;Supervision/Assistance - 24 hour;Supervision for mobility/OOB    Equipment Recommendations  None recommended by PT    Recommendations for Other Services       Precautions / Restrictions Precautions Precautions: Fall Required Braces or Orthoses: Spinal Brace Spinal Brace: Thoracolumbosacral orthotic Restrictions Weight Bearing Restrictions: No      Mobility  Bed Mobility Overal bed mobility: Needs Assistance Bed Mobility: Supine to Sit     Supine to sit: Min  assist     General bed mobility comments: increased time slow labored movement  Transfers Overall transfer level: Needs assistance Equipment used: Rolling walker (2 wheeled) Transfers: Sit to/from [facility] Sit to Stand: Min assist Stand pivot transfers: Min assist       General transfer comment: slightly unsteady due to BLE weakness  Ambulation/Gait Ambulation/Gait assistance: Min assist Gait Distance (Feet): 20 Feet Assistive device: Rolling walker (2 wheeled) Gait Pattern/deviations: Decreased step length - right;Decreased step length - left;Decreased stride length Gait velocity: decreased   General Gait Details: slow labored unsteady cadence limited mostly due to c/o fatigue, no loss of balance  Stairs            Wheelchair Mobility    Modified Rankin (Stroke Patients Only)       Balance Overall balance assessment: Needs assistance Sitting-balance support: Feet supported;No upper extremity supported Sitting balance-Leahy Scale: Fair     Standing balance support: Bilateral upper extremity supported;During functional activity Standing balance-Leahy Scale: Fair Standing balance comment: using RW                             Pertinent Vitals/Pain Pain Assessment: Faces Faces Pain Scale: Hurts even more Pain Location: low back Pain Descriptors / Indicators: Sharp;Grimacing;Discomfort Pain Intervention(s): Limited activity within patient's tolerance;Monitored during session;Premedicated before session;Repositioned    Home Living Family/patient expects to be discharged to:: Private residence Living Arrangements: Alone Available Help at Discharge: Family;Available 24 hours/day Type of Home: House Home Access: Stairs to enter Entrance Stairs-Rails: Left Entrance Stairs-Number of Steps: 4 Home Layout: Two level;Able  to live on main level with bedroom/bathroom Home Equipment: Walker - 4 wheels;Walker - 2 wheels;Cane - single  point      Prior Function Level of Independence: Independent with assistive device(s)         Comments: household and short distanced community ambulator with RW     Hand Dominance        Extremity/Trunk Assessment   Upper Extremity Assessment Upper Extremity Assessment: Generalized weakness    Lower Extremity Assessment Lower Extremity Assessment: Generalized weakness    Cervical / Trunk Assessment Cervical / Trunk Assessment: Kyphotic  Communication   Communication: No difficulties  Cognition Arousal/Alertness: Awake/alert Behavior During Therapy: WFL for tasks assessed/performed Overall Cognitive Status: Within Functional Limits for tasks assessed                                        General Comments      Exercises     Assessment/Plan    PT Assessment Patient needs continued PT services  PT Problem List Decreased strength;Decreased activity tolerance;Decreased balance;Decreased mobility       PT Treatment Interventions Therapeutic exercise;Gait training;Stair training;Functional mobility training;Therapeutic activities;Patient/family education    PT Goals (Current goals can be found in the Care Plan section)  Acute Rehab PT Goals Patient Stated Goal: return home with family to assist PT Goal Formulation: With patient/family Time For Goal Achievement: 02/13/19 Potential to Achieve Goals: Good    Frequency Min 3X/week   Barriers to discharge        Co-evaluation               AM-PAC PT "6 Clicks" Mobility  Outcome Measure Help needed turning from your back to your side while in a flat bed without using bedrails?: A Little Help needed moving from lying on your back to sitting on the side of a flat bed without using bedrails?: A Little Help needed moving to and from a bed to a chair (including a wheelchair)?: A Little Help needed standing up from a chair using your arms (e.g., wheelchair or bedside chair)?: A Little Help  needed to walk in hospital room?: A Lot Help needed climbing 3-5 steps with a railing? : A Lot 6 Click Score: 16    End of Session Equipment Utilized During Treatment: Gait belt;Back brace;Other (comment)(TSLO) Activity Tolerance: Patient tolerated treatment well;Patient limited by fatigue Patient left: in chair;with call bell/phone within reach;with family/visitor present Nurse Communication: Mobility status PT Visit Diagnosis: Unsteadiness on feet (R26.81);Other abnormalities of gait and mobility (R26.89);Muscle weakness (generalized) (M62.81)    Time: 1020-1055 PT Time Calculation (min) (ACUTE ONLY): 35 min   Charges:   PT Evaluation $PT Eval Moderate Complexity: 1 Mod PT Treatments $Therapeutic Activity: 23-37 mins        12:27 PM, 01/30/19 Lonell Grandchild, MPT Physical Therapist with [facility] [phone] office [phone] mobile phone

## 2019-01-31 ENCOUNTER — Inpatient Hospital Stay
Admission: RE | Admit: 2019-01-31 | Discharge: 2019-02-14 | Disposition: A | Discharge status: Home / Self Care | Payer: PPO | Source: Ambulatory Visit | Attending: Internal Medicine | Admitting: Internal Medicine

## 2019-01-31 ENCOUNTER — Encounter (HOSPITAL_COMMUNITY): Payer: Self-pay | Admitting: Internal Medicine

## 2019-01-31 DIAGNOSIS — Z741 Need for assistance with personal care: Secondary | ICD-10-CM | POA: Diagnosis not present

## 2019-01-31 DIAGNOSIS — I10 Essential (primary) hypertension: Secondary | ICD-10-CM | POA: Diagnosis not present

## 2019-01-31 DIAGNOSIS — E782 Mixed hyperlipidemia: Secondary | ICD-10-CM | POA: Diagnosis not present

## 2019-01-31 DIAGNOSIS — I340 Nonrheumatic mitral (valve) insufficiency: Secondary | ICD-10-CM | POA: Diagnosis not present

## 2019-01-31 DIAGNOSIS — R4189 Other symptoms and signs involving cognitive functions and awareness: Secondary | ICD-10-CM | POA: Diagnosis not present

## 2019-01-31 DIAGNOSIS — K269 Duodenal ulcer, unspecified as acute or chronic, without hemorrhage or perforation: Secondary | ICD-10-CM | POA: Diagnosis not present

## 2019-01-31 DIAGNOSIS — K2100 Gastro-esophageal reflux disease with esophagitis, without bleeding: Secondary | ICD-10-CM

## 2019-01-31 DIAGNOSIS — M4846XD Fatigue fracture of vertebra, lumbar region, subsequent encounter for fracture with routine healing: Secondary | ICD-10-CM | POA: Diagnosis not present

## 2019-01-31 DIAGNOSIS — S32020S Wedge compression fracture of second lumbar vertebra, sequela: Secondary | ICD-10-CM | POA: Diagnosis not present

## 2019-01-31 DIAGNOSIS — R262 Difficulty in walking, not elsewhere classified: Secondary | ICD-10-CM | POA: Diagnosis not present

## 2019-01-31 DIAGNOSIS — M549 Dorsalgia, unspecified: Secondary | ICD-10-CM | POA: Diagnosis not present

## 2019-01-31 DIAGNOSIS — E785 Hyperlipidemia, unspecified: Secondary | ICD-10-CM | POA: Diagnosis not present

## 2019-01-31 DIAGNOSIS — R112 Nausea with vomiting, unspecified: Secondary | ICD-10-CM | POA: Diagnosis not present

## 2019-01-31 DIAGNOSIS — I9589 Other hypotension: Secondary | ICD-10-CM | POA: Diagnosis not present

## 2019-01-31 DIAGNOSIS — N179 Acute kidney failure, unspecified: Secondary | ICD-10-CM | POA: Diagnosis not present

## 2019-01-31 DIAGNOSIS — I361 Nonrheumatic tricuspid (valve) insufficiency: Secondary | ICD-10-CM | POA: Diagnosis not present

## 2019-01-31 DIAGNOSIS — I2510 Atherosclerotic heart disease of native coronary artery without angina pectoris: Secondary | ICD-10-CM | POA: Diagnosis not present

## 2019-01-31 DIAGNOSIS — I2729 Other secondary pulmonary hypertension: Secondary | ICD-10-CM | POA: Diagnosis not present

## 2019-01-31 DIAGNOSIS — K279 Peptic ulcer, site unspecified, unspecified as acute or chronic, without hemorrhage or perforation: Secondary | ICD-10-CM

## 2019-01-31 DIAGNOSIS — E860 Dehydration: Secondary | ICD-10-CM | POA: Diagnosis not present

## 2019-01-31 DIAGNOSIS — R41841 Cognitive communication deficit: Secondary | ICD-10-CM | POA: Diagnosis not present

## 2019-01-31 DIAGNOSIS — I255 Ischemic cardiomyopathy: Secondary | ICD-10-CM | POA: Diagnosis not present

## 2019-01-31 DIAGNOSIS — Z4659 Encounter for fitting and adjustment of other gastrointestinal appliance and device: Secondary | ICD-10-CM | POA: Diagnosis not present

## 2019-01-31 DIAGNOSIS — S32020G Wedge compression fracture of second lumbar vertebra, subsequent encounter for fracture with delayed healing: Secondary | ICD-10-CM | POA: Diagnosis not present

## 2019-01-31 DIAGNOSIS — M6281 Muscle weakness (generalized): Secondary | ICD-10-CM | POA: Diagnosis not present

## 2019-01-31 DIAGNOSIS — K209 Esophagitis, unspecified: Secondary | ICD-10-CM | POA: Diagnosis not present

## 2019-01-31 DIAGNOSIS — I5030 Unspecified diastolic (congestive) heart failure: Secondary | ICD-10-CM | POA: Diagnosis not present

## 2019-01-31 DIAGNOSIS — S32020D Wedge compression fracture of second lumbar vertebra, subsequent encounter for fracture with routine healing: Secondary | ICD-10-CM | POA: Diagnosis not present

## 2019-01-31 DIAGNOSIS — R600 Localized edema: Secondary | ICD-10-CM | POA: Diagnosis not present

## 2019-01-31 DIAGNOSIS — S32020A Wedge compression fracture of second lumbar vertebra, initial encounter for closed fracture: Secondary | ICD-10-CM

## 2019-01-31 LAB — H. PYLORI ANTIBODY, IGG: H Pylori IgG: 0.35 Index Value (ref 0.00–0.79)

## 2019-01-31 MED ORDER — TRAMADOL HCL 50 MG PO TABS: 50.0000 mg | ORAL_TABLET | Freq: Two times a day (BID) | ORAL | 0 refills | Status: DC | PRN

## 2019-01-31 MED ORDER — PANTOPRAZOLE SODIUM 40 MG PO TBEC: 40.0000 mg | DELAYED_RELEASE_TABLET | Freq: Two times a day (BID) | ORAL | Status: DC

## 2019-01-31 MED ORDER — ACETAMINOPHEN 325 MG PO TABS: 650.0000 mg | ORAL_TABLET | Freq: Four times a day (QID) | ORAL | Status: AC | PRN

## 2019-01-31 MED ORDER — LIDOCAINE 5 % EX PTCH: 1.0000 | MEDICATED_PATCH | CUTANEOUS | 0 refills | Status: DC

## 2019-01-31 MED ORDER — ASPIRIN EC 81 MG PO TBEC: 81.0000 mg | DELAYED_RELEASE_TABLET | Freq: Every day | ORAL | Status: DC

## 2019-01-31 NOTE — Progress Notes (Signed)
Patient denies nausea or vomiting. She continues to complain of back pain. Her appetite has been poor but she is trying to eat some. She continues to complain of back pain. H. pylori serology is negative.  Patient has been discharged to a nursing home for rehab. Patient will need to continue PPI and a twice daily schedule for at least 12 weeks. Patient's condition discussed with his son Shanon Brow finger and his wife. Patient got into trouble with narcotics she may be a candidate for kyphoplasty.   They may want to discuss this with physician at nursing home her primary care physician if pain persists.

## 2019-01-31 NOTE — Discharge Summary (Signed)
Physician Discharge Summary  Debra Doyle OHY:[id number] DOB: 1929-05-20 DOA: 01/28/2019  PCP: [name], MD  Admit date: 01/28/2019 Discharge date: 01/31/2019  Time spent: 35 minutes  Recommendations for Outpatient Follow-up:  1. Repeat basic metabolic panel to follow electrolytes and renal function 2. Reassess blood pressure and further adjust antihypertensive regimen   Discharge Diagnoses:  Active Problems:   Benign essential HTN   AKI (acute kidney injury) (HCC)   Closed compression fracture of second lumbar vertebra (HCC)   Dehydration   Gastroesophageal reflux disease with esophagitis   PUD (peptic ulcer disease)   Discharge Condition: Stable and improved.  Patient discharged to skilled nursing facility for further care and rehabilitation.  Diet recommendation: Heart healthy diet  Filed Weights   01/28/19 1212 01/28/19 1853  Weight: 71.7 kg 74 kg    Brief History of present illness:  85 y.o.femalewith medical history significant forhypertension, hyperlipidemia, coronary artery disease, who was brought to the ED with complaints of vomiting over the past 2 days,with barely anyoral. intake. And also reports abdominal distention mostly upper abdomen, with pain. No loose stools.No burning with urination. Family reports a lot of belching over the past few days. No blood in stools, No black stools. And has been constipated, she was given Dulcolax at home , after whichpatient had a good bowel movement. Patient lives alone but family lives next door, andcheck on her daily.  Patient has had persistent back pain, was seen in the ED 3/6/20for same,MRI lumbar spine showed L2 compression fracture. Since discharge from the ED patient has been sitting in a recliner, not ambulatory,due to pain. At baseline patient has mild/mild cognitive deficits,ambulateswith a cane.She denies fevers or chills, no sore throat, no nasal congestion, no difficulty breathing or cough. No  sickcontacts, andno recent travel.  Hospital Course:  1-gastric outlet obstruction -tolerating diet  -no nausea, no vomiting, no abd pain. -EGD demonstrating duodenal ulcer and esophagitis. -Continue PPI BID -avoid NSAID's -delay starting ASA as instructed.  2-acute kidney injury -In the setting of hypotension and dehydration -Creatinine back to normal -Safe to resume home antihypertensive regimen -Continue maintaining adequate hydration follow heart healthy diet. -Repeat basic metabolic panel in 1 week to follow electrolytes and renal function.  3-essential hypertension -Blood pressure stable at discharge -Follow heart healthy diet. -Resume home antihypertensive regimen -Maintain adequate hydration.  4-lower back pain with recent evaluation demonstrating L2 compression fracture -Continue TLSO brace -As needed pain medication -LidoDerm patch and tylenol for pain.  5-history of coronary artery disease -Patient with NSTEMI in 2012 and a STEMI in 2015. -No chest pain, no shortness of breath -Will resume aspirin, Lipitor, beta-blocker, losartan and spironolactone    Procedures: EGD: Demonstrating duodenal ulcer and esophagitis.  Consultations:  GI  Discharge Exam: Vitals:   01/30/19 2301 01/31/19 0548  BP: (!) 150/81 (!) 179/92  Pulse: 92 89  Resp:    Temp:  98.9 F (37.2 C)  SpO2: 99% 97%   General exam: Alert, awake, oriented x 2; afebrile, no nausea, no vomiting, tolerating diet, in no acute distress and ready to go to SNF for discharge.  Still having pain in her lower back and feeling weak. Respiratory system: Clear to auscultation. Respiratory effort normal. Cardiovascular system:RRR. No murmurs, rubs, gallops. Gastrointestinal system: Abdomen is nondistended, soft and nontender. No organomegaly or masses felt. Normal bowel sounds heard. Central nervous system: Alert and oriented. No focal neurological deficits. Extremities: No C/C/E, +pedal  pulses Skin: No rashes, lesions or ulcers Psychiatry: Judgement and  insight appear normal. Mood & affect appropriate.    Discharge Instructions   Discharge Instructions    Diet - low sodium heart healthy   Complete by:  As directed    Increase activity slowly   Complete by:  As directed    Take medications as prescribed Physical therapy and rehabilitation as per skilled nursing facility protocol     Allergies as of 01/31/2019   No Known Allergies     Medication List    STOP taking these medications   Bilberry 100 MG Caps   Cinnamon 500 MG capsule   HYDROcodone-acetaminophen 5-325 MG tablet Commonly known as:  NORCO/VICODIN     TAKE these medications   acetaminophen 325 MG tablet Commonly known as:  TYLENOL Take 2 tablets (650 mg total) by mouth every 6 (six) hours as needed for fever or headache (pain).   aspirin EC 81 MG tablet Take 1 tablet (81 mg total) by mouth daily. Start taking on:  February 04, 2019 What changed:  These instructions start on February 04, 2019. If you are unsure what to do until then, ask your doctor or other care provider.   atorvastatin 40 MG tablet Commonly known as:  LIPITOR TAKE 1 TABLET (40 MG TOTAL) BY MOUTH DAILY AT 6 PM. What changed:    how much to take  how to take this  when to take this   carvedilol 6.25 MG tablet Commonly known as:  COREG Take 1 tablet (6.25 mg total) by mouth 2 (two) times daily.   Fish Oil 1000 MG Caps Take 1 capsule by mouth daily.   lidocaine 5 % Commonly known as:  LIDODERM Place 1 patch onto the skin daily. Remove & Discard patch within 12 hours or as directed by MD   losartan 50 MG tablet Commonly known as:  COZAAR TAKE 1 TABLET BY MOUTH EVERY DAY   Magnesium 250 MG Tabs Take 1 tablet by mouth daily.   methocarbamol 500 MG tablet Commonly known as:  ROBAXIN Take 1 tablet (500 mg total) by mouth 2 (two) times daily as needed for muscle spasms.   multivitamin tablet Take 1 tablet by mouth  daily.   nitroGLYCERIN 0.4 MG SL tablet Commonly known as:  NITROSTAT PLACE 1 TABLET UNDER THE TONGUE EVERY 5MINS X 3 DOSES AS NEEDED What changed:  See the new instructions.   pantoprazole 40 MG tablet Commonly known as:  Protonix Take 1 tablet (40 mg total) by mouth 2 (two) times daily.   spironolactone 25 MG tablet Commonly known as:  ALDACTONE TAKE 0.5 TABLETS (12.5 MG TOTAL) BY MOUTH DAILY. What changed:  when to take this   traMADol 50 MG tablet Commonly known as:  Ultram Take 1 tablet (50 mg total) by mouth every 12 (twelve) hours as needed for severe pain.   vitamin C 500 MG tablet Commonly known as:  ASCORBIC ACID Take 500 mg by mouth daily.      No Known Allergies Follow-up Information    [name], MD. Schedule an appointment as soon as possible for a visit in 10 day(s).   Specialty:  Internal Medicine Contact information: [place] [address] [phone]        Satira Sark, MD .   Specialty:  Cardiology Contact information: [place] [address] [phone]           The results of significant diagnostics from this hospitalization (including imaging, microbiology, ancillary and laboratory)  are listed below for reference.    Significant Diagnostic Studies: Dg Lumbar Spine Complete  Result Date: 01/17/2019 CLINICAL DATA:  Low back and right hip pain for the past 2 days. No known injury. EXAM: LUMBAR SPINE - COMPLETE 4+ VIEW COMPARISON:  None. FINDINGS: Five non-rib-bearing lumbar vertebrae. Mild scoliosis. Diffuse osteopenia, making visualization of the bone detail more difficult. Facet degenerative changes throughout the lumbar spine with associated grade 1 retrolisthesis at the L3-4 level. There is an approximately 20% L2 vertebral inferior endplate compression deformity with sclerosis and no visible fracture lines. No pars defects are seen. Atheromatous arterial calcifications. Prominent stool. IMPRESSION: 1.  Old approximately 20% L2 inferior endplate compression fracture. No acute fracture seen. 2. Facet degenerative changes throughout the lumbar spine with associated grade 1 retrolisthesis at the L3-4 level. 3. Prominent stool. Electronically Signed   By: Claudie Revering M.D.   On: 01/17/2019 19:02   Mr Lumbar Spine Wo Contrast  Result Date: 01/17/2019 CLINICAL DATA:  Severe back pain. No known trauma. Osteoporosis. Chronic steroid use. EXAM: MRI LUMBAR SPINE WITHOUT CONTRAST TECHNIQUE: Multiplanar, multisequence MR imaging of the lumbar spine was performed. No intravenous contrast was administered. COMPARISON:  None. FINDINGS: Segmentation: 5 non rib-bearing lumbar type vertebral bodies are present. The lowest fully formed vertebral body is L5. Alignment: Grade 1 anterolisthesis at L4-5 measures 5 mm. There is slight retrolisthesis at T12-L1 and L1-2. Vertebrae: Edematous marrow changes along the inferior aspect of L2 consistent with a subacute fracture. No mass lesion is present. Marrow signal changes do not extend into the pedicles. Hemangioma is present at L5. Chronic edematous endplate marrow changes are present on the right at T12-L1. Conus medullaris and cauda equina: Conus extends to the L1-2 level. Conus and cauda equina appear normal. Paraspinal and other soft tissues: Limited imaging the abdomen is unremarkable. There is no significant adenopathy. No solid organ lesions are present. Disc levels: T12-L1: Negative. L1-2: Facet hypertrophy is present bilaterally. There is uncovering of a mild disc bulge. Central canal is patent. Mild foraminal narrowing is present bilaterally. L2-3: Mild facet hypertrophy is present bilaterally. Foramina are patent bilaterally. L3-4: A broad-based disc protrusion is asymmetric to the left. This results in mild left subarticular and foraminal narrowing. L4-5: There is uncovering of a broad-based disc protrusion. The central canal is patent. Foramina are patent bilaterally.  L5-S1: Chronic loss of disc height is present. No significant stenosis is present. IMPRESSION: 1. Subacute inferior endplate compression fracture at L2 with less than 20% loss of height and no retropulsed bone. 2. Mild foraminal narrowing bilaterally at L1-2. 3. Mild left subarticular and foraminal narrowing at L3-4. 4. Grade 1 anterolisthesis at L4-5 without significant stenosis. Electronically Signed   By: San Morelle M.D.   On: 01/17/2019 21:28   Ct Abdomen Pelvis W Contrast  Result Date: 01/28/2019 CLINICAL DATA:  Abdominal distension, constipation, nausea and vomiting. EXAM: CT ABDOMEN AND PELVIS WITH CONTRAST TECHNIQUE: Multidetector CT imaging of the abdomen and pelvis was performed using the standard protocol following bolus administration of intravenous contrast. CONTRAST:  73mL OMNIPAQUE IOHEXOL 300 MG/ML  SOLN COMPARISON:  None. FINDINGS: Lower chest: There is atelectasis at the right lung base with a trace pleural effusion. Mild atelectasis at the left lung base. Hepatobiliary: No focal liver abnormality is seen. No gallstones, gallbladder wall thickening, or biliary dilatation. Pancreas: Unremarkable. No pancreatic ductal dilatation or surrounding inflammatory changes. Spleen: Normal in size without focal abnormality. Adrenals/Urinary Tract: Adrenal glands are unremarkable. Kidneys are normal, without  renal calculi, focal lesion, or hydronephrosis. Bladder is unremarkable. Stomach/Bowel: The stomach is very distended and fluid-filled without overt wall thickening, visible ulceration or evidence of perforation. There is some relative narrowing at the level of the distal antrum and pyloric region which may be secondary to peristalsis. A component of stenosis/stricture can not be excluded. Small bowel and colon are decompressed without evidence of obstruction, ileus or inflammatory process. No free air identified. No evidence of focal abscess. Vascular/Lymphatic: Atherosclerosis of the  abdominal aorta and common iliac arteries without evidence of aneurysmal disease. No enlarged lymph nodes identified in the abdomen or pelvis. Reproductive: Uterus and bilateral adnexa are unremarkable. Other: No abdominal wall hernia or abnormality. No abdominopelvic ascites. Musculoskeletal: No acute or significant osseous findings. IMPRESSION: Significant gastric distension with fluid-filled stomach. There is no overt ulceration, mass or gastric wall thickening. Relative narrowing at the level of the distal antrum and pyloric region may be secondary to peristalsis or underlying stricture. Electronically Signed   By: Aletta Edouard M.D.   On: 01/28/2019 15:07   Dg Chest Port 1 View  Result Date: 01/29/2019 CLINICAL DATA:  NG placement EXAM: PORTABLE CHEST 1 VIEW COMPARISON:  01/28/2019 FINDINGS: NG tube in the stomach. Cardiac enlargement without heart failure. Mild bibasilar airspace disease unchanged. Possible pneumonia or atelectasis. IMPRESSION: NG in the stomach Mild bibasilar airspace disease unchanged. Electronically Signed   By: Franchot Gallo M.D.   On: 01/29/2019 13:36   Dg Chest Port 1 View  Result Date: 01/28/2019 CLINICAL DATA:  Hypotension. EXAM: PORTABLE CHEST 1 VIEW COMPARISON:  11/25/2013 FINDINGS: Lungs are hypoinflated with mild bibasilar hazy opacification which may be due to atelectasis or overlying prominent soft tissue structures and much less likely infection. Mild cardiomegaly which is new. Coronary stent present over the left heart unchanged. Remainder the exam is unchanged. IMPRESSION: Mild hazy bibasilar opacification which may be due to atelectasis or overlying prominent soft tissues and less likely infection. Cardiomegaly which is new since the prior exam. Electronically Signed   By: Marin Olp M.D.   On: 01/28/2019 13:03   Dg Hip Unilat With Pelvis 2-3 Views Right  Result Date: 01/17/2019 CLINICAL DATA:  Low back pain EXAM: DG HIP (WITH OR WITHOUT PELVIS) 2-3V RIGHT  COMPARISON:  None. FINDINGS: Moderate retained feces at the rectum. Pubic symphysis and rami appear intact. No fracture or malalignment. Vascular calcifications. IMPRESSION: No acute osseous abnormality. Electronically Signed   By: Donavan Foil M.D.   On: 01/17/2019 19:01   Microbiology: Recent Results (from the past 240 hour(s))  Urine culture     Status: None   Collection Time: 01/28/19  3:22 PM  Result Value Ref Range Status   Specimen Description   Final    URINE, RANDOM Performed at [facility], [address], [place], [place] [address]    Special Requests   Final    NONE Performed at [facility], [address], [place], [place] [address]    Culture   Final    NO GROWTH Performed at [facility], [place] [address] [place],  [address]    Report Status 01/30/2019 FINAL  Final  Culture, blood (routine x 2)     Status: None (Preliminary result)   Collection Time: 01/28/19  3:33 PM  Result Value Ref Range Status   Specimen Description BLOOD RIGHT HAND  Final   Special Requests   Final    BOTTLES DRAWN AEROBIC ONLY Blood Culture results may not be optimal due  to an inadequate volume of blood received in culture bottles   Culture   Final    NO GROWTH 3 DAYS Performed at [facility], [address], [place], [place] [address]    Report Status PENDING  Incomplete  Culture, blood (routine x 2)     Status: None (Preliminary result)   Collection Time: 01/28/19  3:50 PM  Result Value Ref Range Status   Specimen Description LEFT ANTECUBITAL  Final   Special Requests   Final    BOTTLES DRAWN AEROBIC AND ANAEROBIC Blood Culture adequate volume   Culture   Final    NO GROWTH 3 DAYS Performed at [facility], [address] [place], [place] [address]    Report Status PENDING  Incomplete     Labs: Basic Metabolic Panel: Recent Labs  Lab 01/28/19 1237 01/29/19 0439 01/30/19 0404  NA 131* 135 139  K 4.2 3.8 3.9  CL 103 112* 116*  CO2 17* 18* 17*  GLUCOSE 129* 89  71  BUN 64* 45* 24*  CREATININE 1.49* 0.71 0.52  CALCIUM 7.6* 7.6* 7.7*   Liver Function Tests: Recent Labs  Lab 01/28/19 1237  AST 30  ALT 19  ALKPHOS 76  BILITOT 0.6  PROT 5.6*  ALBUMIN 2.7*   CBC: Recent Labs  Lab 01/28/19 1237 01/30/19 0404  WBC 14.7* 7.4  NEUTROABS 13.4*  --   HGB 13.5 10.8*  HCT 39.0 31.8*  MCV 95.8 96.7  PLT 195 148*   Signed:  Barton Dubois MD.  [facility] 01/31/2019, 1:34 PM

## 2019-01-31 NOTE — TOC Transition Note (Signed)
Transition of Care [facility]) - CM/SW Discharge Note   Patient Details  Name: Debra Doyle MRN: [id number] Date of Birth: 1929/07/25  [facility]) CM/SW Contact:  Ihor Gully, LCSW Phone Number: 01/31/2019, 3:19 PM   Clinical Narrative:     Patient discharging to [facility] for short term rehab.   Final next level of care: [place] Barriers to Discharge: No Barriers Identified   Patient Goals and CMS Choice Patient states their goals for this hospitalization and ongoing recovery are:: To return home to her baseline state of independence.  CMS Medicare.gov Compare Post Acute Care list provided to:: Patient Choice offered to / list presented to : Patient  Discharge Placement PASRR number recieved: 01/30/19            Patient [facility]: [facility] Patient to be transferred to facility by: [facility] staff Name of family member notified: Hulan Fess and DIL Patient and family notified of of transfer: 01/31/19  Discharge Plan and Services   Discharge Planning Services: CM Consult Post Acute Care Choice: [place] Agency: [place] ([facility]Active with [facility] )   Social Determinants of Health (SDOH) Interventions     Readmission Risk Interventions No flowsheet data found.

## 2019-01-31 NOTE — Progress Notes (Signed)
Physical Therapy Treatment Patient Details Name: Debra Doyle MRN: [id number] DOB: 1929/07/19 Today's Date: 01/31/2019    History of Present Illness Debra Doyle is a 85 y.o. female with medical history significant for hypertension, hyperlipidemia, coronary artery disease, who was brought to the ED with complaints of vomiting over the past 2 days, with barely any oral. intake.  And also reports abdominal distention mostly upper abdomen, with pain.  No loose stools.  No burning with urination.  Family reports a lot of belching over the past few days. No blood in stools, No black stools.  And has been constipated, she was given Dulcolax at home , after which patient had a good bowel movement.    PT Comments    Patient required Max assist to don TLSO brace, demonstrates increased endurance/distance for gait training without loss of balance, labored breathing with tendency breath through mouth requiring verbal cues to breath in through nose with fair carryover, limited secondary to c/o fatigue and tolerated sitting up in chair with family members present at bedside.  Patient will benefit from continued physical therapy in hospital and recommended venue below to increase strength, balance, endurance for safe ADLs and gait.    Follow Up Recommendations  SNF;Supervision/Assistance - 24 hour;Supervision for mobility/OOB     Equipment Recommendations  None recommended by PT    Recommendations for Other Services       Precautions / Restrictions Precautions Precautions: Fall Required Braces or Orthoses: Spinal Brace Spinal Brace: Thoracolumbosacral orthotic Restrictions Weight Bearing Restrictions: No    Mobility  Bed Mobility Overal bed mobility: Needs Assistance Bed Mobility: Supine to Sit     Supine to sit: Min assist     General bed mobility comments: slow labored movement  Transfers Overall transfer level: Needs assistance Equipment used: Rolling walker (2 wheeled) Transfers:  Sit to/from [facility] Sit to Stand: Min assist Stand pivot transfers: Min assist       General transfer comment: unsteady on feet  Ambulation/Gait Ambulation/Gait assistance: Min assist Gait Distance (Feet): 30 Feet Assistive device: Rolling walker (2 wheeled) Gait Pattern/deviations: Decreased step length - right;Decreased step length - left;Decreased stride length Gait velocity: decreased   General Gait Details: increased endurance/distance with slow labored cadence and labored breathing, limited secondary to fatigue   Stairs             Wheelchair Mobility    Modified Rankin (Stroke Patients Only)       Balance Overall balance assessment: Needs assistance Sitting-balance support: Feet supported;No upper extremity supported Sitting balance-Leahy Scale: Fair     Standing balance support: Bilateral upper extremity supported;During functional activity Standing balance-Leahy Scale: Fair Standing balance comment: using RW                            Cognition Arousal/Alertness: Awake/alert Behavior During Therapy: WFL for tasks assessed/performed Overall Cognitive Status: Within Functional Limits for tasks assessed                                        Exercises General Exercises - Lower Extremity Long Arc Quad: Seated;Strengthening;AROM;Both;10 reps Hip Flexion/Marching: Seated;Strengthening;AROM;Both;10 reps Toe Raises: Seated;Strengthening;AROM;Both;10 reps Heel Raises: Seated;AROM;Strengthening;Both;10 reps    General Comments        Pertinent Vitals/Pain Pain Assessment: Faces Faces Pain Scale: Hurts even more Pain Location: low back Pain Descriptors /  Indicators: Sharp;Grimacing;Discomfort Pain Intervention(s): Limited activity within patient's tolerance;Monitored during session;Repositioned    Home Living                      Prior Function            PT Goals (current goals can now  be found in the care plan section) Acute Rehab PT Goals Patient Stated Goal: return home with family to assist PT Goal Formulation: With patient/family Time For Goal Achievement: 02/13/19 Potential to Achieve Goals: Good Progress towards PT goals: Progressing toward goals    Frequency    Min 3X/week      PT Plan Current plan remains appropriate    Co-evaluation              AM-PAC PT "6 Clicks" Mobility   Outcome Measure  Help needed turning from your back to your side while in a flat bed without using bedrails?: A Little Help needed moving from lying on your back to sitting on the side of a flat bed without using bedrails?: A Little Help needed moving to and from a bed to a chair (including a wheelchair)?: A Little Help needed standing up from a chair using your arms (e.g., wheelchair or bedside chair)?: A Little Help needed to walk in hospital room?: A Lot Help needed climbing 3-5 steps with a railing? : A Lot 6 Click Score: 16    End of Session Equipment Utilized During Treatment: Gait belt;Back brace;Other (comment)(TLSO) Activity Tolerance: Patient tolerated treatment well;Patient limited by fatigue Patient left: in chair;with call bell/phone within reach;with family/visitor present Nurse Communication: Mobility status PT Visit Diagnosis: Unsteadiness on feet (R26.81);Other abnormalities of gait and mobility (R26.89);Muscle weakness (generalized) (M62.81)     Time: 3382-5053 PT Time Calculation (min) (ACUTE ONLY): 24 min  Charges:  $Therapeutic Exercise: 8-22 mins $Therapeutic Activity: 8-22 mins                     3:42 PM, 01/31/19 Lonell Grandchild, MPT Physical Therapist with [facility] [phone] office [phone] mobile phone

## 2019-01-31 NOTE — TOC Transition Note (Signed)
Transition of Care [facility]) - CM/SW Discharge Note   Patient Details  Name: BETTEJANE LEAVENS MRN: [id number] Date of Birth: Jul 25, 1929  Transition of Care [facility]) CM/SW Contact:  Ihor Gully, LCSW Phone Number: 01/31/2019, 3:19 PM   Clinical Narrative:     Patient to discharge to [facility] for short term rehab.   Final next level of care: [place] Barriers to Discharge: No Barriers Identified   Patient Goals and CMS Choice Patient states their goals for this hospitalization and ongoing recovery are:: To return home to her baseline state of independence.  CMS Medicare.gov Compare Post Acute Care list provided to:: Patient Choice offered to / list presented to : Patient  Discharge Placement PASRR number recieved: 01/30/19            Patient chooses bed at: [facility] Patient to be transferred to facility by: [facility] staff Name of family member notified: Hulan Fess and DIL Patient and family notified of of transfer: 01/31/19  Discharge Plan and Services   Discharge Planning Services: CM Consult Post Acute Care Choice: [place] Agency: [place] ([facility]Active with [facility] )   Social Determinants of Health (SDOH) Interventions     Readmission Risk Interventions No flowsheet data found.

## 2019-02-02 LAB — CULTURE, BLOOD (ROUTINE X 2)
CULTURE: NO GROWTH
CULTURE: NO GROWTH
Special Requests: ADEQUATE

## 2019-02-03 ENCOUNTER — Other Ambulatory Visit: Payer: Self-pay | Admitting: Adult Health

## 2019-02-03 ENCOUNTER — Non-Acute Institutional Stay (SKILLED_NURSING_FACILITY): Payer: PPO | Admitting: Internal Medicine

## 2019-02-03 ENCOUNTER — Encounter: Payer: Self-pay | Admitting: Internal Medicine

## 2019-02-03 DIAGNOSIS — S32020S Wedge compression fracture of second lumbar vertebra, sequela: Secondary | ICD-10-CM | POA: Diagnosis not present

## 2019-02-03 DIAGNOSIS — E782 Mixed hyperlipidemia: Secondary | ICD-10-CM | POA: Diagnosis not present

## 2019-02-03 DIAGNOSIS — K279 Peptic ulcer, site unspecified, unspecified as acute or chronic, without hemorrhage or perforation: Secondary | ICD-10-CM | POA: Diagnosis not present

## 2019-02-03 DIAGNOSIS — I10 Essential (primary) hypertension: Secondary | ICD-10-CM | POA: Diagnosis not present

## 2019-02-03 DIAGNOSIS — K2100 Gastro-esophageal reflux disease with esophagitis, without bleeding: Secondary | ICD-10-CM

## 2019-02-03 DIAGNOSIS — I255 Ischemic cardiomyopathy: Secondary | ICD-10-CM | POA: Diagnosis not present

## 2019-02-03 MED ORDER — TRAMADOL HCL 50 MG PO TABS: 50.0000 mg | ORAL_TABLET | Freq: Two times a day (BID) | ORAL | 0 refills | Status: DC | PRN

## 2019-02-03 NOTE — Progress Notes (Signed)
Provider:  Veleta Miners, MD Location:   [facility] Room Number: 155 P Place of Service:  SNF ([phone])  PCP: [name], MD Patient Care Team: [name], MD as PCP - General (Internal Medicine) Satira Sark, MD as PCP - Cardiology (Cardiology) Satira Sark, MD as Consulting Physician (Cardiology)  Extended Emergency Contact Information Primary Emergency Contact: Finger,David Address: [address]          [place], [place] [address] Johnnette Litter of [place] Phone: [phone] Mobile Phone: [phone] Relation: Son Secondary Emergency Contact: Finger,Maddie Mobile Phone: [phone] Relation: Relative  Code Status: Full Code Goals of Care: Advanced Directive information Advanced Directives 01/28/2019  Does Patient Have a Medical Advance Directive? No  Type of Advance Directive [place]  Does patient want to make changes to medical advance directive? -  Copy of [place] in Chart? No - copy requested  Would patient like information on creating a medical advance directive? No - Patient declined  Pre-existing out of facility DNR order (yellow form or pink MOST form) -      Chief Complaint  Patient presents with   New Admit To SNF    Admission    HPI: Patient is a 85 y.o. female seen today for admission to SNF for therapy.  Patient was admitted in the hospital from 03/17-03/20 for Duodenal ulcer and Esophagitis. Patient has a history of hypertension, hyperlipidemia, CAD s/p DES in 2012,2015 on Long term Dual Therapy, chemic cardiomyopathy with LVEF of 55%.  Stent L2 compression fracture in 01/17/19 She was unable to give me any detailed history.  The only thing she said that she has severe pain in her back and her abdomen and she was unable to eat and was having nausea due to the pain.  Her family decided to bring her to the emergency room.  Her previous MRI done in 03/06 had shown L2  compression fracture. He had a CT scan done of her abdomen showed gastric distention with fluid-filled stomach with questionable narrowing at the level of pyloric region. She underwent EGD on 03/18 which showed extensive ulceration in GE junction and 2 bulbar ulcers Aspirin was held and she was started on Protonix twice daily Patient is also wearing her TLSO brace due to her compression fracture.  She says it helps her pain Not have any acute problems today.  She says her appetite is still not that good.  No nausea vomiting or abdominal pain  she lives by herself and walking with a cane and was driving short distances.  Has a supportive family which lives nearby.     Past Medical History:  Diagnosis Date   Coronary atherosclerosis of native coronary artery    DES x 4 to LAD 1/12   Essential hypertension, benign    Fibrocystic breast disease    Hx of colonic polyps    Ischemic cardiomyopathy    Myocardial infarction [facility])    NSTEMI 1/12   ST elevation (STEMI) myocardial infarction involving left anterior descending coronary artery [facility])    January 2015 - LAD stent thrombosis off DAPT   Past Surgical History:  Procedure Laterality Date   CATARACT EXTRACTION, BILATERAL     Bilateral   ESOPHAGOGASTRODUODENOSCOPY N/A 01/29/2019   Procedure: ESOPHAGOGASTRODUODENOSCOPY (EGD);  Surgeon: Rogene Houston, MD;  Location: [facility];  Service: Endoscopy;  Laterality: N/A;   LEFT HEART CATHETERIZATION WITH CORONARY ANGIOGRAM N/A 11/25/2013   Procedure: LEFT HEART  CATHETERIZATION WITH CORONARY ANGIOGRAM;  Surgeon: Sinclair Grooms, MD;  Location: [facility] CATH LAB;  Service: Cardiovascular;  Laterality: N/A;   PERCUTANEOUS CORONARY STENT INTERVENTION (PCI-S)  11/25/2013   Procedure: PERCUTANEOUS CORONARY STENT INTERVENTION (PCI-S);  Surgeon: Sinclair Grooms, MD;  Location: [facility] CATH LAB;  Service: Cardiovascular;;    reports that she has never smoked. She has never used smokeless tobacco.  She reports that she does not drink alcohol or use drugs. Social History   Socioeconomic History   Marital status: Widowed    Spouse name: Not on file   Number of children: Not on file   Years of education: Not on file   Highest education level: Not on file  Occupational History   Occupation: Retired    Fish farm manager: RETIRED  Scientist, product/process development strain: Not on file   Food insecurity:    Worry: Not on file    Inability: Not on file   Transportation needs:    Medical: Not on file    Non-medical: Not on file  Tobacco Use   Smoking status: Never Smoker   Smokeless tobacco: Never Used  Substance and Sexual Activity   Alcohol use: No   Drug use: No   Sexual activity: Not on file  Lifestyle   Physical activity:    Days per week: Not on file    Minutes per session: Not on file   Stress: Not on file  Relationships   Social connections:    Talks on phone: Not on file    Gets together: Not on file    Attends religious service: Not on file    Active member of club or organization: Not on file    Attends meetings of clubs or organizations: Not on file    Relationship status: Not on file   Intimate partner violence:    Fear of current or ex partner: Not on file    Emotionally abused: Not on file    Physically abused: Not on file    Forced sexual activity: Not on file  Other Topics Concern   Not on file  Social History Narrative   Not on file    Functional Status Survey:    Family History  Problem Relation Age of Onset   Coronary artery disease Mother    Emphysema Father    Cancer Sister        1 sister/cancer type unknown   Stroke Brother        1 brother    Health Maintenance  Topic Date Due   INFLUENZA VACCINE  03/06/2019 (Originally 06/13/2018)   DEXA SCAN  03/06/2019 (Originally 03/13/1996)   TETANUS/TDAP  03/06/2019 (Originally 03/13/1948)   PNA vac Low Risk Adult (1 of 2 - PCV13) 02/03/2020 (Originally 03/13/1996)    No  Known Allergies  Outpatient Encounter Medications as of 02/03/2019  Medication Sig   acetaminophen (TYLENOL) 325 MG tablet Take 2 tablets (650 mg total) by mouth every 6 (six) hours as needed for fever or headache (pain).   Ascorbic Acid (VITAMIN C) 500 MG tablet Take 500 mg by mouth daily.     [START ON 02/04/2019] aspirin EC 81 MG tablet Take 1 tablet (81 mg total) by mouth daily.   atorvastatin (LIPITOR) 40 MG tablet Take 40 mg by mouth every evening.   carvedilol (COREG) 6.25 MG tablet Take 1 tablet (6.25 mg total) by mouth 2 (two) times daily.   Lidocaine 4 % PTCH Apply 1 patch topically  daily. Apply to back and remove within 12 hours   losartan (COZAAR) 25 MG tablet Give 2 tablets (50 mg) by mouth daily   Magnesium 250 MG TABS Take 1 tablet by mouth daily.   methocarbamol (ROBAXIN) 500 MG tablet Take 1 tablet (500 mg total) by mouth 2 (two) times daily as needed for muscle spasms.   Multiple Vitamin (MULTIVITAMIN) tablet Take 1 tablet by mouth daily.     nitroGLYCERIN (NITROSTAT) 0.4 MG SL tablet Place 0.4 mg under the tongue as needed for chest pain. Place one tablet under the tongue every 5 minutes x 3 doses max as needed for chest pain.  Contact MD or 911 with unrelieved pain   NON FORMULARY Diet Type:  NAS   Omega-3 Fatty Acids (FISH OIL) 1000 MG CAPS Take 1 capsule by mouth daily.    pantoprazole (PROTONIX) 40 MG tablet Take 1 tablet (40 mg total) by mouth 2 (two) times daily.   spironolactone (ALDACTONE) 25 MG tablet Take 12.5 mg by mouth daily.   traMADol (ULTRAM) 50 MG tablet Take 1 tablet (50 mg total) by mouth every 12 (twelve) hours as needed for severe pain.   [DISCONTINUED] atorvastatin (LIPITOR) 40 MG tablet TAKE 1 TABLET (40 MG TOTAL) BY MOUTH DAILY AT 6 PM. (Patient not taking: Reported on 02/03/2019)   [DISCONTINUED] lidocaine (LIDODERM) 5 % Place 1 patch onto the skin daily. Remove & Discard patch within 12 hours or as directed by MD (Patient not taking:  Reported on 02/03/2019)   [DISCONTINUED] losartan (COZAAR) 50 MG tablet TAKE 1 TABLET BY MOUTH EVERY DAY (Patient not taking: No sig reported)   [DISCONTINUED] nitroGLYCERIN (NITROSTAT) 0.4 MG SL tablet PLACE 1 TABLET UNDER THE TONGUE EVERY 5MINS X 3 DOSES AS NEEDED (Patient not taking: No sig reported)   [DISCONTINUED] spironolactone (ALDACTONE) 25 MG tablet TAKE 0.5 TABLETS (12.5 MG TOTAL) BY MOUTH DAILY. (Patient not taking: Reported on 02/03/2019)   No facility-administered encounter medications on file as of 02/03/2019.     Review of Systems  Constitutional: Positive for activity change and appetite change.  HENT: Negative.   Respiratory: Negative for shortness of breath.   Cardiovascular: Negative.   Gastrointestinal: Negative.   Genitourinary: Negative.   Musculoskeletal: Positive for back pain.  Skin: Negative.   Neurological: Positive for weakness.  Psychiatric/Behavioral: Negative.     Vitals:   02/03/19 1007  BP: (!) 150/88  Pulse: 80  Resp: 20  Temp: (!) 96.9 F (36.1 C)  Weight: 163 lb 2 oz (74 kg)  Height: 5\' 6"  (1.676 m)   Body mass index is 26.33 kg/m. Physical Exam Vitals signs reviewed.  Constitutional:      Appearance: Normal appearance.  HENT:     Head: Normocephalic.     Nose: Nose normal.     Mouth/Throat:     Mouth: Mucous membranes are moist.     Pharynx: Oropharynx is clear.  Eyes:     Pupils: Pupils are equal, round, and reactive to light.  Neck:     Musculoskeletal: Neck supple.  Cardiovascular:     Rate and Rhythm: Normal rate and regular rhythm.     Pulses: Normal pulses.     Heart sounds: Normal heart sounds.  Pulmonary:     Effort: Pulmonary effort is normal. No respiratory distress.     Breath sounds: Normal breath sounds. No wheezing or rales.  Abdominal:     General: Abdomen is flat. Bowel sounds are normal. There is no distension.  Palpations: Abdomen is soft.     Tenderness: There is no abdominal tenderness. There is no  guarding.  Musculoskeletal:     Comments: Mild swelling Bilateral  Skin:    General: Skin is warm and dry.  Neurological:     General: No focal deficit present.     Mental Status: She is alert and oriented to person, place, and time.  Psychiatric:        Mood and Affect: Mood normal.        Thought Content: Thought content normal.        Judgment: Judgment normal.     Labs reviewed: Basic Metabolic Panel: Recent Labs    01/28/19 1237 01/29/19 0439 01/30/19 0404  NA 131* 135 139  K 4.2 3.8 3.9  CL 103 112* 116*  CO2 17* 18* 17*  GLUCOSE 129* 89 71  BUN 64* 45* 24*  CREATININE 1.49* 0.71 0.52  CALCIUM 7.6* 7.6* 7.7*   Liver Function Tests: Recent Labs    01/28/19 1237  AST 30  ALT 19  ALKPHOS 76  BILITOT 0.6  PROT 5.6*  ALBUMIN 2.7*   No results for input(s): LIPASE, AMYLASE in the last 8760 hours. No results for input(s): AMMONIA in the last 8760 hours. CBC: Recent Labs    01/28/19 1237 01/30/19 0404  WBC 14.7* 7.4  NEUTROABS 13.4*  --   HGB 13.5 10.8*  HCT 39.0 31.8*  MCV 95.8 96.7  PLT 195 148*   Cardiac Enzymes: No results for input(s): CKTOTAL, CKMB, CKMBINDEX, TROPONINI in the last 8760 hours. BNP: Invalid input(s): POCBNP Lab Results  Component Value Date   HGBA1C 5.0 11/25/2013   Lab Results  Component Value Date   TSH 2.121 11/25/2013   No results found for: VITAMINB12 No results found for: FOLATE No results found for: IRON, TIBC, FERRITIN  Imaging and Procedures obtained prior to SNF admission: Ct Abdomen Pelvis W Contrast  Result Date: 01/28/2019 CLINICAL DATA:  Abdominal distension, constipation, nausea and vomiting. EXAM: CT ABDOMEN AND PELVIS WITH CONTRAST TECHNIQUE: Multidetector CT imaging of the abdomen and pelvis was performed using the standard protocol following bolus administration of intravenous contrast. CONTRAST:  79mL OMNIPAQUE IOHEXOL 300 MG/ML  SOLN COMPARISON:  None. FINDINGS: Lower chest: There is atelectasis at the  right lung base with a trace pleural effusion. Mild atelectasis at the left lung base. Hepatobiliary: No focal liver abnormality is seen. No gallstones, gallbladder wall thickening, or biliary dilatation. Pancreas: Unremarkable. No pancreatic ductal dilatation or surrounding inflammatory changes. Spleen: Normal in size without focal abnormality. Adrenals/Urinary Tract: Adrenal glands are unremarkable. Kidneys are normal, without renal calculi, focal lesion, or hydronephrosis. Bladder is unremarkable. Stomach/Bowel: The stomach is very distended and fluid-filled without overt wall thickening, visible ulceration or evidence of perforation. There is some relative narrowing at the level of the distal antrum and pyloric region which may be secondary to peristalsis. A component of stenosis/stricture can not be excluded. Small bowel and colon are decompressed without evidence of obstruction, ileus or inflammatory process. No free air identified. No evidence of focal abscess. Vascular/Lymphatic: Atherosclerosis of the abdominal aorta and common iliac arteries without evidence of aneurysmal disease. No enlarged lymph nodes identified in the abdomen or pelvis. Reproductive: Uterus and bilateral adnexa are unremarkable. Other: No abdominal wall hernia or abnormality. No abdominopelvic ascites. Musculoskeletal: No acute or significant osseous findings. IMPRESSION: Significant gastric distension with fluid-filled stomach. There is no overt ulceration, mass or gastric wall thickening. Relative narrowing at the level of  the distal antrum and pyloric region may be secondary to peristalsis or underlying stricture. Electronically Signed   By: Aletta Edouard M.D.   On: 01/28/2019 15:07   Dg Chest Port 1 View  Result Date: 01/29/2019 CLINICAL DATA:  NG placement EXAM: PORTABLE CHEST 1 VIEW COMPARISON:  01/28/2019 FINDINGS: NG tube in the stomach. Cardiac enlargement without heart failure. Mild bibasilar airspace disease unchanged.  Possible pneumonia or atelectasis. IMPRESSION: NG in the stomach Mild bibasilar airspace disease unchanged. Electronically Signed   By: Franchot Gallo M.D.   On: 01/29/2019 13:36   Dg Chest Port 1 View  Result Date: 01/28/2019 CLINICAL DATA:  Hypotension. EXAM: PORTABLE CHEST 1 VIEW COMPARISON:  11/25/2013 FINDINGS: Lungs are hypoinflated with mild bibasilar hazy opacification which may be due to atelectasis or overlying prominent soft tissue structures and much less likely infection. Mild cardiomegaly which is new. Coronary stent present over the left heart unchanged. Remainder the exam is unchanged. IMPRESSION: Mild hazy bibasilar opacification which may be due to atelectasis or overlying prominent soft tissues and less likely infection. Cardiomegaly which is new since the prior exam. Electronically Signed   By: Marin Olp M.D.   On: 01/28/2019 13:03    Assessment/Plan PUD (peptic ulcer disease) Status post EGD Continue on PPIs Patient doing well eating no nausea.  Gastroesophageal reflux disease with esophagitis Now on Protonix  Closed compression fracture of second lumbar vertebra Patient states she is better with TLSO brace Pain control with Ultram Working with therapy and is able to walk with walker Acute renal insufficiency Thought to be due to dehydration Creatinine back to normal We will repeat BMP Ischemic cardiomyopathy On Aldactone and Coreg, and losartan Continue low-dose aspirin He follows closely with cardiology  Benign essential HTN Controlled on losartan and Coreg  Mixed hyperlipidemia On Lipitor      Family/ staff Communication:   Labs/tests ordered: CMP and CBC in 1 week Total time spent in this patient care encounter was 45_ minutes; greater than 50% of the visit spent counseling patient, reviewing records , Labs and coordinating care for problems addressed at this encounter.

## 2019-02-04 ENCOUNTER — Telehealth: Payer: Self-pay | Admitting: Orthopedic Surgery

## 2019-02-04 DIAGNOSIS — S32020A Wedge compression fracture of second lumbar vertebra, initial encounter for closed fracture: Secondary | ICD-10-CM

## 2019-02-04 NOTE — Telephone Encounter (Signed)
Call received from patient's designated contact Maddie Finger, ph# [phone]; states patient is at [facility] currently, and that Dr Laural Golden recommended that she may need to see Dr Aline Brochure regarding her compression fracture, to evaluate for "possible cement placed into her spine".  Patient also stated to family how much she trusts Dr Aline Brochure.  I relayed (verified with Amy L) that Dr Aline Brochure does not perform this type of procedure.  Stated I will ask Dr Aline Brochure to review and ask if he can make a recommendation based on chart notes and imaging reports.  Dr Aline Brochure - any advice?

## 2019-02-04 NOTE — Telephone Encounter (Signed)
Hi Debra Doyle I don't see any imaging with a compression fracture ???

## 2019-02-05 NOTE — Telephone Encounter (Signed)
I called patient is in hospital now. Patient is already in brace  She is at [facility] center when she goes home. I made an appointment with Dr Aline Brochure for May

## 2019-02-05 NOTE — Telephone Encounter (Signed)
IMPRESSION: 1. Subacute inferior endplate compression fracture at L2 with less than 20% loss of height and no retropulsed bone. 2. Mild foraminal narrowing bilaterally at L1-2. 3. Mild left subarticular and foraminal narrowing at L3-4. 4. Grade 1 anterolisthesis at L4-5 without significant stenosis.   From MRI in March  Radiologists are not performing any spine procedures until after COVID 19 clears.

## 2019-02-05 NOTE — Telephone Encounter (Signed)
Due to COVID19   rec cash brace see [place] in 6 weeks pending cdc updates

## 2019-02-06 ENCOUNTER — Other Ambulatory Visit: Payer: Self-pay | Admitting: Internal Medicine

## 2019-02-06 MED ORDER — TRAMADOL HCL 50 MG PO TABS: 50.0000 mg | ORAL_TABLET | Freq: Two times a day (BID) | ORAL | 0 refills | Status: DC | PRN

## 2019-02-07 ENCOUNTER — Encounter: Payer: Self-pay | Admitting: Internal Medicine

## 2019-02-07 ENCOUNTER — Non-Acute Institutional Stay (SKILLED_NURSING_FACILITY): Payer: PPO | Admitting: Internal Medicine

## 2019-02-07 DIAGNOSIS — S32020S Wedge compression fracture of second lumbar vertebra, sequela: Secondary | ICD-10-CM

## 2019-02-07 DIAGNOSIS — R600 Localized edema: Secondary | ICD-10-CM

## 2019-02-07 DIAGNOSIS — K279 Peptic ulcer, site unspecified, unspecified as acute or chronic, without hemorrhage or perforation: Secondary | ICD-10-CM | POA: Diagnosis not present

## 2019-02-07 DIAGNOSIS — I255 Ischemic cardiomyopathy: Secondary | ICD-10-CM | POA: Diagnosis not present

## 2019-02-07 NOTE — Progress Notes (Signed)
Location:  [place] Room Number: [place] of Service:  SNF [phone]) Provider:  Veleta Miners, MD  [name], MD  Patient Care Team: [name], MD as PCP - General (Internal Medicine) Satira Sark, MD as PCP - Cardiology (Cardiology) Satira Sark, MD as Consulting Physician (Cardiology)  Extended Emergency Contact Information Primary Emergency Contact: Finger,David Address: [address]          [place], [place] [address] Johnnette Litter of [place] Phone: [phone] Mobile Phone: [phone] Relation: Son Secondary Emergency Contact: Finger,Maddie Mobile Phone: [phone] Relation: Relative  Code Status:  Full Code Goals of care: Advanced Directive information Advanced Directives 02/07/2019  Does Patient Have a Medical Advance Directive? No  Type of Advance Directive -  Does patient want to make changes to medical advance directive? -  Copy of [place] in Chart? -  Would patient like information on creating a medical advance directive? No - Patient declined  Pre-existing out of facility DNR order (yellow form or pink MOST form) -     Chief Complaint  Patient presents with   Acute Visit    Edema    HPI:  Pt is a 85 y.o. female seen today for an acute visit for LE edema  Patient has a history of hypertension, hyperlipidemia, CAD s/p DES in 2012,2015 on Long term Dual Therapy, Ischemic cardiomyopathy with LVEF of 55%.  L2 compression fracture in 01/17/19  Patient was admitted in the hospital from 03/17-03/20 for Duodenal ulcer and Esophagitis. Also has L2 compression fracture on 03/06 and is Wearing TLSO brace Patient noticed increasing swelling in both her legs.  She denies any chest pain shortness of breath coughing or PND.  She says she has never had swelling like this before. Her nausea and vomiting is better she is eating well and is not having any abdominal pain.  Her back pain is controlled with TLSO  brace and Ultram PRN. She is working with therapy but she says because of the swelling it is making her hard to walk. Her weight is up by 5 lbs in the facility Past Medical History:  Diagnosis Date   Coronary atherosclerosis of native coronary artery    DES x 4 to LAD 1/12   Essential hypertension, benign    Fibrocystic breast disease    Hx of colonic polyps    Ischemic cardiomyopathy    Myocardial infarction [facility])    NSTEMI 1/12   ST elevation (STEMI) myocardial infarction involving left anterior descending coronary artery [facility])    January 2015 - LAD stent thrombosis off DAPT   Past Surgical History:  Procedure Laterality Date   CATARACT EXTRACTION, BILATERAL     Bilateral   ESOPHAGOGASTRODUODENOSCOPY N/A 01/29/2019   Procedure: ESOPHAGOGASTRODUODENOSCOPY (EGD);  Surgeon: Rogene Houston, MD;  [facility]: [facility];  Service: Endoscopy;  Laterality: N/A;   LEFT HEART CATHETERIZATION WITH CORONARY ANGIOGRAM N/A 11/25/2013   Procedure: LEFT HEART CATHETERIZATION WITH CORONARY ANGIOGRAM;  Surgeon: Sinclair Grooms, MD;  Location: [facility] CATH LAB;  Service: Cardiovascular;  Laterality: N/A;   PERCUTANEOUS CORONARY STENT INTERVENTION (PCI-S)  11/25/2013   Procedure: PERCUTANEOUS CORONARY STENT INTERVENTION (PCI-S);  Surgeon: Sinclair Grooms, MD;  Location: [facility] CATH LAB;  Service: Cardiovascular;;    No Known Allergies  Outpatient Encounter Medications as of 02/07/2019  Medication Sig   acetaminophen (TYLENOL) 325 MG tablet Take 2 tablets (650 mg total) by mouth every 6 (six) hours as  needed for fever or headache (pain).   Ascorbic Acid (VITAMIN C) 500 MG tablet Take 500 mg by mouth daily.     aspirin EC 81 MG tablet Take 1 tablet (81 mg total) by mouth daily.   atorvastatin (LIPITOR) 40 MG tablet Take 40 mg by mouth every evening.   carvedilol (COREG) 6.25 MG tablet Take 1 tablet (6.25 mg total) by mouth 2 (two) times daily.   Lidocaine 4 % PTCH Apply 1 patch topically  daily. Apply to back and remove within 12 hours   losartan (COZAAR) 25 MG tablet Give 2 tablets (50 mg) by mouth daily   Magnesium 250 MG TABS Take 1 tablet by mouth daily.   methocarbamol (ROBAXIN) 500 MG tablet Take 1 tablet (500 mg total) by mouth 2 (two) times daily as needed for muscle spasms.   Multiple Vitamin (MULTIVITAMIN) tablet Take 1 tablet by mouth daily.     nitroGLYCERIN (NITROSTAT) 0.4 MG SL tablet Place 0.4 mg under the tongue as needed for chest pain. Place one tablet under the tongue every 5 minutes x 3 doses max as needed for chest pain.  Contact MD or 911 with unrelieved pain   NON FORMULARY Diet Type:  NAS   Omega-3 Fatty Acids (FISH OIL) 1000 MG CAPS Take 1 capsule by mouth daily.    pantoprazole (PROTONIX) 40 MG tablet Take 1 tablet (40 mg total) by mouth 2 (two) times daily.   spironolactone (ALDACTONE) 25 MG tablet Take 12.5 mg by mouth daily.   [DISCONTINUED] traMADol (ULTRAM) 50 MG tablet Take 1 tablet (50 mg total) by mouth every 12 (twelve) hours as needed for severe pain. (Patient not taking: Reported on 02/07/2019)   No facility-administered encounter medications on file as of 02/07/2019.     Review of Systems  Constitutional: Positive for unexpected weight change.  HENT: Negative.   Respiratory: Negative.  Negative for cough and shortness of breath.   Cardiovascular: Positive for leg swelling.  Gastrointestinal: Negative.  Negative for nausea and vomiting.  Genitourinary: Negative.   Musculoskeletal: Positive for back pain.  Skin: Negative.   Neurological: Positive for weakness.  Psychiatric/Behavioral: Negative.      There is no immunization history on file for this patient. Pertinent  Health Maintenance Due  Topic Date Due   INFLUENZA VACCINE  03/06/2019 (Originally 06/13/2018)   DEXA SCAN  03/06/2019 (Originally 03/13/1996)   PNA vac Low Risk Adult (1 of 2 - PCV13) 02/03/2020 (Originally 03/13/1996)   No flowsheet data found. Functional  Status Survey:    Vitals:   02/07/19 1254  BP: (!) 150/80  Pulse: 75  Resp: 20  Temp: 97.8 F (36.6 C)  Weight: 156 lb (70.8 kg)  Height: 5\' 1"  (1.549 m)   Body mass index is 29.48 kg/m. Physical Exam Constitutional:      Appearance: Normal appearance.  HENT:     Head: Normocephalic.     Nose: Nose normal.     Mouth/Throat:     Mouth: Mucous membranes are moist.     Pharynx: Oropharynx is clear.  Eyes:     Pupils: Pupils are equal, round, and reactive to light.  Neck:     Musculoskeletal: Neck supple.  Cardiovascular:     Rate and Rhythm: Normal rate and regular rhythm.     Pulses: Normal pulses.     Heart sounds: Normal heart sounds. No murmur.  Pulmonary:     Effort: Pulmonary effort is normal. No respiratory distress.     Breath  sounds: Normal breath sounds. No wheezing or rales.  Abdominal:     General: Abdomen is flat. Bowel sounds are normal. There is no distension.     Palpations: Abdomen is soft.     Tenderness: There is no abdominal tenderness. There is no guarding.  Musculoskeletal:     Comments: Has moderate edema Bilateral  Skin:    General: Skin is warm and dry.  Neurological:     General: No focal deficit present.     Mental Status: She is alert and oriented to person, place, and time.  Psychiatric:        Mood and Affect: Mood normal.        Thought Content: Thought content normal.        Judgment: Judgment normal.     Labs reviewed: Recent Labs    01/28/19 1237 01/29/19 0439 01/30/19 0404  NA 131* 135 139  K 4.2 3.8 3.9  CL 103 112* 116*  CO2 17* 18* 17*  GLUCOSE 129* 89 71  BUN 64* 45* 24*  CREATININE 1.49* 0.71 0.52  CALCIUM 7.6* 7.6* 7.7*   Recent Labs    01/28/19 1237  AST 30  ALT 19  ALKPHOS 76  BILITOT 0.6  PROT 5.6*  ALBUMIN 2.7*   Recent Labs    01/28/19 1237 01/30/19 0404  WBC 14.7* 7.4  NEUTROABS 13.4*  --   HGB 13.5 10.8*  HCT 39.0 31.8*  MCV 95.8 96.7  PLT 195 148*   Lab Results  Component Value Date    TSH 2.121 11/25/2013   Lab Results  Component Value Date   HGBA1C 5.0 11/25/2013   Lab Results  Component Value Date   CHOL 176 11/26/2013   HDL 42 11/26/2013   LDLCALC 114 (H) 11/26/2013   TRIG 99 11/26/2013   CHOLHDL 4.2 11/26/2013    Significant Diagnostic Results in last 30 days:  Dg Lumbar Spine Complete  Result Date: 01/17/2019 CLINICAL DATA:  Low back and right hip pain for the past 2 days. No known injury. EXAM: LUMBAR SPINE - COMPLETE 4+ VIEW COMPARISON:  None. FINDINGS: Five non-rib-bearing lumbar vertebrae. Mild scoliosis. Diffuse osteopenia, making visualization of the bone detail more difficult. Facet degenerative changes throughout the lumbar spine with associated grade 1 retrolisthesis at the L3-4 level. There is an approximately 20% L2 vertebral inferior endplate compression deformity with sclerosis and no visible fracture lines. No pars defects are seen. Atheromatous arterial calcifications. Prominent stool. IMPRESSION: 1. Old approximately 20% L2 inferior endplate compression fracture. No acute fracture seen. 2. Facet degenerative changes throughout the lumbar spine with associated grade 1 retrolisthesis at the L3-4 level. 3. Prominent stool. Electronically Signed   By: Claudie Revering M.D.   On: 01/17/2019 19:02   Mr Lumbar Spine Wo Contrast  Result Date: 01/17/2019 CLINICAL DATA:  Severe back pain. No known trauma. Osteoporosis. Chronic steroid use. EXAM: MRI LUMBAR SPINE WITHOUT CONTRAST TECHNIQUE: Multiplanar, multisequence MR imaging of the lumbar spine was performed. No intravenous contrast was administered. COMPARISON:  None. FINDINGS: Segmentation: 5 non rib-bearing lumbar type vertebral bodies are present. The lowest fully formed vertebral body is L5. Alignment: Grade 1 anterolisthesis at L4-5 measures 5 mm. There is slight retrolisthesis at T12-L1 and L1-2. Vertebrae: Edematous marrow changes along the inferior aspect of L2 consistent with a subacute fracture. No mass  lesion is present. Marrow signal changes do not extend into the pedicles. Hemangioma is present at L5. Chronic edematous endplate marrow changes are present on the right  at T12-L1. Conus medullaris and cauda equina: Conus extends to the L1-2 level. Conus and cauda equina appear normal. Paraspinal and other soft tissues: Limited imaging the abdomen is unremarkable. There is no significant adenopathy. No solid organ lesions are present. Disc levels: T12-L1: Negative. L1-2: Facet hypertrophy is present bilaterally. There is uncovering of a mild disc bulge. Central canal is patent. Mild foraminal narrowing is present bilaterally. L2-3: Mild facet hypertrophy is present bilaterally. Foramina are patent bilaterally. L3-4: A broad-based disc protrusion is asymmetric to the left. This results in mild left subarticular and foraminal narrowing. L4-5: There is uncovering of a broad-based disc protrusion. The central canal is patent. Foramina are patent bilaterally. L5-S1: Chronic loss of disc height is present. No significant stenosis is present. IMPRESSION: 1. Subacute inferior endplate compression fracture at L2 with less than 20% loss of height and no retropulsed bone. 2. Mild foraminal narrowing bilaterally at L1-2. 3. Mild left subarticular and foraminal narrowing at L3-4. 4. Grade 1 anterolisthesis at L4-5 without significant stenosis. Electronically Signed   By: San Morelle M.D.   On: 01/17/2019 21:28   Ct Abdomen Pelvis W Contrast  Result Date: 01/28/2019 CLINICAL DATA:  Abdominal distension, constipation, nausea and vomiting. EXAM: CT ABDOMEN AND PELVIS WITH CONTRAST TECHNIQUE: Multidetector CT imaging of the abdomen and pelvis was performed using the standard protocol following bolus administration of intravenous contrast. CONTRAST:  38mL OMNIPAQUE IOHEXOL 300 MG/ML  SOLN COMPARISON:  None. FINDINGS: Lower chest: There is atelectasis at the right lung base with a trace pleural effusion. Mild atelectasis  at the left lung base. Hepatobiliary: No focal liver abnormality is seen. No gallstones, gallbladder wall thickening, or biliary dilatation. Pancreas: Unremarkable. No pancreatic ductal dilatation or surrounding inflammatory changes. Spleen: Normal in size without focal abnormality. Adrenals/Urinary Tract: Adrenal glands are unremarkable. Kidneys are normal, without renal calculi, focal lesion, or hydronephrosis. Bladder is unremarkable. Stomach/Bowel: The stomach is very distended and fluid-filled without overt wall thickening, visible ulceration or evidence of perforation. There is some relative narrowing at the level of the distal antrum and pyloric region which may be secondary to peristalsis. A component of stenosis/stricture can not be excluded. Small bowel and colon are decompressed without evidence of obstruction, ileus or inflammatory process. No free air identified. No evidence of focal abscess. Vascular/Lymphatic: Atherosclerosis of the abdominal aorta and common iliac arteries without evidence of aneurysmal disease. No enlarged lymph nodes identified in the abdomen or pelvis. Reproductive: Uterus and bilateral adnexa are unremarkable. Other: No abdominal wall hernia or abnormality. No abdominopelvic ascites. Musculoskeletal: No acute or significant osseous findings. IMPRESSION: Significant gastric distension with fluid-filled stomach. There is no overt ulceration, mass or gastric wall thickening. Relative narrowing at the level of the distal antrum and pyloric region may be secondary to peristalsis or underlying stricture. Electronically Signed   By: Aletta Edouard M.D.   On: 01/28/2019 15:07   Dg Chest Port 1 View  Result Date: 01/29/2019 CLINICAL DATA:  NG placement EXAM: PORTABLE CHEST 1 VIEW COMPARISON:  01/28/2019 FINDINGS: NG tube in the stomach. Cardiac enlargement without heart failure. Mild bibasilar airspace disease unchanged. Possible pneumonia or atelectasis. IMPRESSION: NG in the  stomach Mild bibasilar airspace disease unchanged. Electronically Signed   By: Franchot Gallo M.D.   On: 01/29/2019 13:36   Dg Chest Port 1 View  Result Date: 01/28/2019 CLINICAL DATA:  Hypotension. EXAM: PORTABLE CHEST 1 VIEW COMPARISON:  11/25/2013 FINDINGS: Lungs are hypoinflated with mild bibasilar hazy opacification which may be due to  atelectasis or overlying prominent soft tissue structures and much less likely infection. Mild cardiomegaly which is new. Coronary stent present over the left heart unchanged. Remainder the exam is unchanged. IMPRESSION: Mild hazy bibasilar opacification which may be due to atelectasis or overlying prominent soft tissues and less likely infection. Cardiomegaly which is new since the prior exam. Electronically Signed   By: Marin Olp M.D.   On: 01/28/2019 13:03   Dg Hip Unilat With Pelvis 2-3 Views Right  Result Date: 01/17/2019 CLINICAL DATA:  Low back pain EXAM: DG HIP (WITH OR WITHOUT PELVIS) 2-3V RIGHT COMPARISON:  None. FINDINGS: Moderate retained feces at the rectum. Pubic symphysis and rami appear intact. No fracture or malalignment. Vascular calcifications. IMPRESSION: No acute osseous abnormality. Electronically Signed   By: Donavan Foil M.D.   On: 01/17/2019 19:01    Assessment/Plan LE Edema Patient this is a new problem.  She says she has never had edema before. Her weight is up by 5 lbs Will start her on Lasix 40 mg 2 days and then 20 mg qd Repeat BMP in 1 week Will also order Echo as patient has h/o ischemic cardiomyopathy.  To reevaluate EF. Ischemic cardiomyopathy On Aldactone, Coreg and losartan Continue on aspirin We will try to get repeat echo Peptic ulcer disease with esophagitis Symptoms seems to be controlled Patient eating Continue on Protonix Close compression fracture of L2 Her pain is better with TLSO brace Also using Ultram PRN the prescription was renewed Working with therapy Acute renal insufficiency Creatinine was back  to normal We will follow-up BMP and starting on Lasix Essential hypertension Continue on Coreg and losartan Was mildly elevated today Lasix will help Mixed hyperlipidemia On Lipitor  Family/ staff Communication:   Labs/tests ordered:  BMP

## 2019-02-09 DIAGNOSIS — R600 Localized edema: Secondary | ICD-10-CM | POA: Insufficient documentation

## 2019-02-10 ENCOUNTER — Non-Acute Institutional Stay (SKILLED_NURSING_FACILITY): Payer: PPO | Admitting: Internal Medicine

## 2019-02-10 ENCOUNTER — Encounter: Payer: Self-pay | Admitting: Internal Medicine

## 2019-02-10 DIAGNOSIS — I255 Ischemic cardiomyopathy: Secondary | ICD-10-CM

## 2019-02-10 DIAGNOSIS — K279 Peptic ulcer, site unspecified, unspecified as acute or chronic, without hemorrhage or perforation: Secondary | ICD-10-CM | POA: Diagnosis not present

## 2019-02-10 DIAGNOSIS — I10 Essential (primary) hypertension: Secondary | ICD-10-CM | POA: Diagnosis not present

## 2019-02-10 DIAGNOSIS — R600 Localized edema: Secondary | ICD-10-CM

## 2019-02-10 NOTE — Progress Notes (Signed)
Location:  [place] Room Number: [place] of Service:  SNF [phone]) Provider:  Veleta Miners, MD  [name], MD  Patient Care Team: [name], MD as PCP - General (Internal Medicine) Satira Sark, MD as PCP - Cardiology (Cardiology) Satira Sark, MD as Consulting Physician (Cardiology)  Extended Emergency Contact Information Primary Emergency Contact: Finger,David Address: [address]          [place], [place] [address] Johnnette Litter of [place] Phone: [phone] Mobile Phone: [phone] Relation: Son Secondary Emergency Contact: Finger,Maddie Mobile Phone: [phone] Relation: Relative  Code Status:  Full Code Goals of care: Advanced Directive information Advanced Directives 02/10/2019  Does Patient Have a Medical Advance Directive? No  Type of Advance Directive -  Does patient want to make changes to medical advance directive? -  Copy of [place] in Chart? -  Would patient like information on creating a medical advance directive? No - Patient declined  Pre-existing out of facility DNR order (yellow form or pink MOST form) -     Chief Complaint  Patient presents with   Acute Visit    Follow up Edema    HPI:  Pt is a 85 y.o. female seen today for an acute visit for follow up of her LE Patient has a history of hypertension, hyperlipidemia, CADs/p DES in 2012,2015 on Long term Dual Therapy,Ischemic cardiomyopathy with LVEF of 55%. L2 compression fracture in 01/17/19  Patient was admitted in the hospital from 03/17-03/20 for Duodenal ulcer and Esophagitis. Also has L2 compression fracture on 03/06 and is Wearing TLSO brace She was seen on Fri for Increased edema in her Legs and weight gain of 5-6 lbs in facility. She was started on 40 mg of lasix . She has done well and her swelling is down but continues to have moderate edema in Both LE. Her 2 D echo is pending. Possible be done today. She has lost  almost 5 lbs.  Her repeat BMP is pending She is doing better with therapy and is walking with the walker.Her back painis controled with TLSO Brace and she is eating well.   Past Medical History:  Diagnosis Date   Coronary atherosclerosis of native coronary artery    DES x 4 to LAD 1/12   Essential hypertension, benign    Fibrocystic breast disease    Hx of colonic polyps    Ischemic cardiomyopathy    Myocardial infarction [facility])    NSTEMI 1/12   ST elevation (STEMI) myocardial infarction involving left anterior descending coronary artery [facility])    January 2015 - LAD stent thrombosis off DAPT   Past Surgical History:  Procedure Laterality Date   CATARACT EXTRACTION, BILATERAL     Bilateral   ESOPHAGOGASTRODUODENOSCOPY N/A 01/29/2019   Procedure: ESOPHAGOGASTRODUODENOSCOPY (EGD);  Surgeon: Rogene Houston, MD;  Location[facility];  Service: Endoscopy;  Laterality: N/A;   LEFT HEART CATHETERIZATION WITH CORONARY ANGIOGRAM N/A 11/25/2013   Procedure: LEFT HEART CATHETERIZATION WITH CORONARY ANGIOGRAM;  Surgeon: Sinclair Grooms, MD;  Location: [facility] CATH LAB;  Service: Cardiovascular;  Laterality: N/A;   PERCUTANEOUS CORONARY STENT INTERVENTION (PCI-S)  11/25/2013   Procedure: PERCUTANEOUS CORONARY STENT INTERVENTION (PCI-S);  Surgeon: Sinclair Grooms, MD;  Location: [facility] CATH LAB;  Service: Cardiovascular;;    No Known Allergies  Outpatient Encounter Medications as of 02/10/2019  Medication Sig   acetaminophen (TYLENOL) 325 MG tablet Take 2 tablets (650 mg total) by mouth  every 6 (six) hours as needed for fever or headache (pain).   Ascorbic Acid (VITAMIN C) 500 MG tablet Take 500 mg by mouth daily.     aspirin EC 81 MG tablet Take 1 tablet (81 mg total) by mouth daily.   atorvastatin (LIPITOR) 40 MG tablet Take 40 mg by mouth every evening.   carvedilol (COREG) 6.25 MG tablet Take 1 tablet (6.25 mg total) by mouth 2 (two) times daily.   [START ON 02/11/2019]  furosemide (LASIX) 20 MG tablet Take 20 mg by mouth daily.   ketoconazole (NIZORAL) 2 % cream Apply 1 application topically 2 (two) times daily. X 14 days - Apply to area under breast until healed   Lidocaine 4 % PTCH Apply 1 patch topically daily. Apply to back and remove within 12 hours   losartan (COZAAR) 25 MG tablet Give 2 tablets (50 mg) by mouth daily   Magnesium 250 MG TABS Take 1 tablet by mouth daily.   methocarbamol (ROBAXIN) 500 MG tablet Take 1 tablet (500 mg total) by mouth 2 (two) times daily as needed for muscle spasms.   Multiple Vitamin (MULTIVITAMIN) tablet Take 1 tablet by mouth daily.     nitroGLYCERIN (NITROSTAT) 0.4 MG SL tablet Place 0.4 mg under the tongue as needed for chest pain. Place one tablet under the tongue every 5 minutes x 3 doses max as needed for chest pain.  Contact MD or 911 with unrelieved pain   NON FORMULARY Diet Type:  NAS   Omega-3 Fatty Acids (FISH OIL) 1000 MG CAPS Take 1 capsule by mouth daily.    pantoprazole (PROTONIX) 40 MG tablet Take 1 tablet (40 mg total) by mouth 2 (two) times daily.   potassium chloride SA (K-DUR,KLOR-CON) 20 MEQ tablet Take 20 mEq by mouth daily.   spironolactone (ALDACTONE) 25 MG tablet Take 12.5 mg by mouth daily.   No facility-administered encounter medications on file as of 02/10/2019.     Review of Systems  Constitutional: Positive for unexpected weight change.  HENT: Negative.   Respiratory: Negative.   Cardiovascular: Positive for leg swelling.  Gastrointestinal: Negative.   Genitourinary: Negative.   Musculoskeletal: Positive for back pain and myalgias.  Skin: Negative.   Neurological: Positive for weakness.  Psychiatric/Behavioral: Negative.   All other systems reviewed and are negative.    There is no immunization history on file for this patient. Pertinent  Health Maintenance Due  Topic Date Due   INFLUENZA VACCINE  03/06/2019 (Originally 06/13/2018)   DEXA SCAN  03/06/2019 (Originally  03/13/1996)   PNA vac Low Risk Adult (1 of 2 - PCV13) 02/03/2020 (Originally 03/13/1996)   No flowsheet data found. Functional Status Survey:    Vitals:   02/10/19 0931  BP: (!) 157/82  Pulse: 83  Resp: 18  Temp: (!) 97.4 F (36.3 C)  Weight: 156 lb (70.8 kg)  Height: 5\' 1"  (1.549 m)   Body mass index is 29.48 kg/m. Physical Exam Constitutional:      Appearance: Normal appearance.  HENT:     Head: Normocephalic.     Nose: Nose normal.     Mouth/Throat:     Mouth: Mucous membranes are moist.     Pharynx: Oropharynx is clear.  Eyes:     Pupils: Pupils are equal, round, and reactive to light.  Neck:     Musculoskeletal: Neck supple.  Cardiovascular:     Rate and Rhythm: Normal rate and regular rhythm.     Pulses: Normal pulses.  Heart sounds: Normal heart sounds. No murmur.  Pulmonary:     Effort: Pulmonary effort is normal. No respiratory distress.     Breath sounds: Normal breath sounds. No wheezing or rales.  Abdominal:     General: Abdomen is flat. Bowel sounds are normal. There is no distension.     Palpations: Abdomen is soft.     Tenderness: There is no abdominal tenderness. There is no guarding.  Musculoskeletal:     Comments: Has moderate edema Bilateral  Skin:    General: Skin is warm and dry.  Neurological:     General: No focal deficit present.     Mental Status: She is alert and oriented to person, place, and time.  Psychiatric:        Mood and Affect: Mood normal.        Thought Content: Thought content normal.        Judgment: Judgment normal.     Labs reviewed: Recent Labs    01/28/19 1237 01/29/19 0439 01/30/19 0404  NA 131* 135 139  K 4.2 3.8 3.9  CL 103 112* 116*  CO2 17* 18* 17*  GLUCOSE 129* 89 71  BUN 64* 45* 24*  CREATININE 1.49* 0.71 0.52  CALCIUM 7.6* 7.6* 7.7*   Recent Labs    01/28/19 1237  AST 30  ALT 19  ALKPHOS 76  BILITOT 0.6  PROT 5.6*  ALBUMIN 2.7*   Recent Labs    01/28/19 1237 01/30/19 0404  WBC  14.7* 7.4  NEUTROABS 13.4*  --   HGB 13.5 10.8*  HCT 39.0 31.8*  MCV 95.8 96.7  PLT 195 148*   Lab Results  Component Value Date   TSH 2.121 11/25/2013   Lab Results  Component Value Date   HGBA1C 5.0 11/25/2013   Lab Results  Component Value Date   CHOL 176 11/26/2013   HDL 42 11/26/2013   LDLCALC 114 (H) 11/26/2013   TRIG 99 11/26/2013   CHOLHDL 4.2 11/26/2013    Significant Diagnostic Results in last 30 days:  Dg Lumbar Spine Complete  Result Date: 01/17/2019 CLINICAL DATA:  Low back and right hip pain for the past 2 days. No known injury. EXAM: LUMBAR SPINE - COMPLETE 4+ VIEW COMPARISON:  None. FINDINGS: Five non-rib-bearing lumbar vertebrae. Mild scoliosis. Diffuse osteopenia, making visualization of the bone detail more difficult. Facet degenerative changes throughout the lumbar spine with associated grade 1 retrolisthesis at the L3-4 level. There is an approximately 20% L2 vertebral inferior endplate compression deformity with sclerosis and no visible fracture lines. No pars defects are seen. Atheromatous arterial calcifications. Prominent stool. IMPRESSION: 1. Old approximately 20% L2 inferior endplate compression fracture. No acute fracture seen. 2. Facet degenerative changes throughout the lumbar spine with associated grade 1 retrolisthesis at the L3-4 level. 3. Prominent stool. Electronically Signed   By: Claudie Revering M.D.   On: 01/17/2019 19:02   Mr Lumbar Spine Wo Contrast  Result Date: 01/17/2019 CLINICAL DATA:  Severe back pain. No known trauma. Osteoporosis. Chronic steroid use. EXAM: MRI LUMBAR SPINE WITHOUT CONTRAST TECHNIQUE: Multiplanar, multisequence MR imaging of the lumbar spine was performed. No intravenous contrast was administered. COMPARISON:  None. FINDINGS: Segmentation: 5 non rib-bearing lumbar type vertebral bodies are present. The lowest fully formed vertebral body is L5. Alignment: Grade 1 anterolisthesis at L4-5 measures 5 mm. There is slight  retrolisthesis at T12-L1 and L1-2. Vertebrae: Edematous marrow changes along the inferior aspect of L2 consistent with a subacute fracture. No mass lesion  is present. Marrow signal changes do not extend into the pedicles. Hemangioma is present at L5. Chronic edematous endplate marrow changes are present on the right at T12-L1. Conus medullaris and cauda equina: Conus extends to the L1-2 level. Conus and cauda equina appear normal. Paraspinal and other soft tissues: Limited imaging the abdomen is unremarkable. There is no significant adenopathy. No solid organ lesions are present. Disc levels: T12-L1: Negative. L1-2: Facet hypertrophy is present bilaterally. There is uncovering of a mild disc bulge. Central canal is patent. Mild foraminal narrowing is present bilaterally. L2-3: Mild facet hypertrophy is present bilaterally. Foramina are patent bilaterally. L3-4: A broad-based disc protrusion is asymmetric to the left. This results in mild left subarticular and foraminal narrowing. L4-5: There is uncovering of a broad-based disc protrusion. The central canal is patent. Foramina are patent bilaterally. L5-S1: Chronic loss of disc height is present. No significant stenosis is present. IMPRESSION: 1. Subacute inferior endplate compression fracture at L2 with less than 20% loss of height and no retropulsed bone. 2. Mild foraminal narrowing bilaterally at L1-2. 3. Mild left subarticular and foraminal narrowing at L3-4. 4. Grade 1 anterolisthesis at L4-5 without significant stenosis. Electronically Signed   By: San Morelle M.D.   On: 01/17/2019 21:28   Ct Abdomen Pelvis W Contrast  Result Date: 01/28/2019 CLINICAL DATA:  Abdominal distension, constipation, nausea and vomiting. EXAM: CT ABDOMEN AND PELVIS WITH CONTRAST TECHNIQUE: Multidetector CT imaging of the abdomen and pelvis was performed using the standard protocol following bolus administration of intravenous contrast. CONTRAST:  19mL OMNIPAQUE IOHEXOL  300 MG/ML  SOLN COMPARISON:  None. FINDINGS: Lower chest: There is atelectasis at the right lung base with a trace pleural effusion. Mild atelectasis at the left lung base. Hepatobiliary: No focal liver abnormality is seen. No gallstones, gallbladder wall thickening, or biliary dilatation. Pancreas: Unremarkable. No pancreatic ductal dilatation or surrounding inflammatory changes. Spleen: Normal in size without focal abnormality. Adrenals/Urinary Tract: Adrenal glands are unremarkable. Kidneys are normal, without renal calculi, focal lesion, or hydronephrosis. Bladder is unremarkable. Stomach/Bowel: The stomach is very distended and fluid-filled without overt wall thickening, visible ulceration or evidence of perforation. There is some relative narrowing at the level of the distal antrum and pyloric region which may be secondary to peristalsis. A component of stenosis/stricture can not be excluded. Small bowel and colon are decompressed without evidence of obstruction, ileus or inflammatory process. No free air identified. No evidence of focal abscess. Vascular/Lymphatic: Atherosclerosis of the abdominal aorta and common iliac arteries without evidence of aneurysmal disease. No enlarged lymph nodes identified in the abdomen or pelvis. Reproductive: Uterus and bilateral adnexa are unremarkable. Other: No abdominal wall hernia or abnormality. No abdominopelvic ascites. Musculoskeletal: No acute or significant osseous findings. IMPRESSION: Significant gastric distension with fluid-filled stomach. There is no overt ulceration, mass or gastric wall thickening. Relative narrowing at the level of the distal antrum and pyloric region may be secondary to peristalsis or underlying stricture. Electronically Signed   By: Aletta Edouard M.D.   On: 01/28/2019 15:07   Dg Chest Port 1 View  Result Date: 01/29/2019 CLINICAL DATA:  NG placement EXAM: PORTABLE CHEST 1 VIEW COMPARISON:  01/28/2019 FINDINGS: NG tube in the stomach.  Cardiac enlargement without heart failure. Mild bibasilar airspace disease unchanged. Possible pneumonia or atelectasis. IMPRESSION: NG in the stomach Mild bibasilar airspace disease unchanged. Electronically Signed   By: Franchot Gallo M.D.   On: 01/29/2019 13:36   Dg Chest Port 1 View  Result Date: 01/28/2019  CLINICAL DATA:  Hypotension. EXAM: PORTABLE CHEST 1 VIEW COMPARISON:  11/25/2013 FINDINGS: Lungs are hypoinflated with mild bibasilar hazy opacification which may be due to atelectasis or overlying prominent soft tissue structures and much less likely infection. Mild cardiomegaly which is new. Coronary stent present over the left heart unchanged. Remainder the exam is unchanged. IMPRESSION: Mild hazy bibasilar opacification which may be due to atelectasis or overlying prominent soft tissues and less likely infection. Cardiomegaly which is new since the prior exam. Electronically Signed   By: Marin Olp M.D.   On: 01/28/2019 13:03   Dg Hip Unilat With Pelvis 2-3 Views Right  Result Date: 01/17/2019 CLINICAL DATA:  Low back pain EXAM: DG HIP (WITH OR WITHOUT PELVIS) 2-3V RIGHT COMPARISON:  None. FINDINGS: Moderate retained feces at the rectum. Pubic symphysis and rami appear intact. No fracture or malalignment. Vascular calcifications. IMPRESSION: No acute osseous abnormality. Electronically Signed   By: Donavan Foil M.D.   On: 01/17/2019 19:01    Assessment/Plan LE Edema Patient this is a new problem.  She says she has never had edema before. Continue her on Lasix 40 mg QD Repeat  Echo is pending as patient has h/o ischemic cardiomyopathy.  To reevaluate EF. Ischemic cardiomyopathy Continue on Losartan , Aldactone and Coreg Also on Aspirin Repeat Echo Pending  Peptic ulcer disease with esophagitis Symptoms seems to be controlled Patient eating Continue on Protonix Follow with GI Close compression fracture of L2 Her pain is better with TLSO brace Also using Ultram PRN the  prescription was renewed Working with therapy Acute renal insufficiency Creatinine was back to normal We will follow-up BMP and starting on Lasix Essential hypertension Continue on Coreg and losartan Stays elevated mildily Will Continue to montior  Mixed hyperlipidemia On Lipitor     Family/ staff Communication:   Labs/tests ordered:  BMP Total time spent in this patient care encounter was _25 minutes; greater than 50% of the visit spent counseling patient, reviewing records , Labs and coordinating care for problems addressed at this encounter.

## 2019-02-12 ENCOUNTER — Other Ambulatory Visit: Payer: Self-pay | Admitting: *Deleted

## 2019-02-12 ENCOUNTER — Encounter (HOSPITAL_COMMUNITY)
Admission: RE | Admit: 2019-02-12 | Discharge: 2019-02-12 | Disposition: A | Discharge status: Home / Self Care | Payer: PPO | Source: Skilled Nursing Facility | Attending: Internal Medicine | Admitting: Internal Medicine

## 2019-02-12 DIAGNOSIS — M6281 Muscle weakness (generalized): Secondary | ICD-10-CM | POA: Insufficient documentation

## 2019-02-12 DIAGNOSIS — M4846XD Fatigue fracture of vertebra, lumbar region, subsequent encounter for fracture with routine healing: Secondary | ICD-10-CM | POA: Insufficient documentation

## 2019-02-12 DIAGNOSIS — M549 Dorsalgia, unspecified: Secondary | ICD-10-CM | POA: Insufficient documentation

## 2019-02-12 DIAGNOSIS — R262 Difficulty in walking, not elsewhere classified: Secondary | ICD-10-CM | POA: Insufficient documentation

## 2019-02-12 LAB — BASIC METABOLIC PANEL
Anion gap: 9 (ref 5–15)
BUN: 15 mg/dL (ref 8–23)
CO2: 28 mmol/L (ref 22–32)
Calcium: 8.4 mg/dL — ABNORMAL LOW (ref 8.9–10.3)
Chloride: 97 mmol/L — ABNORMAL LOW (ref 98–111)
Creatinine, Ser: 0.88 mg/dL (ref 0.44–1.00)
GFR calc Af Amer: >60 mL/min (ref >60)
GFR calc non Af Amer: 59 mL/min — ABNORMAL LOW (ref >60)
Glucose, Bld: 123 mg/dL — ABNORMAL HIGH (ref 70–99)
Potassium: 3.8 mmol/L (ref 3.5–5.1)
Sodium: 134 mmol/L — ABNORMAL LOW (ref 135–145)

## 2019-02-12 LAB — CBC WITH DIFFERENTIAL/PLATELET
Abs Immature Granulocytes: 0.02 10*3/uL (ref 0.00–0.07)
Basophils Absolute: 0 10*3/uL (ref 0.0–0.1)
Basophils Relative: 0 %
Eosinophils Absolute: 0.2 10*3/uL (ref 0.0–0.5)
Eosinophils Relative: 3 %
HCT: 36.1 % (ref 36.0–46.0)
Hemoglobin: 12.3 g/dL (ref 12.0–15.0)
Immature Granulocytes: 0 %
Lymphocytes Relative: 14 %
Lymphs Abs: 0.9 10*3/uL (ref 0.7–4.0)
MCH: 32.9 pg (ref 26.0–34.0)
MCHC: 34.1 g/dL (ref 30.0–36.0)
MCV: 96.5 fL (ref 80.0–100.0)
Monocytes Absolute: 0.5 10*3/uL (ref 0.1–1.0)
Monocytes Relative: 7 %
Neutro Abs: 5 10*3/uL (ref 1.7–7.7)
Neutrophils Relative %: 76 %
Platelets: 228 10*3/uL (ref 150–400)
RBC: 3.74 MIL/uL — ABNORMAL LOW (ref 3.87–5.11)
RDW: 14.4 % (ref 11.5–15.5)
WBC: 6.6 10*3/uL (ref 4.0–10.5)
nRBC: 0 % (ref 0.0–0.2)

## 2019-02-12 NOTE — Patient Outreach (Signed)
[facility]) Care Management  02/12/2019  Debra Doyle 1929/07/08 [id number]  Update from [facility] after IDT meeting with facility.  Patient anxious to get home. She does have a son next door that provides some support.  Patient was previously active with [facility] CM but recently closed.   Plan to place a referral [facility] for  transition of care calls, patient will discharge home 02/14/19.  Royetta Crochet. Laymond Purser, MSN, RN, [employer] , [place] [phone]) Business Cell  [phone]) Toll Free Office

## 2019-02-13 ENCOUNTER — Encounter: Payer: Self-pay | Admitting: Internal Medicine

## 2019-02-13 ENCOUNTER — Non-Acute Institutional Stay (SKILLED_NURSING_FACILITY): Payer: PPO | Admitting: Internal Medicine

## 2019-02-13 DIAGNOSIS — K279 Peptic ulcer, site unspecified, unspecified as acute or chronic, without hemorrhage or perforation: Secondary | ICD-10-CM

## 2019-02-13 DIAGNOSIS — R600 Localized edema: Secondary | ICD-10-CM | POA: Diagnosis not present

## 2019-02-13 DIAGNOSIS — I255 Ischemic cardiomyopathy: Secondary | ICD-10-CM | POA: Diagnosis not present

## 2019-02-13 DIAGNOSIS — E782 Mixed hyperlipidemia: Secondary | ICD-10-CM | POA: Diagnosis not present

## 2019-02-13 DIAGNOSIS — Z23 Encounter for immunization: Secondary | ICD-10-CM | POA: Diagnosis not present

## 2019-02-13 NOTE — Progress Notes (Signed)
Location:  [place] [facility] Room Number: [place] of Service:  SNF [phone])  Provider: Veleta Miners, MD  PCP: [name], MD Patient Care Team: [name], MD as PCP - General (Internal Medicine) Satira Sark, MD as PCP - Cardiology (Cardiology) Satira Sark, MD as Consulting Physician (Cardiology) Neldon Labella, RN as Registered Nurse  Extended Emergency Contact Information Primary Emergency Contact: Finger,David Address: [address]          [place], [place] [address] Johnnette Litter of [place] Phone: [phone] Mobile Phone: [phone] Relation: Son Secondary Emergency Contact: Finger,Maddie Mobile Phone: [phone] Relation: Relative  Code Status: Full Code Goals of care:  Advanced Directive information Advanced Directives 02/10/2019  Does Patient Have a Medical Advance Directive? No  Type of Advance Directive -  Does patient want to make changes to medical advance directive? -  Copy of [place] in Chart? -  Would patient like information on creating a medical advance directive? No - Patient declined  Pre-existing out of facility DNR order (yellow form or pink MOST form) -     No Known Allergies  Chief Complaint  Patient presents with   Discharge Note    Discharging to home 02/14/2019    HPI:  85 y.o. female  Today for discharge from the facility Patient was admitted in the hospital from 03/17-03/20 for Duodenal ulcer and Esophagitis.  Patient has a history of hypertension, hyperlipidemia, CADs/p DES in 2012,2015 on Long term Dual Therapy,Ischemiccardiomyopathy with LVEF of 55%. L2 compression fracture in 01/17/19  Patient had  severe pain in her back and her abdomen and she was unable to eat and was having nausea due to the pain.  Her family decided to bring her to the emergency room.  Her previous MRI done in 03/06 had shown L2 compression fracture. He had a CT scan done of her abdomen showed  gastric distention with fluid-filled stomach with questionable narrowing at the level of pyloric region. She underwent EGD on 03/18 which showed extensive ulceration in GE junction and 2 bulbar ulcers Aspirin was held and she was started on Protonix twice daily Patient is also wearing her TLSO brace due to her compression fracture.  She says it helps her pain Patient did well in facility with therapy. She did well in the facility.  Her appetite improved.  She denied any further nausea vomiting or abdominal pain. She did develop bilateral lower extremity swelling.  Per patient this was new.  She had a 2D echo done in the facility which showed the EF of 60% with no wall motion abnormality but had diastolic dysfunction. She was started on Lasix and her swelling improved and she lost almost 5 pounds. Patient is walking with a walker almost 25 to 50 feet and is going home with support of her son.   Past Medical History:  Diagnosis Date   Coronary atherosclerosis of native coronary artery    DES x 4 to LAD 1/12   Essential hypertension, benign    Fibrocystic breast disease    Hx of colonic polyps    Ischemic cardiomyopathy    Myocardial infarction [facility])    NSTEMI 1/12   ST elevation (STEMI) myocardial infarction involving left anterior descending coronary artery [facility])    January 2015 - LAD stent thrombosis off DAPT    Past Surgical History:  Procedure Laterality Date   CATARACT EXTRACTION, BILATERAL     Bilateral   ESOPHAGOGASTRODUODENOSCOPY N/A 01/29/2019   Procedure:  ESOPHAGOGASTRODUODENOSCOPY (EGD);  Surgeon: Rogene Houston, MD;  [facility];  Service: Endoscopy;  Laterality: N/A;   LEFT HEART CATHETERIZATION WITH CORONARY ANGIOGRAM N/A 11/25/2013   Procedure: LEFT HEART CATHETERIZATION WITH CORONARY ANGIOGRAM;  Surgeon: Sinclair Grooms, MD;  Location: [facility] CATH LAB;  Service: Cardiovascular;  Laterality: N/A;   PERCUTANEOUS CORONARY STENT INTERVENTION (PCI-S)   11/25/2013   Procedure: PERCUTANEOUS CORONARY STENT INTERVENTION (PCI-S);  Surgeon: Sinclair Grooms, MD;  Location: [facility] CATH LAB;  Service: Cardiovascular;;      reports that she has never smoked. She has never used smokeless tobacco. She reports that she does not drink alcohol or use drugs. Social History   Socioeconomic History   Marital status: Widowed    Spouse name: Not on file   Number of children: Not on file   Years of education: Not on file   Highest education level: Not on file  Occupational History   Occupation: Retired    Fish farm manager: RETIRED  Scientist, product/process development strain: Not on file   Food insecurity:    Worry: Not on file    Inability: Not on file   Transportation needs:    Medical: Not on file    Non-medical: Not on file  Tobacco Use   Smoking status: Never Smoker   Smokeless tobacco: Never Used  Substance and Sexual Activity   Alcohol use: No   Drug use: No   Sexual activity: Not on file  Lifestyle   Physical activity:    Days per week: Not on file    Minutes per session: Not on file   Stress: Not on file  Relationships   Social connections:    Talks on phone: Not on file    Gets together: Not on file    Attends religious service: Not on file    Active member of club or organization: Not on file    Attends meetings of clubs or organizations: Not on file    Relationship status: Not on file   Intimate partner violence:    Fear of current or ex partner: Not on file    Emotionally abused: Not on file    Physically abused: Not on file    Forced sexual activity: Not on file  Other Topics Concern   Not on file  Social History Narrative   Not on file   Functional Status Survey:    No Known Allergies  Pertinent  Health Maintenance Due  Topic Date Due   DEXA SCAN  03/06/2019 (Originally 03/13/1996)   PNA vac Low Risk Adult (1 of 2 - PCV13) 02/03/2020 (Originally 03/13/1996)   INFLUENZA VACCINE  06/14/2019     Medications: Outpatient Encounter Medications as of 02/13/2019  Medication Sig   acetaminophen (TYLENOL) 325 MG tablet Take 2 tablets (650 mg total) by mouth every 6 (six) hours as needed for fever or headache (pain).   Ascorbic Acid (VITAMIN C) 500 MG tablet Take 500 mg by mouth daily.     aspirin EC 81 MG tablet Take 1 tablet (81 mg total) by mouth daily.   atorvastatin (LIPITOR) 40 MG tablet Take 40 mg by mouth every evening.   carvedilol (COREG) 6.25 MG tablet Take 1 tablet (6.25 mg total) by mouth 2 (two) times daily.   furosemide (LASIX) 40 MG tablet Take 40 mg by mouth daily.    ketoconazole (NIZORAL) 2 % cream Apply 1 application topically 2 (two) times daily. X 14 days - Apply  to area under breast until healed   Lidocaine 4 % PTCH Apply 1 patch topically daily. Apply to back and remove within 12 hours   losartan (COZAAR) 25 MG tablet Give 2 tablets (50 mg) by mouth daily   Magnesium 250 MG TABS Take 1 tablet by mouth daily.   methocarbamol (ROBAXIN) 500 MG tablet Take 1 tablet (500 mg total) by mouth 2 (two) times daily as needed for muscle spasms.   Multiple Vitamin (MULTIVITAMIN) tablet Take 1 tablet by mouth daily.     nitroGLYCERIN (NITROSTAT) 0.4 MG SL tablet Place 0.4 mg under the tongue as needed for chest pain. Place one tablet under the tongue every 5 minutes x 3 doses max as needed for chest pain.  Contact MD or 911 with unrelieved pain   NON FORMULARY Diet Type:  NAS   Omega-3 Fatty Acids (FISH OIL) 1000 MG CAPS Take 1 capsule by mouth daily.    pantoprazole (PROTONIX) 40 MG tablet Take 1 tablet (40 mg total) by mouth 2 (two) times daily.   spironolactone (ALDACTONE) 25 MG tablet Take 12.5 mg by mouth daily.   potassium chloride SA (K-DUR,KLOR-CON) 20 MEQ tablet Take 20 mEq by mouth daily.   No facility-administered encounter medications on file as of 02/13/2019.     Review of Systems  Vitals:   02/13/19 1022  BP: 117/84  Pulse: 85  Resp: 20   Temp: (!) 97.3 F (36.3 C)  Weight: 148 lb (67.1 kg)  Height: 5\' 1"  (1.549 m)   Body mass index is 27.96 kg/m. Physical Exam Constitutional:      Appearance: Normal appearance.  HENT:     Head: Normocephalic.     Nose: Nose normal.     Mouth/Throat:     Mouth: Mucous membranes are moist.     Pharynx: Oropharynx is clear.  Eyes:     Pupils: Pupils are equal, round, and reactive to light.  Neck:     Musculoskeletal: Neck supple.  Cardiovascular:     Rate and Rhythm: Normal rate and regular rhythm.     Pulses: Normal pulses.     Heart sounds: Normal heart sounds. No murmur.  Pulmonary:     Effort: Pulmonary effort is normal. No respiratory distress.     Breath sounds: Normal breath sounds. No wheezing or rales.  Abdominal:     General: Abdomen is flat. Bowel sounds are normal. There is no distension.     Palpations: Abdomen is soft.     Tenderness: There is no abdominal tenderness. There is no guarding.  Musculoskeletal:     Comments: Has moderate edema Bilateral but improved then before  Skin:    General: Skin is warm and dry.  Neurological:     General: No focal deficit present.     Mental Status: She is alert and oriented to person, place, and time.  Psychiatric:        Mood and Affect: Mood normal.        Thought Content: Thought content normal.        Judgment: Judgment normal.     Labs reviewed: Basic Metabolic Panel: Recent Labs    01/29/19 0439 01/30/19 0404 02/12/19 0700  NA 135 139 134*  K 3.8 3.9 3.8  CL 112* 116* 97*  CO2 18* 17* 28  GLUCOSE 89 71 123*  BUN 45* 24* 15  CREATININE 0.71 0.52 0.88  CALCIUM 7.6* 7.7* 8.4*   Liver Function Tests: Recent Labs    01/28/19 1237  AST 30  ALT 19  ALKPHOS 76  BILITOT 0.6  PROT 5.6*  ALBUMIN 2.7*   No results for input(s): LIPASE, AMYLASE in the last 8760 hours. No results for input(s): AMMONIA in the last 8760 hours. CBC: Recent Labs    01/28/19 1237 01/30/19 0404 02/12/19 0700  WBC  14.7* 7.4 6.6  NEUTROABS 13.4*  --  5.0  HGB 13.5 10.8* 12.3  HCT 39.0 31.8* 36.1  MCV 95.8 96.7 96.5  PLT 195 148* 228   Cardiac Enzymes: No results for input(s): CKTOTAL, CKMB, CKMBINDEX, TROPONINI in the last 8760 hours. BNP: Invalid input(s): POCBNP CBG: No results for input(s): GLUCAP in the last 8760 hours.  Procedures and Imaging Studies During Stay: Dg Lumbar Spine Complete  Result Date: 01/17/2019 CLINICAL DATA:  Low back and right hip pain for the past 2 days. No known injury. EXAM: LUMBAR SPINE - COMPLETE 4+ VIEW COMPARISON:  None. FINDINGS: Five non-rib-bearing lumbar vertebrae. Mild scoliosis. Diffuse osteopenia, making visualization of the bone detail more difficult. Facet degenerative changes throughout the lumbar spine with associated grade 1 retrolisthesis at the L3-4 level. There is an approximately 20% L2 vertebral inferior endplate compression deformity with sclerosis and no visible fracture lines. No pars defects are seen. Atheromatous arterial calcifications. Prominent stool. IMPRESSION: 1. Old approximately 20% L2 inferior endplate compression fracture. No acute fracture seen. 2. Facet degenerative changes throughout the lumbar spine with associated grade 1 retrolisthesis at the L3-4 level. 3. Prominent stool. Electronically Signed   By: Claudie Revering M.D.   On: 01/17/2019 19:02   Mr Lumbar Spine Wo Contrast  Result Date: 01/17/2019 CLINICAL DATA:  Severe back pain. No known trauma. Osteoporosis. Chronic steroid use. EXAM: MRI LUMBAR SPINE WITHOUT CONTRAST TECHNIQUE: Multiplanar, multisequence MR imaging of the lumbar spine was performed. No intravenous contrast was administered. COMPARISON:  None. FINDINGS: Segmentation: 5 non rib-bearing lumbar type vertebral bodies are present. The lowest fully formed vertebral body is L5. Alignment: Grade 1 anterolisthesis at L4-5 measures 5 mm. There is slight retrolisthesis at T12-L1 and L1-2. Vertebrae: Edematous marrow changes along  the inferior aspect of L2 consistent with a subacute fracture. No mass lesion is present. Marrow signal changes do not extend into the pedicles. Hemangioma is present at L5. Chronic edematous endplate marrow changes are present on the right at T12-L1. Conus medullaris and cauda equina: Conus extends to the L1-2 level. Conus and cauda equina appear normal. Paraspinal and other soft tissues: Limited imaging the abdomen is unremarkable. There is no significant adenopathy. No solid organ lesions are present. Disc levels: T12-L1: Negative. L1-2: Facet hypertrophy is present bilaterally. There is uncovering of a mild disc bulge. Central canal is patent. Mild foraminal narrowing is present bilaterally. L2-3: Mild facet hypertrophy is present bilaterally. Foramina are patent bilaterally. L3-4: A broad-based disc protrusion is asymmetric to the left. This results in mild left subarticular and foraminal narrowing. L4-5: There is uncovering of a broad-based disc protrusion. The central canal is patent. Foramina are patent bilaterally. L5-S1: Chronic loss of disc height is present. No significant stenosis is present. IMPRESSION: 1. Subacute inferior endplate compression fracture at L2 with less than 20% loss of height and no retropulsed bone. 2. Mild foraminal narrowing bilaterally at L1-2. 3. Mild left subarticular and foraminal narrowing at L3-4. 4. Grade 1 anterolisthesis at L4-5 without significant stenosis. Electronically Signed   By: San Morelle M.D.   On: 01/17/2019 21:28   Ct Abdomen Pelvis W Contrast  Result Date: 01/28/2019 CLINICAL DATA:  Abdominal distension, constipation, nausea and vomiting. EXAM: CT ABDOMEN AND PELVIS WITH CONTRAST TECHNIQUE: Multidetector CT imaging of the abdomen and pelvis was performed using the standard protocol following bolus administration of intravenous contrast. CONTRAST:  50mL OMNIPAQUE IOHEXOL 300 MG/ML  SOLN COMPARISON:  None. FINDINGS: Lower chest: There is atelectasis  at the right lung base with a trace pleural effusion. Mild atelectasis at the left lung base. Hepatobiliary: No focal liver abnormality is seen. No gallstones, gallbladder wall thickening, or biliary dilatation. Pancreas: Unremarkable. No pancreatic ductal dilatation or surrounding inflammatory changes. Spleen: Normal in size without focal abnormality. Adrenals/Urinary Tract: Adrenal glands are unremarkable. Kidneys are normal, without renal calculi, focal lesion, or hydronephrosis. Bladder is unremarkable. Stomach/Bowel: The stomach is very distended and fluid-filled without overt wall thickening, visible ulceration or evidence of perforation. There is some relative narrowing at the level of the distal antrum and pyloric region which may be secondary to peristalsis. A component of stenosis/stricture can not be excluded. Small bowel and colon are decompressed without evidence of obstruction, ileus or inflammatory process. No free air identified. No evidence of focal abscess. Vascular/Lymphatic: Atherosclerosis of the abdominal aorta and common iliac arteries without evidence of aneurysmal disease. No enlarged lymph nodes identified in the abdomen or pelvis. Reproductive: Uterus and bilateral adnexa are unremarkable. Other: No abdominal wall hernia or abnormality. No abdominopelvic ascites. Musculoskeletal: No acute or significant osseous findings. IMPRESSION: Significant gastric distension with fluid-filled stomach. There is no overt ulceration, mass or gastric wall thickening. Relative narrowing at the level of the distal antrum and pyloric region may be secondary to peristalsis or underlying stricture. Electronically Signed   By: Aletta Edouard M.D.   On: 01/28/2019 15:07   Dg Chest Port 1 View  Result Date: 01/29/2019 CLINICAL DATA:  NG placement EXAM: PORTABLE CHEST 1 VIEW COMPARISON:  01/28/2019 FINDINGS: NG tube in the stomach. Cardiac enlargement without heart failure. Mild bibasilar airspace disease  unchanged. Possible pneumonia or atelectasis. IMPRESSION: NG in the stomach Mild bibasilar airspace disease unchanged. Electronically Signed   By: Franchot Gallo M.D.   On: 01/29/2019 13:36   Dg Chest Port 1 View  Result Date: 01/28/2019 CLINICAL DATA:  Hypotension. EXAM: PORTABLE CHEST 1 VIEW COMPARISON:  11/25/2013 FINDINGS: Lungs are hypoinflated with mild bibasilar hazy opacification which may be due to atelectasis or overlying prominent soft tissue structures and much less likely infection. Mild cardiomegaly which is new. Coronary stent present over the left heart unchanged. Remainder the exam is unchanged. IMPRESSION: Mild hazy bibasilar opacification which may be due to atelectasis or overlying prominent soft tissues and less likely infection. Cardiomegaly which is new since the prior exam. Electronically Signed   By: Marin Olp M.D.   On: 01/28/2019 13:03   Dg Hip Unilat With Pelvis 2-3 Views Right  Result Date: 01/17/2019 CLINICAL DATA:  Low back pain EXAM: DG HIP (WITH OR WITHOUT PELVIS) 2-3V RIGHT COMPARISON:  None. FINDINGS: Moderate retained feces at the rectum. Pubic symphysis and rami appear intact. No fracture or malalignment. Vascular calcifications. IMPRESSION: No acute osseous abnormality. Electronically Signed   By: Donavan Foil M.D.   On: 01/17/2019 19:01    Assessment/Plan:   LE edema Bilateral Per patient this was new She was started on Lasix 40 mg QD 2 D Echo done in the facility showed EF of 60 % with Diastolic Dysfunction  PUD (peptic ulcer disease) Status post EGD Continued on PPIs Patient doing well eating no nausea.  Gastroesophageal reflux disease with esophagitis Now  on Protonix  Closed compression fracture of second lumbar vertebra Pain control with Ultram and TLSO brace Working with therapy and is able to walk with walker Acute renal insufficiency Thought to be due to dehydration Creatinine back to normal We will repeat BMP as out patient  especially as she is on Lasix Ischemic cardiomyopathy Repeat Echo did not show any Wall motion Abnirmality On Aldactone and Coreg, and losartan Continue low-dose aspirin She follows closely with cardiology  Benign essential HTN Controlled on losartan and Coreg  Mixed hyperlipidemia On Lipitor     Patient is being discharged with the following home health services:  [facility]  Patient is being discharged with the following durable medical equipment:  None  Patient has been advised to f/u with their PCP in 1-2 weeks to for a transitions of care visit.  Social services at their facility was responsible for arranging this appointment.  Pt was provided with adequate prescriptions of noncontrolled medications to reach the scheduled appointment .  For controlled substances, a limited supply was provided as appropriate for the individual patient.  If the pt normally receives these medications from a pain clinic or has a contract with another physician, these medications should be received from that clinic or physician only).    Future labs/tests needed:  BMP in 1 week to follow her Sodium on Lasix Discharge planning of more then 35 min

## 2019-02-16 DIAGNOSIS — N6019 Diffuse cystic mastopathy of unspecified breast: Secondary | ICD-10-CM | POA: Diagnosis not present

## 2019-02-16 DIAGNOSIS — E1169 Type 2 diabetes mellitus with other specified complication: Secondary | ICD-10-CM | POA: Diagnosis not present

## 2019-02-16 DIAGNOSIS — H903 Sensorineural hearing loss, bilateral: Secondary | ICD-10-CM | POA: Diagnosis not present

## 2019-02-16 DIAGNOSIS — Z7982 Long term (current) use of aspirin: Secondary | ICD-10-CM | POA: Diagnosis not present

## 2019-02-16 DIAGNOSIS — I10 Essential (primary) hypertension: Secondary | ICD-10-CM | POA: Diagnosis not present

## 2019-02-16 DIAGNOSIS — E782 Mixed hyperlipidemia: Secondary | ICD-10-CM | POA: Diagnosis not present

## 2019-02-16 DIAGNOSIS — Z79899 Other long term (current) drug therapy: Secondary | ICD-10-CM | POA: Diagnosis not present

## 2019-02-16 DIAGNOSIS — I252 Old myocardial infarction: Secondary | ICD-10-CM | POA: Diagnosis not present

## 2019-02-16 DIAGNOSIS — I2510 Atherosclerotic heart disease of native coronary artery without angina pectoris: Secondary | ICD-10-CM | POA: Diagnosis not present

## 2019-02-16 DIAGNOSIS — M5441 Lumbago with sciatica, right side: Secondary | ICD-10-CM | POA: Diagnosis not present

## 2019-02-16 DIAGNOSIS — I255 Ischemic cardiomyopathy: Secondary | ICD-10-CM | POA: Diagnosis not present

## 2019-02-16 DIAGNOSIS — E291 Testicular hypofunction: Secondary | ICD-10-CM | POA: Diagnosis not present

## 2019-02-17 ENCOUNTER — Ambulatory Visit: Payer: Self-pay

## 2019-02-18 DIAGNOSIS — M5441 Lumbago with sciatica, right side: Secondary | ICD-10-CM | POA: Diagnosis not present

## 2019-02-19 ENCOUNTER — Ambulatory Visit: Payer: Self-pay

## 2019-02-19 ENCOUNTER — Other Ambulatory Visit: Payer: Self-pay

## 2019-02-19 NOTE — Patient Outreach (Signed)
[place] [facility]) Care Management  02/19/2019  Debra Doyle 1928/11/23 [id number]   Referral received from [place] for outreach post SNF discharge.  Initial outreach unsuccessful. Left HIPAA compliant voice message requesting a return call.   PLAN Will follow up within 3-4 business days.   [place] [phone]

## 2019-02-25 ENCOUNTER — Other Ambulatory Visit: Payer: Self-pay

## 2019-02-25 NOTE — Patient Outreach (Signed)
[place] [facility]) Care Management  02/25/2019  Debra Doyle 10/12/1929 [id number]   Successful outreach with Ms. Wike and her daughter-in-law, Debra Doyle. They were very appreciative of the call, but Ms. Buchner prefers not to engage in telephonic outreach.   She was recently followed by [facility] and information regarding in-home assistance was provided. Debra confirmed outreach with Waymond Cera earlier today, and discussed resources available through Aging, Disability and Transit Services (ADTS) of [place].   PLAN Will complete case closure.   [place] [phone]

## 2019-02-26 DIAGNOSIS — I2510 Atherosclerotic heart disease of native coronary artery without angina pectoris: Secondary | ICD-10-CM | POA: Diagnosis not present

## 2019-02-26 DIAGNOSIS — K219 Gastro-esophageal reflux disease without esophagitis: Secondary | ICD-10-CM | POA: Diagnosis not present

## 2019-02-26 DIAGNOSIS — Z789 Other specified health status: Secondary | ICD-10-CM | POA: Diagnosis not present

## 2019-02-26 DIAGNOSIS — Z299 Encounter for prophylactic measures, unspecified: Secondary | ICD-10-CM | POA: Diagnosis not present

## 2019-02-26 DIAGNOSIS — I10 Essential (primary) hypertension: Secondary | ICD-10-CM | POA: Diagnosis not present

## 2019-02-26 DIAGNOSIS — Z6826 Body mass index (BMI) 26.0-26.9, adult: Secondary | ICD-10-CM | POA: Diagnosis not present

## 2019-02-27 ENCOUNTER — Telehealth: Payer: Self-pay | Admitting: Radiology

## 2019-02-27 DIAGNOSIS — S32020A Wedge compression fracture of second lumbar vertebra, initial encounter for closed fracture: Secondary | ICD-10-CM

## 2019-02-27 NOTE — Telephone Encounter (Signed)
It should be adjustable with an Investment banker, corporate. I asked where the brace came from, and it appeared in the hospital. I have advised they can try to adjust the brace, but she needs to wear it to prevent further collapse of the vertebra. She has voiced understanding.   To you FYI

## 2019-02-27 NOTE — Telephone Encounter (Signed)
Daughter called, said patient is now home, and is having issues with the back brace.  It is very heavy and cumbersome for her.  HHPT has tried to adjust for her, and has actually removed the front piece to try and help her be more comfortable.  It sits too high and they have to put a towel there to keep it from choking her.  They want to know if there is another brace that she can get that will be less cumbersome. Please call to advise?  I did tell daughter that this message won't be answered until early next week, she understands.

## 2019-02-28 NOTE — Telephone Encounter (Signed)
Cash brace is lighter I m cool with ordering

## 2019-03-04 ENCOUNTER — Telehealth: Payer: Self-pay | Admitting: Orthopedic Surgery

## 2019-03-04 NOTE — Addendum Note (Signed)
Addended byCandice Camp on: 03/04/2019 11:26 AM   Modules accepted: Orders

## 2019-03-04 NOTE — Telephone Encounter (Signed)
Call received via voice message from [place] at [facility], patient's primary care [phone], Ext [phone]). States their office received call from [facility] (ph#[phone]) in reference to previously noted information and orders. States patient is asking about the appointment with Dr Aline Brochure for 03/19/19 (this date per Dr Aline Brochure due to Covid-19 restrictions), and whether it can be moved up.  Patient's designated contacts are son and daughter-in-law: Shanon Brow and [employer], [phone].  Please advise.

## 2019-03-04 NOTE — Telephone Encounter (Signed)
I have printed, will mail to patient when signed, she can try to get this brace. I have faxed the order to [facility] and gave number to her daughter.

## 2019-03-05 ENCOUNTER — Telehealth: Payer: Self-pay | Admitting: Orthopedic Surgery

## 2019-03-05 NOTE — Telephone Encounter (Signed)
no

## 2019-03-05 NOTE — Telephone Encounter (Signed)
Maddie Finger-daughter-in-law of this patient has more questions regarding the brace and exactly what has been ordered and when.  Please call Maddie when you have time at [phone]  Thanks

## 2019-03-06 NOTE — Telephone Encounter (Signed)
I called her left message for her to advise is it a CASH brace and order faxed to [facility] and their phone number is [phone]  Patients daughter in law tells me this is what she is already in

## 2019-03-07 ENCOUNTER — Telehealth (INDEPENDENT_AMBULATORY_CARE_PROVIDER_SITE_OTHER): Payer: PPO | Admitting: Student

## 2019-03-07 ENCOUNTER — Encounter: Payer: Self-pay | Admitting: Student

## 2019-03-07 VITALS — BP 127/80 | Wt 142.0 lb

## 2019-03-07 DIAGNOSIS — I2510 Atherosclerotic heart disease of native coronary artery without angina pectoris: Secondary | ICD-10-CM

## 2019-03-07 DIAGNOSIS — Z7189 Other specified counseling: Secondary | ICD-10-CM

## 2019-03-07 DIAGNOSIS — I10 Essential (primary) hypertension: Secondary | ICD-10-CM

## 2019-03-07 DIAGNOSIS — Z8679 Personal history of other diseases of the circulatory system: Secondary | ICD-10-CM

## 2019-03-07 DIAGNOSIS — E785 Hyperlipidemia, unspecified: Secondary | ICD-10-CM

## 2019-03-07 NOTE — Patient Instructions (Addendum)
Medication Instructions:   Decrease your Lasix to as needed for weight gain or edema.    Continue all other medications.    Labwork: none  Testing/Procedures: none  Follow-Up: Your physician wants you to follow up in: 6 months.  You will receive a reminder letter in the mail one-two months in advance.  If you don't receive a letter, please call our office to schedule the follow up appointment. DR. MCDOWELL - EDEN   Any Other Special Instructions Will Be Listed Below (If Applicable).  If you need a refill on your cardiac medications before your next appointment, please call your pharmacy.

## 2019-03-07 NOTE — Progress Notes (Signed)
Virtual Visit via Telephone Note   This visit type was conducted due to national recommendations for restrictions regarding the COVID-19 Pandemic (e.g. social distancing) in an effort to limit this patient's exposure and mitigate transmission in our community.  Due to her co-morbid illnesses, this patient is at least at moderate risk for complications without adequate follow up.  This format is felt to be most appropriate for this patient at this time.  The patient did not have access to video technology/had technical difficulties with video requiring transitioning to audio format only (telephone).  All issues noted in this document were discussed and addressed.  No physical exam could be performed with this format.  Please refer to the patient's chart for her  consent to telehealth for [facility].   Evaluation Performed:  Follow-up visit  Date:  03/07/2019   ID:  Debra Doyle, DOB 1928-12-22, MRN [id number]  Patient Location: Home Provider Location: Home  PCP:  [name], MD  Cardiologist:  Rozann Lesches, MD  Electrophysiologist:  None   Chief Complaint: Hospital Follow-up  History of Present Illness:    Debra Doyle is a 85 y.o. female with past medical history of CAD (s/p DES x4 to LAD in 2012, ISR in 11/2013 with thrombectomy and angioplasty alone of LAD), ischemic cardiomyopathy (EF 20% by cath in 2015, improved to 50-55% by echo in 2017), HTN, and HLD who presents for a Telemedicine visit in regards to hospital follow-up.   She was last examined by Dr. Domenic Polite in 11/2018 and denied any recent chest pain or dyspnea on exertion at that time. Was continued on her current medication regimen including DAPT, BB, and statin therapy.   In the interim, she was admitted to [facility] from 3/17 - 01/31/2019 for evaluation of worsening vomiting and poor oral intake. Was found to have gastric outlet obstruction and underwent EGD which showed a duodenal ulcer and Grade D esophagitis.  Also noted to have an AKI felt to be secondary to hypotension and dehydration with this improving with administration of IVF. Plavix was discontinued and she was continued on ASA 81mg  daily. Was discharged to SNF for rehabilitation.   In talking with the patient today, she reports overall doing well since returning home from SNF. Denies any recent chest pain, dyspnea on exertion, orthopnea, or PND. Was experiencing lower extremity edema while in SNF but thinks this was due to sitting in a wheelchair throughout the day and not elevating her legs. Edema has resolved since returning home and weight has been stable at 142 lbs.    Says her appetite continues to improves. No recurrent nausea or vomiting.   Her son is assisting with her medications as he lives next door.   The patient does not have symptoms concerning for COVID-19 infection (fever, chills, cough, or new shortness of breath).    Past Medical History:  Diagnosis Date   Coronary atherosclerosis of native coronary artery    a. s/p DES x4 to LAD in 2012, ISR in 11/2013 with thrombectomy and angioplasty alone of LAD   Essential hypertension, benign    Fibrocystic breast disease    Hx of colonic polyps    Ischemic cardiomyopathy    Myocardial infarction [facility])    NSTEMI 1/12   ST elevation (STEMI) myocardial infarction involving left anterior descending coronary artery [facility])    January 2015 - LAD stent thrombosis off DAPT   Past Surgical History:  Procedure Laterality Date   CATARACT EXTRACTION, BILATERAL  Bilateral   ESOPHAGOGASTRODUODENOSCOPY N/A 01/29/2019   Procedure: ESOPHAGOGASTRODUODENOSCOPY (EGD);  Surgeon: Rogene Houston, MD;  [facility];  Service: Endoscopy;  Laterality: N/A;   LEFT HEART CATHETERIZATION WITH CORONARY ANGIOGRAM N/A 11/25/2013   Procedure: LEFT HEART CATHETERIZATION WITH CORONARY ANGIOGRAM;  Surgeon: Sinclair Grooms, MD;  Location: [facility] CATH LAB;  Service: Cardiovascular;   Laterality: N/A;   PERCUTANEOUS CORONARY STENT INTERVENTION (PCI-S)  11/25/2013   Procedure: PERCUTANEOUS CORONARY STENT INTERVENTION (PCI-S);  Surgeon: Sinclair Grooms, MD;  Location: [facility] CATH LAB;  Service: Cardiovascular;;     Current Meds  Medication Sig   acetaminophen (TYLENOL) 325 MG tablet Take 2 tablets (650 mg total) by mouth every 6 (six) hours as needed for fever or headache (pain).   Ascorbic Acid (VITAMIN C) 500 MG tablet Take 500 mg by mouth daily.     aspirin EC 81 MG tablet Take 1 tablet (81 mg total) by mouth daily.   atorvastatin (LIPITOR) 40 MG tablet Take 40 mg by mouth every evening.   carvedilol (COREG) 6.25 MG tablet Take 1 tablet (6.25 mg total) by mouth 2 (two) times daily.   furosemide (LASIX) 40 MG tablet Take 40 mg by mouth daily.    Lidocaine 4 % PTCH Apply 1 patch topically daily. Apply to back and remove within 12 hours   losartan (COZAAR) 25 MG tablet Give 2 tablets (50 mg) by mouth daily   Magnesium 250 MG TABS Take 1 tablet by mouth daily.   methocarbamol (ROBAXIN) 500 MG tablet Take 1 tablet (500 mg total) by mouth 2 (two) times daily as needed for muscle spasms.   Multiple Vitamin (MULTIVITAMIN) tablet Take 1 tablet by mouth daily.     nitroGLYCERIN (NITROSTAT) 0.4 MG SL tablet Place 0.4 mg under the tongue as needed for chest pain. Place one tablet under the tongue every 5 minutes x 3 doses max as needed for chest pain.  Contact MD or 911 with unrelieved pain   NON FORMULARY Diet Type:  NAS   Omega-3 Fatty Acids (FISH OIL) 1000 MG CAPS Take 1 capsule by mouth daily.    pantoprazole (PROTONIX) 40 MG tablet Take 1 tablet (40 mg total) by mouth 2 (two) times daily.   spironolactone (ALDACTONE) 25 MG tablet Take 12.5 mg by mouth daily.     Allergies:   Patient has no known allergies.   Social History   Tobacco Use   Smoking status: Never Smoker   Smokeless tobacco: Never Used  Substance Use Topics   Alcohol use: No   Drug use:  No     Family Hx: The patient's family history includes Cancer in her sister; Coronary artery disease in her mother; Emphysema in her father; Stroke in her brother.  ROS:   Please see the history of present illness.     All other systems reviewed and are negative.   Prior CV studies:   The following studies were reviewed today:  Echocardiogram: 12/2015 Study Conclusions  - Left ventricle: The cavity size was normal. Wall thickness was   increased in a pattern of mild LVH. Systolic function was normal.   The estimated ejection fraction was in the range of 50% to 55%.   Doppler parameters are consistent with abnormal left ventricular   relaxation (grade 1 diastolic dysfunction). - Aortic valve: Valve area (VTI): 2.54 cm^2. Valve area (Vmax):   2.54 cm^2. Valve area (Vmean): 2.75 cm^2. - Technically difficult study.  Labs/Other Tests and Data  Reviewed:    EKG:  No ECG reviewed.  Recent Labs: 01/28/2019: ALT 19 02/12/2019: BUN 15; Creatinine, Ser 0.88; Hemoglobin 12.3; Platelets 228; Potassium 3.8; Sodium 134   Recent Lipid Panel Lab Results  Component Value Date/Time   CHOL 176 11/26/2013 02:23 AM   TRIG 99 11/26/2013 02:23 AM   HDL 42 11/26/2013 02:23 AM   CHOLHDL 4.2 11/26/2013 02:23 AM   LDLCALC 114 (H) 11/26/2013 02:23 AM    Wt Readings from Last 3 Encounters:  03/07/19 142 lb (64.4 kg)  02/13/19 148 lb (67.1 kg)  02/10/19 156 lb (70.8 kg)     Objective:    Vital Signs:  BP 127/80    Wt 142 lb (64.4 kg)    BMI 26.83 kg/m    General: Pleasant female sounding in NAD Psych: Normal affect. Neuro: Alert and oriented X 3.  Lungs:  Resp sound unlabored while talking on the phone.   ASSESSMENT & PLAN:    1. CAD - she is s/p DES x4 to LAD in 2012 with ISR in 11/2013 with thrombectomy and angioplasty alone of LAD. Her situation is complex as she required thrombectomy 6 months after stopping DAPT in the past but she is now at higher risk with DAPT given recent EGD  showed a duodenal ulcer and Grade D esophagitis. Will update Dr. Domenic Polite, but I recommended she continue on ASA alone at this time as instructed during her admission. Remains off Plavix. Continue BB and statin therapy.   2. History of Ischemic Cardiomyopathy - EF 20% by cath in 2015, improved to 50-55% by echo in 2017. Was previously having lower extremity edema while at SNF but this has now resolved. No recent orthopnea, PND, or dyspnea on exertion.  - suspect her symptoms were likely secondary to dependent edema. Her son reports she consumes minimal fluids during the day.  - will reduce Lasix from 40mg  daily to 40mg  PRN for edema or weight gain. Encouraged her to continue to follow daily weights.  - continue Coreg, Losartan, and Spironolactone.   3. HTN - BP has been well-controlled when checked at home, at 127/80 on most recent check.  - continue Coreg, Losartan, and Spironolactone at current dosing.   4. HLD - followed by PCP. LDL at 58 in 2019. Remains on Atorvastatin 40mg  daily.   5. COVID-19 Education - The signs and symptoms of COVID-19 were discussed with the patient. The importance of social distancing was discussed today.  Time:   Today, I have spent 19 minutes with the patient with telehealth technology discussing the above problems.     Medication Adjustments/Labs and Tests Ordered: Current medicines are reviewed at length with the patient today.  Concerns regarding medicines are outlined above.   Tests Ordered: No orders of the defined types were placed in this encounter.   Medication Changes: No orders of the defined types were placed in this encounter.   Disposition:  Follow up with Dr. Domenic Polite in 6 months.   Signed, Erma Heritage, PA-C  03/07/2019 4:49 PM    [facility]

## 2019-03-19 ENCOUNTER — Ambulatory Visit: Payer: PPO | Admitting: Orthopedic Surgery

## 2019-03-19 ENCOUNTER — Encounter: Payer: Self-pay | Admitting: Orthopedic Surgery

## 2019-03-19 ENCOUNTER — Other Ambulatory Visit: Payer: Self-pay

## 2019-03-19 VITALS — BP 111/70 | HR 63 | Temp 99.0°F | Ht 61.0 in | Wt 142.0 lb

## 2019-03-19 DIAGNOSIS — M5441 Lumbago with sciatica, right side: Secondary | ICD-10-CM | POA: Diagnosis not present

## 2019-03-19 DIAGNOSIS — S32009A Unspecified fracture of unspecified lumbar vertebra, initial encounter for closed fracture: Secondary | ICD-10-CM

## 2019-03-19 NOTE — Patient Instructions (Signed)
Extend physical therapy  Brace optional  Encourage fluids  Encourage getting up at mealtime

## 2019-03-19 NOTE — Progress Notes (Signed)
NEW PROBLEM OFFICE VISIT  Chief Complaint  Patient presents with  . Back Pain    eval compression fracture L26     85 year old female presented to the ER back on March 6 was sent home with probable old compression fracture came back to the hospital for dehydration was admitted and eventually had a work-up which showed she probably has an L2 endplate fracture  Eventually she had severe pain could not walk but now she can at least get up with a walker with therapy although she spends most the time in her bed  The pain is located in the lower back and over the L2 spinous process.  She has some overall weakness requiring a walker  Her pain is now mild except to palpation which increases in intensity and is described as a dull throbbing sensation with no associated numbness or tingling   Review of Systems  Constitutional: Negative for fever.  Gastrointestinal:       No bowel bladder symptoms that would suggest spinal stenosis  Musculoskeletal: Positive for back pain.  Skin: Negative for rash.  Neurological: Positive for weakness.     Past Medical History:  Diagnosis Date  . Coronary atherosclerosis of native coronary artery    a. s/p DES x4 to LAD in 2012, ISR in 11/2013 with thrombectomy and angioplasty alone of LAD  . Essential hypertension, benign   . Fibrocystic breast disease   . Hx of colonic polyps   . Ischemic cardiomyopathy   . Myocardial infarction [facility])    NSTEMI 1/12  . ST elevation (STEMI) myocardial infarction involving left anterior descending coronary artery [facility])    January 2015 - LAD stent thrombosis off DAPT    Past Surgical History:  Procedure Laterality Date  . CATARACT EXTRACTION, BILATERAL     Bilateral  . ESOPHAGOGASTRODUODENOSCOPY N/A 01/29/2019   Procedure: ESOPHAGOGASTRODUODENOSCOPY (EGD);  Surgeon: Rogene Houston, MD;  Location[facility];  Service: Endoscopy;  Laterality: N/A;  . LEFT HEART CATHETERIZATION WITH CORONARY ANGIOGRAM N/A  11/25/2013   Procedure: LEFT HEART CATHETERIZATION WITH CORONARY ANGIOGRAM;  Surgeon: Sinclair Grooms, MD;  Location: [facility] CATH LAB;  Service: Cardiovascular;  Laterality: N/A;  . PERCUTANEOUS CORONARY STENT INTERVENTION (PCI-S)  11/25/2013   Procedure: PERCUTANEOUS CORONARY STENT INTERVENTION (PCI-S);  Surgeon: Sinclair Grooms, MD;  Location: [facility] CATH LAB;  Service: Cardiovascular;;    Family History  Problem Relation Age of Onset  . Coronary artery disease Mother   . Emphysema Father   . Cancer Sister        1 sister/cancer type unknown  . Stroke Brother        1 brother   Social History   Tobacco Use  . Smoking status: Never Smoker  . Smokeless tobacco: Never Used  Substance Use Topics  . Alcohol use: No  . Drug use: No    No Known Allergies  Current Meds  Medication Sig  . acetaminophen (TYLENOL) 325 MG tablet Take 2 tablets (650 mg total) by mouth every 6 (six) hours as needed for fever or headache (pain).  . Ascorbic Acid (VITAMIN C) 500 MG tablet Take 500 mg by mouth daily.    Marland Kitchen aspirin EC 81 MG tablet Take 1 tablet (81 mg total) by mouth daily.  Marland Kitchen atorvastatin (LIPITOR) 40 MG tablet Take 40 mg by mouth every evening.  . carvedilol (COREG) 6.25 MG tablet Take 1 tablet (6.25 mg total) by mouth 2 (two) times daily.  . furosemide (LASIX)  40 MG tablet Take 40 mg by mouth daily.   . Lidocaine 4 % PTCH Apply 1 patch topically daily. Apply to back and remove within 12 hours  . losartan (COZAAR) 25 MG tablet Give 2 tablets (50 mg) by mouth daily  . Magnesium 250 MG TABS Take 1 tablet by mouth daily.  . methocarbamol (ROBAXIN) 500 MG tablet Take 1 tablet (500 mg total) by mouth 2 (two) times daily as needed for muscle spasms.  . Multiple Vitamin (MULTIVITAMIN) tablet Take 1 tablet by mouth daily.    . nitroGLYCERIN (NITROSTAT) 0.4 MG SL tablet Place 0.4 mg under the tongue as needed for chest pain. Place one tablet under the tongue every 5 minutes x 3 doses max as needed for  chest pain.  Contact MD or 911 with unrelieved pain  . NON FORMULARY Diet Type:  NAS  . Omega-3 Fatty Acids (FISH OIL) 1000 MG CAPS Take 1 capsule by mouth daily.   . pantoprazole (PROTONIX) 40 MG tablet Take 1 tablet (40 mg total) by mouth 2 (two) times daily.  Marland Kitchen spironolactone (ALDACTONE) 25 MG tablet Take 12.5 mg by mouth daily.    BP 111/70   Pulse 63   Temp 99 F (37.2 C)   Ht 5\' 1"  (1.549 m)   Wt 142 lb (64.4 kg)   BMI 26.83 kg/m   Physical Exam Vitals signs and nursing note reviewed. Exam conducted with a chaperone present.  Constitutional:      General: She is not in acute distress.    Appearance: She is not ill-appearing, toxic-appearing or diaphoretic.     Comments: Thin elderly female kyphotic deformity  Neurological:     General: No focal deficit present.     Mental Status: She is alert and oriented to person, place, and time.  Psychiatric:        Mood and Affect: Mood normal.        Behavior: Behavior normal.        Thought Content: Thought content normal.        Judgment: Judgment normal.     Ortho Exam Lumbar tenderness at the L4-5 interspace as well as the L2 vertebral body and spine kyphotic deformity thoracic region no pathologic skin lesions normal muscle tone in the back decreased extension  Both lower extremities are aligned equally have normal strength and motion no skin deficits no tenderness no evidence of joint subluxation  MEDICAL DECISION SECTION  Xrays were done at [place]:  Low back and right hip pain for the past 2 days. No known injury.   EXAM: LUMBAR SPINE - COMPLETE 4+ VIEW   COMPARISON:  None.   FINDINGS: Five non-rib-bearing lumbar vertebrae. Mild scoliosis. Diffuse osteopenia, making visualization of the bone detail more difficult. Facet degenerative changes throughout the lumbar spine with associated grade 1 retrolisthesis at the L3-4 level. There is an approximately 20% L2 vertebral inferior endplate  compression deformity with sclerosis and no visible fracture lines. No pars defects are seen. Atheromatous arterial calcifications. Prominent stool.   IMPRESSION: 1. Old approximately 20% L2 inferior endplate compression fracture. No acute fracture seen. 2. Facet degenerative changes throughout the lumbar spine with associated grade 1 retrolisthesis at the L3-4 level. 3. Prominent stool.     Electronically Signed   By: Claudie Revering M.D.   On: 01/17/2019 19:02   CLINICAL DATA:  Severe back pain. No known trauma. Osteoporosis. Chronic steroid use.   EXAM: MRI LUMBAR SPINE WITHOUT CONTRAST  TECHNIQUE: Multiplanar, multisequence MR imaging of the lumbar spine was performed. No intravenous contrast was administered.   COMPARISON:  None.   FINDINGS: Segmentation: 5 non rib-bearing lumbar type vertebral bodies are present. The lowest fully formed vertebral body is L5.   Alignment: Grade 1 anterolisthesis at L4-5 measures 5 mm. There is slight retrolisthesis at T12-L1 and L1-2.   Vertebrae: Edematous marrow changes along the inferior aspect of L2 consistent with a subacute fracture. No mass lesion is present. Marrow signal changes do not extend into the pedicles. Hemangioma is present at L5. Chronic edematous endplate marrow changes are present on the right at T12-L1.   Conus medullaris and cauda equina: Conus extends to the L1-2 level. Conus and cauda equina appear normal.   Paraspinal and other soft tissues: Limited imaging the abdomen is unremarkable. There is no significant adenopathy. No solid organ lesions are present.   Disc levels:   T12-L1: Negative.   L1-2: Facet hypertrophy is present bilaterally. There is uncovering of a mild disc bulge. Central canal is patent. Mild foraminal narrowing is present bilaterally.   L2-3: Mild facet hypertrophy is present bilaterally. Foramina are patent bilaterally.   L3-4: A broad-based disc protrusion is asymmetric to  the left. This results in mild left subarticular and foraminal narrowing.   L4-5: There is uncovering of a broad-based disc protrusion. The central canal is patent. Foramina are patent bilaterally.   L5-S1: Chronic loss of disc height is present. No significant stenosis is present.   IMPRESSION: 1. Subacute inferior endplate compression fracture at L2 with less than 20% loss of height and no retropulsed bone. 2. Mild foraminal narrowing bilaterally at L1-2. 3. Mild left subarticular and foraminal narrowing at L3-4. 4. Grade 1 anterolisthesis at L4-5 without significant stenosis.     Electronically Signed   By: San Morelle M.D.   On: 01/17/2019 21:28   My independent reading of xrays:  Plain films lumbar spine degenerative changes are seen in the lumbar spine with no evidence of acute fracture  MRI multilevels of degenerative disc disease bulging disc formation and old T12 fracture old L1 fracture some bone edema in L2 appears to be a fracture line running through L3 does not appear acute  Probable L2 endplate subacute fracture  Encounter Diagnosis  Name Primary?  . Closed fracture of lumbar vertebral body (HCC)-L2 Yes    PLAN: (Rx., injectx, surgery, frx, mri/ct)   No orders of the defined types were placed in this encounter. Symptoms have improved significantly although patient is noted diminished ability to be active at home.  Her daughter is concerned about her inactivity and lack of hydration  Patient is encouraged to drink water and to go to the dinner table so she can get out  Brace is now optional  Recommend further physical therapy to improve strength range of motion and gait to enhance ability to perform routine activities of daily living  Follow-up 1 month lumbar x-ray  Arther Abbott, MD  03/19/2019 10:21 AM

## 2019-04-02 ENCOUNTER — Telehealth: Payer: Self-pay | Admitting: Orthopedic Surgery

## 2019-04-02 DIAGNOSIS — S32020A Wedge compression fracture of second lumbar vertebra, initial encounter for closed fracture: Secondary | ICD-10-CM

## 2019-04-02 DIAGNOSIS — S32009A Unspecified fracture of unspecified lumbar vertebra, initial encounter for closed fracture: Secondary | ICD-10-CM

## 2019-04-02 NOTE — Telephone Encounter (Signed)
ok 

## 2019-04-02 NOTE — Telephone Encounter (Signed)
Pt's daughter in law, Maddie called in stating that Debra Doyle is still having home health PT but they feel that she would benefit from having a lift chair.  She wanted to know if Dr. Aline Brochure would write prescription for this.  She also wanted to know if prescription is written how do they go about getting the chair.  Please call her regarding the prescription and how to get the chair  Thanks so much

## 2019-04-02 NOTE — Telephone Encounter (Signed)
She wants to try [facility]  I have faxed order  I said insurance may not cover, and there are usually lots of forms they send to [place], and this takes time She voiced understanding

## 2019-04-18 ENCOUNTER — Encounter: Payer: Self-pay | Admitting: Orthopedic Surgery

## 2019-04-18 ENCOUNTER — Ambulatory Visit (INDEPENDENT_AMBULATORY_CARE_PROVIDER_SITE_OTHER): Payer: PPO

## 2019-04-18 ENCOUNTER — Other Ambulatory Visit: Payer: Self-pay

## 2019-04-18 ENCOUNTER — Ambulatory Visit (INDEPENDENT_AMBULATORY_CARE_PROVIDER_SITE_OTHER): Payer: PPO | Admitting: Orthopedic Surgery

## 2019-04-18 VITALS — BP 140/85 | HR 74 | Ht 61.0 in

## 2019-04-18 DIAGNOSIS — S32020D Wedge compression fracture of second lumbar vertebra, subsequent encounter for fracture with routine healing: Secondary | ICD-10-CM

## 2019-04-18 NOTE — Progress Notes (Signed)
Chief Complaint  Patient presents with  . Back Pain    feels "funny" when sitting at dinner table 01/17/19    85 years old follow-up for compression fracture on or about March 82   85 year old female presented to the ER back on March 6 was sent home with probable old compression fracture came back to the hospital for dehydration was admitted and eventually had a work-up which showed she probably has an L2 endplate fracture   Eventually she had severe pain could not walk but now she can at least get up with a walker with therapy although she spends most the time in her bed   The pain is located in the lower back and over the L2 spinous process.  She has some overall weakness requiring a walker   Encounter Diagnosis  Name Primary?  . Compression fracture of L2 vertebra with routine healing, subsequent encounter 01/17/19 Yes    The fracture has healed and she has pain in the lower back while sitting.  No f/u is needed for the compression fracture seems to be having functional deterioration

## 2019-06-09 DIAGNOSIS — K219 Gastro-esophageal reflux disease without esophagitis: Secondary | ICD-10-CM | POA: Diagnosis not present

## 2019-06-09 DIAGNOSIS — Z299 Encounter for prophylactic measures, unspecified: Secondary | ICD-10-CM | POA: Diagnosis not present

## 2019-06-09 DIAGNOSIS — Z6826 Body mass index (BMI) 26.0-26.9, adult: Secondary | ICD-10-CM | POA: Diagnosis not present

## 2019-06-09 DIAGNOSIS — I10 Essential (primary) hypertension: Secondary | ICD-10-CM | POA: Diagnosis not present

## 2019-06-09 DIAGNOSIS — E7800 Pure hypercholesterolemia, unspecified: Secondary | ICD-10-CM | POA: Diagnosis not present

## 2019-06-09 DIAGNOSIS — S32020D Wedge compression fracture of second lumbar vertebra, subsequent encounter for fracture with routine healing: Secondary | ICD-10-CM | POA: Diagnosis not present

## 2019-06-11 ENCOUNTER — Telehealth: Payer: Self-pay

## 2019-06-11 NOTE — Telephone Encounter (Signed)
Spoke with Nira Conn from [facility] to clarify if this referral is for hospice or palliative care. Nira Conn shared that patient has had a decline and has been clear that her goals of care are for comfort care. Family would like hospice services. Referral forwarded to hospice intake with clarification that this is for hospice

## 2019-07-27 ENCOUNTER — Other Ambulatory Visit: Payer: Self-pay | Admitting: Cardiology

## 2019-08-24 ENCOUNTER — Other Ambulatory Visit: Payer: Self-pay | Admitting: Cardiology

## 2019-09-03 ENCOUNTER — Telehealth (INDEPENDENT_AMBULATORY_CARE_PROVIDER_SITE_OTHER): Payer: PPO | Admitting: Cardiology

## 2019-09-03 ENCOUNTER — Encounter: Payer: Self-pay | Admitting: Cardiology

## 2019-09-03 VITALS — BP 104/62 | HR 68 | Ht 61.0 in | Wt 131.0 lb

## 2019-09-03 DIAGNOSIS — I10 Essential (primary) hypertension: Secondary | ICD-10-CM | POA: Diagnosis not present

## 2019-09-03 DIAGNOSIS — Z8679 Personal history of other diseases of the circulatory system: Secondary | ICD-10-CM | POA: Diagnosis not present

## 2019-09-03 DIAGNOSIS — E782 Mixed hyperlipidemia: Secondary | ICD-10-CM | POA: Diagnosis not present

## 2019-09-03 DIAGNOSIS — I255 Ischemic cardiomyopathy: Secondary | ICD-10-CM

## 2019-09-03 DIAGNOSIS — K279 Peptic ulcer, site unspecified, unspecified as acute or chronic, without hemorrhage or perforation: Secondary | ICD-10-CM | POA: Diagnosis not present

## 2019-09-03 DIAGNOSIS — I25119 Atherosclerotic heart disease of native coronary artery with unspecified angina pectoris: Secondary | ICD-10-CM

## 2019-09-03 NOTE — Progress Notes (Signed)
Virtual Visit via Telephone Note   This visit type was conducted due to national recommendations for restrictions regarding the COVID-19 Pandemic (e.g. social distancing) in an effort to limit this patient's exposure and mitigate transmission in our community.  Due to her co-morbid illnesses, this patient is at least at moderate risk for complications without adequate follow up.  This format is felt to be most appropriate for this patient at this time.  The patient did not have access to video technology/had technical difficulties with video requiring transitioning to audio format only (telephone).  All issues noted in this document were discussed and addressed.  No physical exam could be performed with this format.  Please refer to the patient's chart for her  consent to telehealth for [facility].   Date:  09/03/2019   ID:  Debra Doyle, DOB [birth year], MRN [id number]  Patient Location: Home Provider Location: Office  PCP:  [name], MD  Cardiologist:  Rozann Lesches, MD Electrophysiologist:  None   Evaluation Performed:  Follow-Up Visit  Chief Complaint:   Cardiac follow-up  History of Present Illness:    Debra Doyle is an 85 y.o. female last assessed via telehealth encounter by Ms. Strader PA-C in April.  We spoke by phone today, I also talked with her son.  She is still living in her own home, her son lives next door and helps her by preparing all meals, doing shopping, also addressing her medications.  She states that she has been doing fairly well other than hip pain and constipation.  She does not report any angina symptoms.  At the last visit she was continued on aspirin, had been taken off Plavix after GI work-up in the setting of duodenal ulcer and grade D esophagitis.  Weight is down overall, she does not report any leg swelling.  She has continued on Lasix and Aldactone.  She continues to follow with Dr. Manuella Ghazi.  I reviewed her medications as listed below.  The  patient does not have symptoms concerning for COVID-19 infection (fever, chills, cough, or new shortness of breath).    Past Medical History:  Diagnosis Date  . Coronary atherosclerosis of native coronary artery    a. s/p DES x4 to LAD in 2012, ISR in 11/2013 with thrombectomy and angioplasty alone of LAD  . Essential hypertension, benign   . Fibrocystic breast disease   . Hx of colonic polyps   . Ischemic cardiomyopathy   . Myocardial infarction [facility])    NSTEMI 1/12  . ST elevation (STEMI) myocardial infarction involving left anterior descending coronary artery [facility])    January 2015 - LAD stent thrombosis off DAPT   Past Surgical History:  Procedure Laterality Date  . CATARACT EXTRACTION, BILATERAL     Bilateral  . ESOPHAGOGASTRODUODENOSCOPY N/A 01/29/2019   Procedure: ESOPHAGOGASTRODUODENOSCOPY (EGD);  Surgeon: Rogene Houston, MD;  [facility];  Service: Endoscopy;  Laterality: N/A;  . LEFT HEART CATHETERIZATION WITH CORONARY ANGIOGRAM N/A 11/25/2013   Procedure: LEFT HEART CATHETERIZATION WITH CORONARY ANGIOGRAM;  Surgeon: Sinclair Grooms, MD;  Location: [facility] CATH LAB;  Service: Cardiovascular;  Laterality: N/A;  . PERCUTANEOUS CORONARY STENT INTERVENTION (PCI-S)  11/25/2013   Procedure: PERCUTANEOUS CORONARY STENT INTERVENTION (PCI-S);  Surgeon: Sinclair Grooms, MD;  Location: [facility] C[facility] LAB;  Service: Cardiovascular;;     Current Meds  Medication Sig  . acetaminophen (TYLENOL) 325 MG tablet Take 2 tablets (650 mg total) by mouth every 6 (six) hours as  needed for fever or headache (pain).  . Ascorbic Acid (VITAMIN C) 500 MG tablet Take 500 mg by mouth daily.    Marland Kitchen aspirin EC 81 MG tablet Take 1 tablet (81 mg total) by mouth daily.  Marland Kitchen atorvastatin (LIPITOR) 40 MG tablet TAKE 1 TABLET BY MOUTH EVERY DAY AT 6:00 PM  . carvedilol (COREG) 6.25 MG tablet TAKE 1 TABLET BY MOUTH TWICE DAILY  . cyanocobalamin 1000 MCG tablet Take 1,000 mcg by mouth daily.  . furosemide (LASIX)  40 MG tablet Take 40 mg by mouth daily.   Marland Kitchen losartan (COZAAR) 50 MG tablet TAKE 1 TABLET BY MOUTH EVERY DAY  . Multiple Vitamin (MULTIVITAMIN) tablet Take 1 tablet by mouth daily.    . nitroGLYCERIN (NITROSTAT) 0.4 MG SL tablet Place 0.4 mg under the tongue as needed for chest pain. Place one tablet under the tongue every 5 minutes x 3 doses max as needed for chest pain.  Contact MD or 911 with unrelieved pain  . pantoprazole (PROTONIX) 40 MG tablet Take 1 tablet (40 mg total) by mouth 2 (two) times daily.  Marland Kitchen senna (SENOKOT) 8.6 MG TABS tablet Take 1 tablet by mouth daily as needed for mild constipation.  Marland Kitchen spironolactone (ALDACTONE) 25 MG tablet TAKE 1/2 TABLET BY MOUTH EVERY DAY     Allergies:   Patient has no known allergies.   Social History   Tobacco Use  . Smoking status: Never Smoker  . Smokeless tobacco: Never Used  Substance Use Topics  . Alcohol use: No  . Drug use: No     Family Hx: The patient's family history includes Cancer in her sister; Coronary artery disease in her mother; Emphysema in her father; Stroke in her brother.  ROS:   Please see the history of present illness.    Hearing loss, arthritic pain. All other systems reviewed and are negative.   Prior CV studies:   The following studies were reviewed today:  Echocardiogram 01/05/2016: Study Conclusions  - Left ventricle: The cavity size was normal. Wall thickness was   increased in a pattern of mild LVH. Systolic function was normal.   The estimated ejection fraction was in the range of 50% to 55%.   Doppler parameters are consistent with abnormal left ventricular   relaxation (grade 1 diastolic dysfunction). - Aortic valve: Valve area (VTI): 2.54 cm^2. Valve area (Vmax):   2.54 cm^2. Valve area (Vmean): 2.75 cm^2. - Technically difficult study.  Labs/Other Tests and Data Reviewed:    EKG:  An ECG dated 06/10/2018 was personally reviewed today and demonstrated:  Sinus rhythm with left bundle branch  block.  Recent Labs: 01/28/2019: ALT 19 02/12/2019: BUN 15; Creatinine, Ser 0.88; Hemoglobin 12.3; Platelets 228; Potassium 3.8; Sodium 134    Wt Readings from Last 3 Encounters:  09/03/19 131 lb (59.4 kg)  03/19/19 142 lb (64.4 kg)  03/07/19 142 lb (64.4 kg)     Objective:    Vital Signs:  BP 104/62   Pulse 68   Ht 5\' 1"  (1.549 m)   Wt 131 lb (59.4 kg)   BMI 24.75 kg/m    Patient spoke in full sentences, not short of breath. No audible wheezing or coughing.  ASSESSMENT & PLAN:    1.  CAD status post DES x4 to the LAD in 2012 with subsequent stent thrombosis in 2015.  On aspirin alone, Plavix stopped after GI work-up earlier in the year with findings of duodenal ulcer and grade D esophagitis.  She does  not report any active angina at this time.  2.  Ischemic cardiomyopathy by history, last assessment of LVEF was in the 50 to 55% range.  Weight is down, no obvious fluid overload reported, no leg swelling.  She continues on Lasix and Aldactone along with Coreg and losartan.  Check BMET for next visit.  3.  Essential hypertension, blood pressure is well controlled today.  4.  Hyperlipidemia, on Lipitor.  COVID-19 Education: The signs and symptoms of COVID-19 were discussed with the patient and how to seek care for testing (follow up with PCP or arrange E-visit).  The importance of social distancing was discussed today.  Time:   Today, I have spent 7 minutes with the patient with telehealth technology discussing the above problems.     Medication Adjustments/Labs and Tests Ordered: Current medicines are reviewed at length with the patient today.  Concerns regarding medicines are outlined above.   Tests Ordered: No orders of the defined types were placed in this encounter.   Medication Changes: No orders of the defined types were placed in this encounter.   Follow Up:  In Person 4 months in the [place] office.  Signed, Rozann Lesches, MD  09/03/2019 1:09 PM    [place]

## 2019-09-03 NOTE — Patient Instructions (Addendum)
Medication Instructions:   Your physician recommends that you continue on your current medications as directed. Please refer to the Current Medication list given to you today.  Labwork:  Your physician recommends that you return for non-fasting lab work in: 4 months just before your next visit to check your BMET. Your lab order has been mailed to you. You may have this done at [facility] or [facility]. No appointment is necessary.  Testing/Procedures:  NONE  Follow-Up:  Your physician recommends that you schedule a follow-up appointment in: 4 months. You will receive a reminder letter in the mail in about 1-2 months reminding you to call and schedule your appointment. If you don't receive this letter, please contact our office.  Any Other Special Instructions Will Be Listed Below (If Applicable).  If you need a refill on your cardiac medications before your next appointment, please call your pharmacy.

## 2020-01-07 ENCOUNTER — Telehealth: Payer: Self-pay | Admitting: Cardiology

## 2020-01-07 NOTE — Telephone Encounter (Signed)
Per Maddie - patient is home bound they changed office visit to virtual.  She will not be able to get blood work done before visit with Domenic Polite.  They would like to know if this will be ok or will they need to reschedule her visit

## 2020-01-07 NOTE — Telephone Encounter (Signed)
Daughter in law informed that a virtual visit is fine and the lab work can be done prior to her appointment on 01/29/2020. Verbalized understanding.

## 2020-01-09 ENCOUNTER — Ambulatory Visit: Payer: PPO | Admitting: Cardiology

## 2020-01-29 ENCOUNTER — Encounter: Payer: Self-pay | Admitting: Cardiology

## 2020-01-29 ENCOUNTER — Telehealth (INDEPENDENT_AMBULATORY_CARE_PROVIDER_SITE_OTHER): Payer: PPO | Admitting: Cardiology

## 2020-01-29 DIAGNOSIS — I255 Ischemic cardiomyopathy: Secondary | ICD-10-CM | POA: Diagnosis not present

## 2020-01-29 DIAGNOSIS — I25119 Atherosclerotic heart disease of native coronary artery with unspecified angina pectoris: Secondary | ICD-10-CM | POA: Diagnosis not present

## 2020-01-29 DIAGNOSIS — I10 Essential (primary) hypertension: Secondary | ICD-10-CM | POA: Diagnosis not present

## 2020-01-29 NOTE — Progress Notes (Signed)
Virtual Visit via Telephone Note   This visit type was conducted due to national recommendations for restrictions regarding the COVID-19 Pandemic (e.g. social distancing) in an effort to limit this patient's exposure and mitigate transmission in our community.  Due to her co-morbid illnesses, this patient is at least at moderate risk for complications without adequate follow up.  This format is felt to be most appropriate for this patient at this time.  The patient did not have access to video technology/had technical difficulties with video requiring transitioning to audio format only (telephone).  All issues noted in this document were discussed and addressed.  No physical exam could be performed with this format.  Please refer to the patient's chart for her  consent to telehealth for [facility]   The patient was identified using 2 identifiers.  Date:  01/29/2020   ID:  Debra Doyle, DOB 1929/08/28, MRN [id number]  Patient Location: Home Provider Location: Office  PCP:  [name], MD  Cardiologist:  Rozann Lesches, MD Electrophysiologist:  None   Evaluation Performed:  Follow-Up Visit  Chief Complaint:  Cardiac follow-up  History of Present Illness:    Debra Doyle is an 86 y.o. female last assessed via telehealth encounter in October 2020.  We spoke by phone today.  I also talked with her daughter-in-law.  Debra Doyle does not describe any recent chest pain.  Her daughter-in-law mentions that there are times when she is anxious.  They are working on home health nursing, at this point she has not had a follow-up BMET as yet.  I reviewed her medications which are outlined below.  Current cardiac regimen includes aspirin, Coreg, Lipitor, Lasix, Cozaar, Aldactone, and as needed nitroglycerin.  The patient does not have symptoms concerning for COVID-19 infection (fever, chills, cough, or new shortness of breath).  She just recently got her second vaccine dose.   Past Medical  History:  Diagnosis Date  . Coronary atherosclerosis of native coronary artery    a. s/p DES x4 to LAD in 2012, ISR in 11/2013 with thrombectomy and angioplasty alone of LAD  . Essential hypertension   . Fibrocystic breast disease   . Hx of colonic polyps   . Ischemic cardiomyopathy   . Myocardial infarction [facility])    NSTEMI 1/12  . ST elevation (STEMI) myocardial infarction involving left anterior descending coronary artery [facility])    January 2015 - LAD stent thrombosis off DAPT   Past Surgical History:  Procedure Laterality Date  . CATARACT EXTRACTION, BILATERAL     Bilateral  . ESOPHAGOGASTRODUODENOSCOPY N/A 01/29/2019   Procedure: ESOPHAGOGASTRODUODENOSCOPY (EGD);  Surgeon: Rogene Houston, MD;  Location[facility];  Service: Endoscopy;  Laterality: N/A;  . LEFT HEART CATHETERIZATION WITH CORONARY ANGIOGRAM N/A 11/25/2013   Procedure: LEFT HEART CATHETERIZATION WITH CORONARY ANGIOGRAM;  Surgeon: Sinclair Grooms, MD;  Location: [facility] CATH LAB;  Service: Cardiovascular;  Laterality: N/A;  . PERCUTANEOUS CORONARY STENT INTERVENTION (PCI-S)  11/25/2013   Procedure: PERCUTANEOUS CORONARY STENT INTERVENTION (PCI-S);  Surgeon: Sinclair Grooms, MD;  Location: [facility] CATH LAB;  Service: Cardiovascular;;     Current Meds  Medication Sig  . acetaminophen (TYLENOL) 325 MG tablet Take 2 tablets (650 mg total) by mouth every 6 (six) hours as needed for fever or headache (pain).  . Ascorbic Acid (VITAMIN C) 500 MG tablet Take 500 mg by mouth daily.    Marland Kitchen aspirin EC 81 MG tablet Take 1 tablet (81 mg total) by  mouth daily.  Marland Kitchen atorvastatin (LIPITOR) 40 MG tablet TAKE 1 TABLET BY MOUTH EVERY DAY AT 6:00 PM  . carvedilol (COREG) 6.25 MG tablet TAKE 1 TABLET BY MOUTH TWICE DAILY  . cyanocobalamin 1000 MCG tablet Take 1,000 mcg by mouth daily.  . furosemide (LASIX) 40 MG tablet Take 40 mg by mouth daily.   Marland Kitchen losartan (COZAAR) 50 MG tablet TAKE 1 TABLET BY MOUTH EVERY DAY  . LUTEIN PO Take 1 capsule by  mouth daily.  . nitroGLYCERIN (NITROSTAT) 0.4 MG SL tablet Place 0.4 mg under the tongue as needed for chest pain. Place one tablet under the tongue every 5 minutes x 3 doses max as needed for chest pain.  Contact MD or 911 with unrelieved pain  . pantoprazole (PROTONIX) 40 MG tablet Take 40 mg by mouth every evening.  . senna (SENOKOT) 8.6 MG TABS tablet Take 1 tablet by mouth daily as needed for mild constipation.  Marland Kitchen Specialty Vitamins Products (BILBERRY PLUS PO) Take 1 capsule by mouth daily.  Marland Kitchen spironolactone (ALDACTONE) 25 MG tablet TAKE 1/2 TABLET BY MOUTH EVERY DAY  . [DISCONTINUED] multivitamin-lutein (OCUVITE-LUTEIN) CAPS capsule Take 1 capsule by mouth daily.  . [DISCONTINUED] pantoprazole (PROTONIX) 40 MG tablet Take 1 tablet (40 mg total) by mouth 2 (two) times daily. (Patient taking differently: Take 40 mg by mouth daily. )     Allergies:   Patient has no known allergies.   ROS:   Intermittent anxiety.  Prior CV studies:   The following studies were reviewed today:  Echocardiogram 01/05/2016: Study Conclusions  - Left ventricle: The cavity size was normal. Wall thickness was increased in a pattern of mild LVH. Systolic function was normal. The estimated ejection fraction was in the range of 50% to 55%. Doppler parameters are consistent with abnormal left ventricular relaxation (grade 1 diastolic dysfunction). - Aortic valve: Valve area (VTI): 2.54 cm^2. Valve area (Vmax): 2.54 cm^2. Valve area (Vmean): 2.75 cm^2. - Technically difficult study.  Labs/Other Tests and Data Reviewed:    EKG:  An ECG dated 06/10/2018 was personally reviewed today and demonstrated:  Sinus rhythm with left bundle branch block.  Recent Labs: 02/12/2019: BUN 15; Creatinine, Ser 0.88; Hemoglobin 12.3; Platelets 228; Potassium 3.8; Sodium 134    Wt Readings from Last 3 Encounters:  01/29/20 125 lb (56.7 kg)  09/03/19 131 lb (59.4 kg)  03/19/19 142 lb (64.4 kg)     Objective:     Vital Signs:  BP 139/84   Ht 5\' 2"  (1.575 m)   Wt 125 lb (56.7 kg)   BMI 22.86 kg/m    Patient spoke in full sentences, not obviously short of breath at rest. No audible wheezing or coughing.  ASSESSMENT & PLAN:    1.  CAD status post DES x4 to the LAD in 2012 with subsequent stent thrombosis in 2015.  She has been clinically stable and is on aspirin at this time (Plavix stopped previously as discussed in the last note).  Continue beta-blocker, ARB, and Lipitor.  2.  Ischemic cardiomyopathy with last LVEF 50 to 55%.  She is also on Aldactone and Lasix in addition to the above.  3.  Essential hypertension, no changes made to current regimen.   Time:   Today, I have spent 6 minutes with the patient with telehealth technology discussing the above problems.     Medication Adjustments/Labs and Tests Ordered: Current medicines are reviewed at length with the patient today.  Concerns regarding medicines are outlined above.  Tests Ordered: No orders of the defined types were placed in this encounter.   Medication Changes: No orders of the defined types were placed in this encounter.   Follow Up:  Either In Person or Virtual 6 months.  Signed, Rozann Lesches, MD  01/29/2020 3:09 PM    [place]

## 2020-01-29 NOTE — Patient Instructions (Addendum)
Medication Instructions:   Your physician recommends that you continue on your current medications as directed. Please refer to the Current Medication list given to you today.  Labwork:  Please get requested lab work through home health once this is established  Testing/Procedures:  NONE  Follow-Up:  Your physician recommends that you schedule a follow-up appointment in: 6 months (office). You will receive a reminder letter in the mail in about 4 months reminding you to call and schedule your appointment. If you don't receive this letter, please contact our office.  Any Other Special Instructions Will Be Listed Below (If Applicable).  If you need a refill on your cardiac medications before your next appointment, please call your pharmacy.

## 2020-02-22 ENCOUNTER — Other Ambulatory Visit: Payer: Self-pay | Admitting: Cardiology

## 2020-03-04 ENCOUNTER — Telehealth: Payer: Self-pay | Admitting: Cardiology

## 2020-03-04 NOTE — Telephone Encounter (Signed)
Debra Doyle-daughter in law called stating that Dr. Domenic Polite had written an order for labs last October. They are ready now to take her. Will she need a new order?  Please call [phone]

## 2020-03-04 NOTE — Telephone Encounter (Signed)
Advised son [name] that lab order for BMET written in October 2020 is okay to use.

## 2020-03-11 ENCOUNTER — Other Ambulatory Visit: Payer: Self-pay

## 2020-03-11 ENCOUNTER — Other Ambulatory Visit (HOSPITAL_COMMUNITY)
Admission: RE | Admit: 2020-03-11 | Discharge: 2020-03-11 | Disposition: A | Discharge status: Home / Self Care | Payer: PPO | Source: Ambulatory Visit | Attending: Cardiology | Admitting: Cardiology

## 2020-03-11 DIAGNOSIS — I25119 Atherosclerotic heart disease of native coronary artery with unspecified angina pectoris: Secondary | ICD-10-CM | POA: Diagnosis not present

## 2020-03-11 DIAGNOSIS — I255 Ischemic cardiomyopathy: Secondary | ICD-10-CM | POA: Insufficient documentation

## 2020-03-11 DIAGNOSIS — I10 Essential (primary) hypertension: Secondary | ICD-10-CM | POA: Insufficient documentation

## 2020-03-11 LAB — BASIC METABOLIC PANEL
Anion gap: 9 (ref 5–15)
BUN: 30 mg/dL — ABNORMAL HIGH (ref 8–23)
CO2: 28 mmol/L (ref 22–32)
Calcium: 9 mg/dL (ref 8.9–10.3)
Chloride: 94 mmol/L — ABNORMAL LOW (ref 98–111)
Creatinine, Ser: 0.92 mg/dL (ref 0.44–1.00)
GFR calc Af Amer: >60 mL/min (ref >60)
GFR calc non Af Amer: 56 mL/min — ABNORMAL LOW (ref >60)
Glucose, Bld: 126 mg/dL — ABNORMAL HIGH (ref 70–99)
Potassium: 3.6 mmol/L (ref 3.5–5.1)
Sodium: 131 mmol/L — ABNORMAL LOW (ref 135–145)

## 2020-03-17 ENCOUNTER — Telehealth: Payer: Self-pay | Admitting: *Deleted

## 2020-03-17 NOTE — Telephone Encounter (Signed)
Patient informed. Copy sent to PCP °

## 2020-03-17 NOTE — Telephone Encounter (Signed)
-----   Message from Satira Sark, MD sent at 03/11/2020  8:44 PM EDT ----- Results reviewed.  Sodium level mildly decreased, but this has fluctuated over time.  Potassium level and creatinine are normal range however.  Continue with current medications.

## 2020-04-20 ENCOUNTER — Other Ambulatory Visit: Payer: Self-pay | Admitting: Cardiology

## 2020-05-03 DIAGNOSIS — I10 Essential (primary) hypertension: Secondary | ICD-10-CM | POA: Diagnosis not present

## 2020-05-03 DIAGNOSIS — Z789 Other specified health status: Secondary | ICD-10-CM | POA: Diagnosis not present

## 2020-05-03 DIAGNOSIS — Z299 Encounter for prophylactic measures, unspecified: Secondary | ICD-10-CM | POA: Diagnosis not present

## 2020-05-23 ENCOUNTER — Other Ambulatory Visit: Payer: Self-pay | Admitting: Cardiology

## 2020-06-08 DIAGNOSIS — Z0000 Encounter for general adult medical examination without abnormal findings: Secondary | ICD-10-CM | POA: Diagnosis not present

## 2020-06-08 DIAGNOSIS — Z1339 Encounter for screening examination for other mental health and behavioral disorders: Secondary | ICD-10-CM | POA: Diagnosis not present

## 2020-06-08 DIAGNOSIS — E7800 Pure hypercholesterolemia, unspecified: Secondary | ICD-10-CM | POA: Diagnosis not present

## 2020-06-08 DIAGNOSIS — R5383 Other fatigue: Secondary | ICD-10-CM | POA: Diagnosis not present

## 2020-06-08 DIAGNOSIS — Z7189 Other specified counseling: Secondary | ICD-10-CM | POA: Diagnosis not present

## 2020-06-08 DIAGNOSIS — Z1331 Encounter for screening for depression: Secondary | ICD-10-CM | POA: Diagnosis not present

## 2020-06-08 DIAGNOSIS — I10 Essential (primary) hypertension: Secondary | ICD-10-CM | POA: Diagnosis not present

## 2020-06-08 DIAGNOSIS — Z6821 Body mass index (BMI) 21.0-21.9, adult: Secondary | ICD-10-CM | POA: Diagnosis not present

## 2020-06-08 DIAGNOSIS — Z299 Encounter for prophylactic measures, unspecified: Secondary | ICD-10-CM | POA: Diagnosis not present

## 2020-06-08 DIAGNOSIS — Z79899 Other long term (current) drug therapy: Secondary | ICD-10-CM | POA: Diagnosis not present

## 2020-06-08 DIAGNOSIS — I2510 Atherosclerotic heart disease of native coronary artery without angina pectoris: Secondary | ICD-10-CM | POA: Diagnosis not present

## 2020-06-28 DIAGNOSIS — E871 Hypo-osmolality and hyponatremia: Secondary | ICD-10-CM | POA: Diagnosis not present

## 2020-11-12 IMAGING — MR MR LUMBAR SPINE W/O CM
4 of 5 series · 13 of 48 positions shown · non-contrast
Comparison: None.

CLINICAL DATA: Severe back pain. No known trauma. Osteoporosis.
Chronic steroid use.

EXAM:
MRI LUMBAR SPINE WITHOUT CONTRAST
TECHNIQUE: Multiplanar, multisequence MR imaging of the lumbar spine was
performed. No intravenous contrast was administered.

[Series 3: T2 · sagittal · 4.0mm · 0.37mm/px · 4 of 15 slices shown (1 of 3)]
[im 1/15]
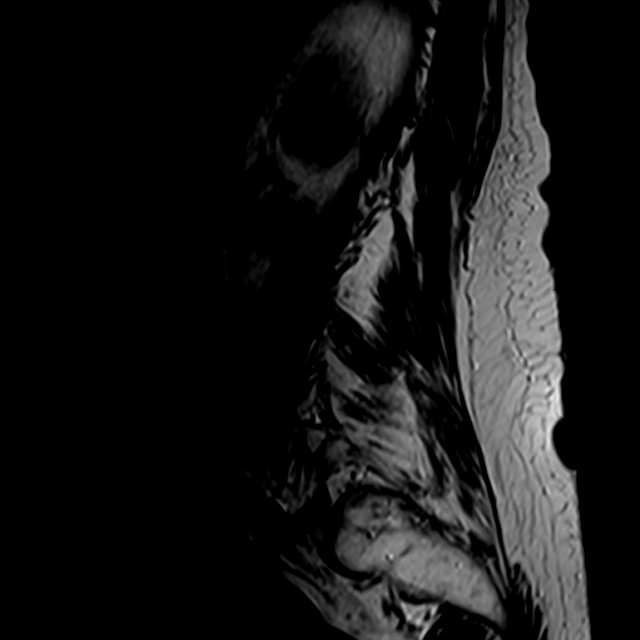
[im 4/15]
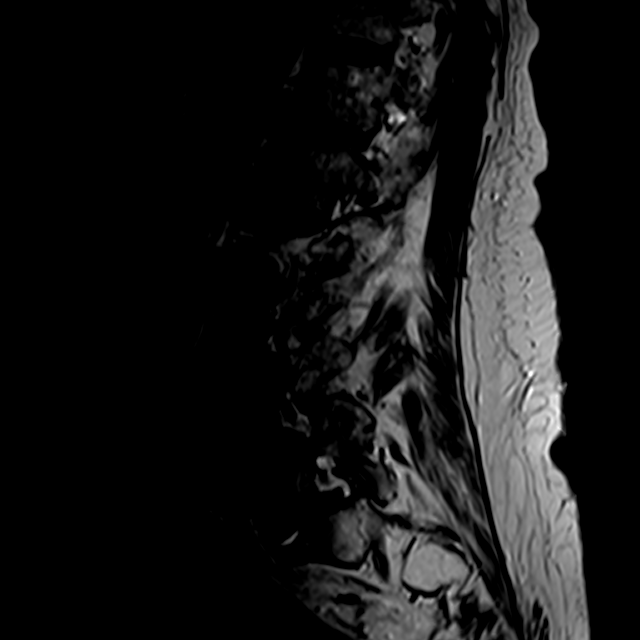
[im 8/15]
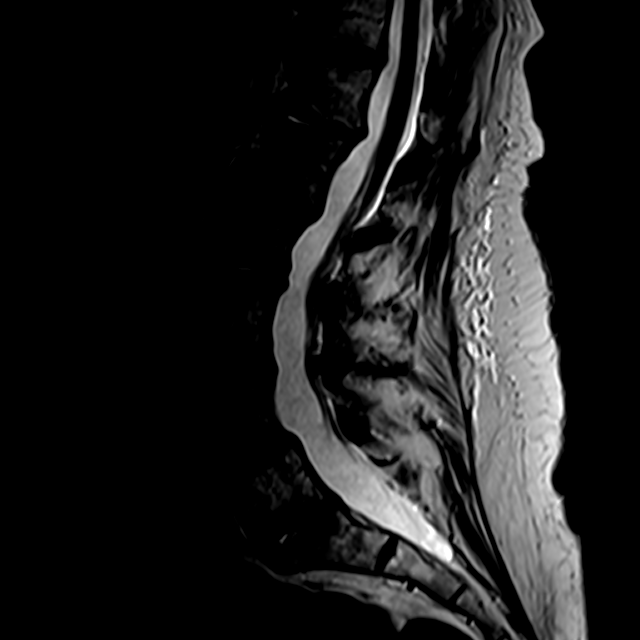
[im 15/15]
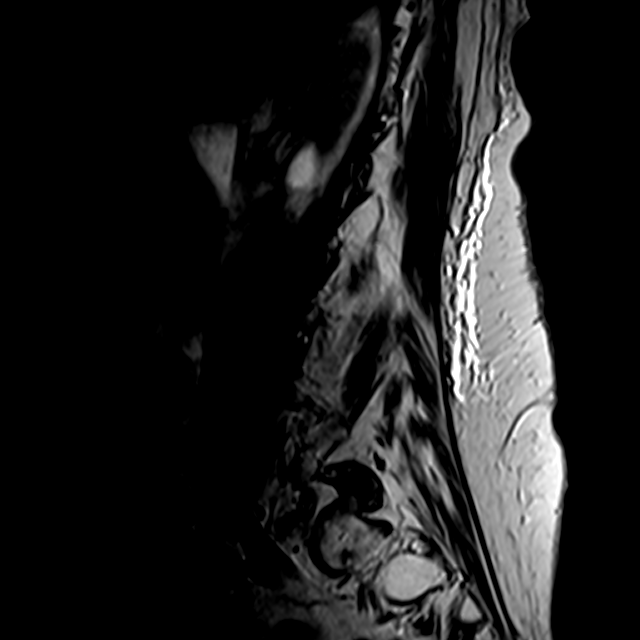

[Series 4: T1 · sagittal · 4.0mm · 0.37mm/px · 3 of 15 slices shown]
[im 1/15]
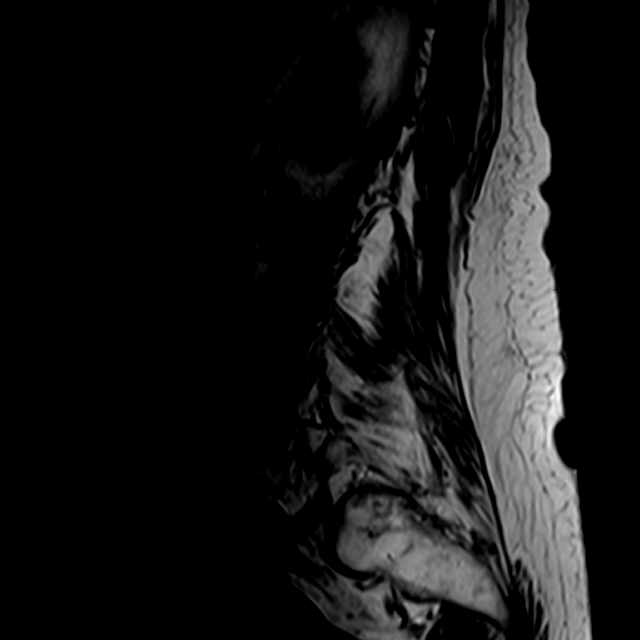
[im 8/15]
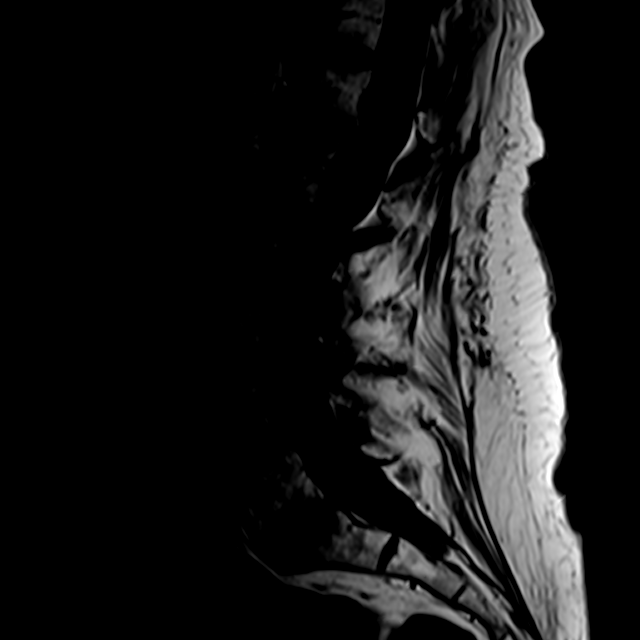
[im 15/15]
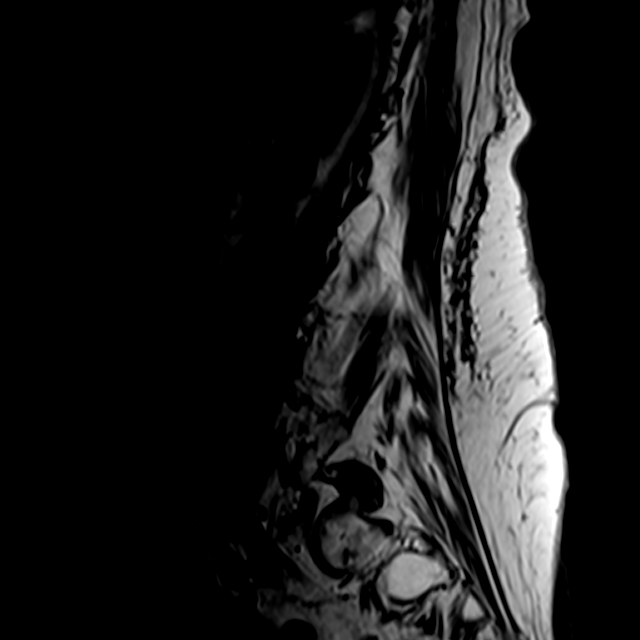

[Series 5: T2 · sagittal · 4.0mm · 0.37mm/px · 3 of 18 slices shown (2 of 3)]
[im 4/18]
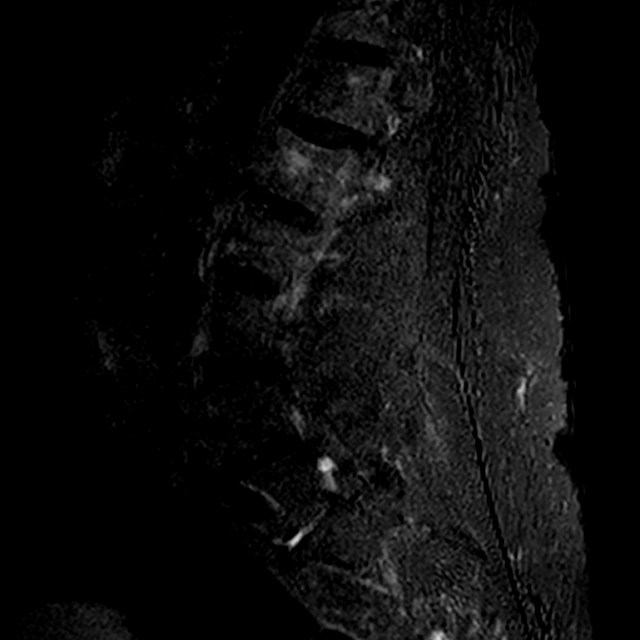
[im 11/18]
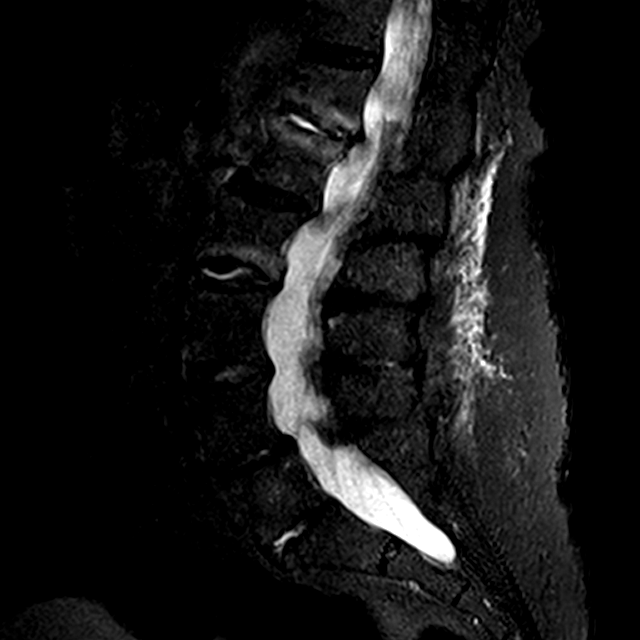
[im 18/18]
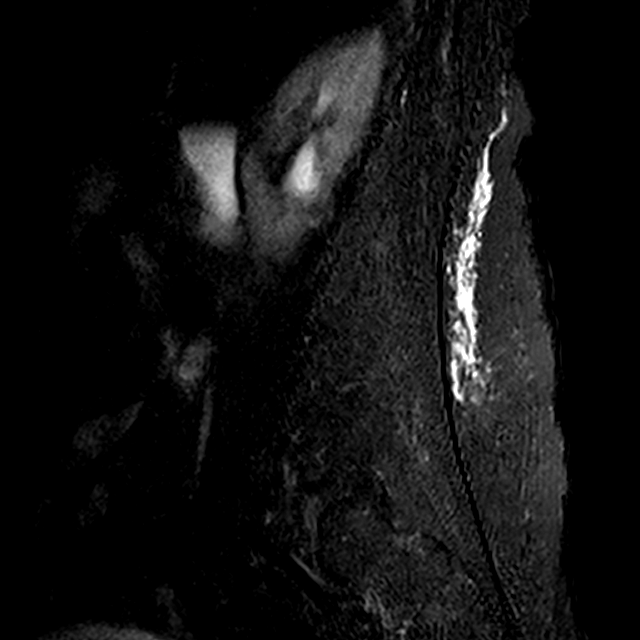

[Series 6: T2 · axial · 4.0mm · 0.27mm/px · z∈[-3,+136]mm · 3 of 44 slices shown (3 of 3)]
[im 6/44]
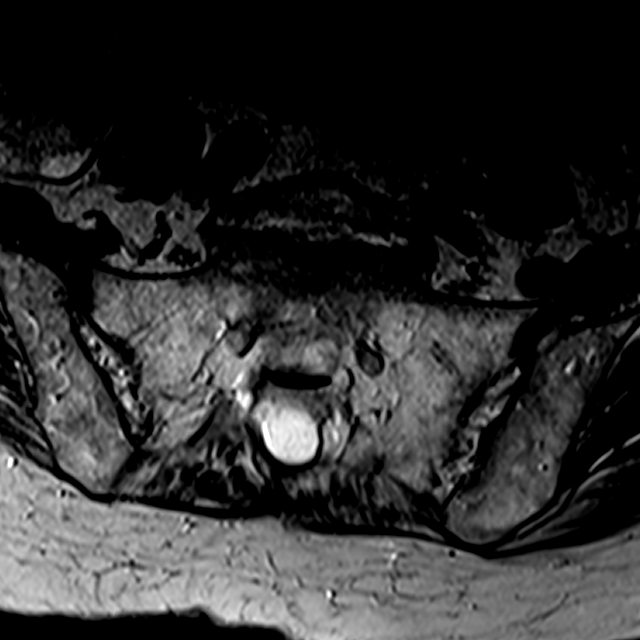
[im 23/44]
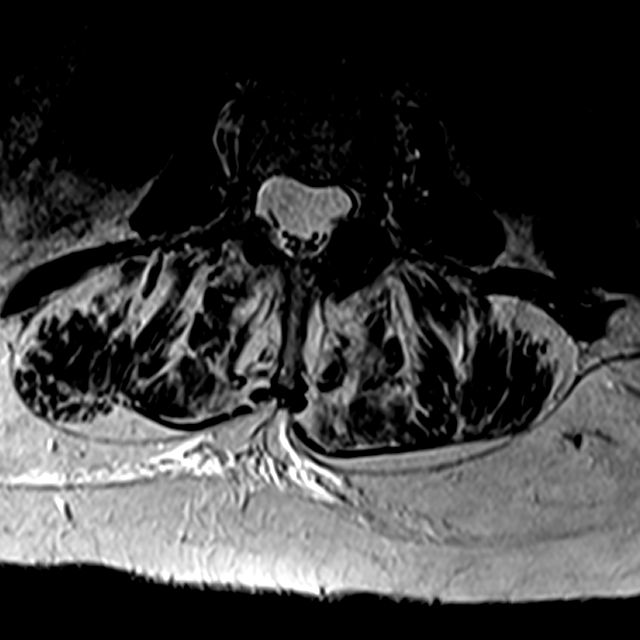
[im 38/44]
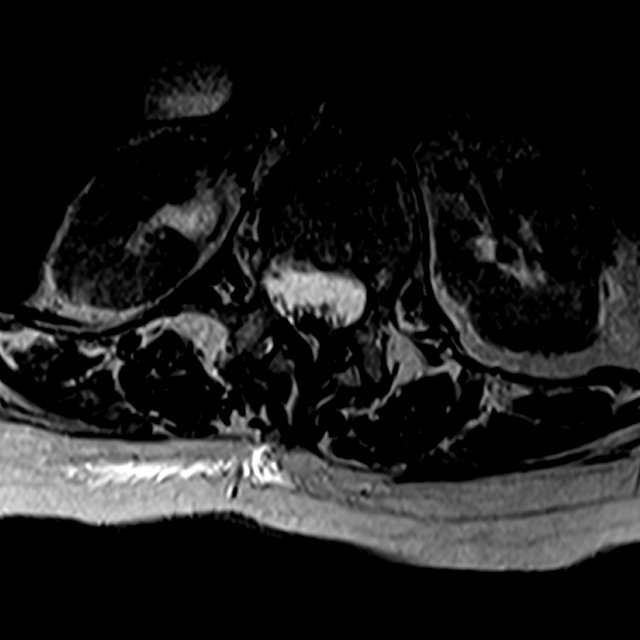

[13 of 48 positions shown; findings below may reference images not displayed]

FINDINGS: Segmentation: 5 non rib-bearing lumbar type vertebral bodies are
present. The lowest fully formed vertebral body is L5.

Alignment: Grade 1 anterolisthesis at L4-5 measures 5 mm. There is
slight retrolisthesis at T12-L1 and L1-2.

Vertebrae: Edematous marrow changes along the inferior aspect of L2
consistent with a subacute fracture. No mass lesion is present.
Marrow signal changes do not extend into the pedicles. Hemangioma is
present at L5. Chronic edematous endplate marrow changes are present
on the right at T12-L1.

Conus medullaris and cauda equina: Conus extends to the L1-2 level.
Conus and cauda equina appear normal.

Paraspinal and other soft tissues: Limited imaging the abdomen is
unremarkable. There is no significant adenopathy. No solid organ
lesions are present.

Disc levels:

T12-L1: Negative.

L1-2: Facet hypertrophy is present bilaterally. There is uncovering
of a mild disc bulge. Central canal is patent. Mild foraminal
narrowing is present bilaterally.

L2-3: Mild facet hypertrophy is present bilaterally. Foramina are
patent bilaterally.

L3-4: A broad-based disc protrusion is asymmetric to the left. This
results in mild left subarticular and foraminal narrowing.

L4-5: There is uncovering of a broad-based disc protrusion. The
central canal is patent. Foramina are patent bilaterally.

L5-S1: Chronic loss of disc height is present. No significant
stenosis is present.
IMPRESSION: 1. Subacute inferior endplate compression fracture at L2 with less
than 20% loss of height and no retropulsed bone.
2. Mild foraminal narrowing bilaterally at L1-2.
3. Mild left subarticular and foraminal narrowing at L3-4.
4. Grade 1 anterolisthesis at L4-5 without significant stenosis.

## 2020-11-12 IMAGING — DX DG LUMBAR SPINE COMPLETE 4+V
6 series · 6 of 6 positions shown · non-contrast
Comparison: None.

CLINICAL DATA: Low back and right hip pain for the past 2 days. No
known injury.

EXAM:
LUMBAR SPINE - COMPLETE 4+ VIEW

[l-spine ap]
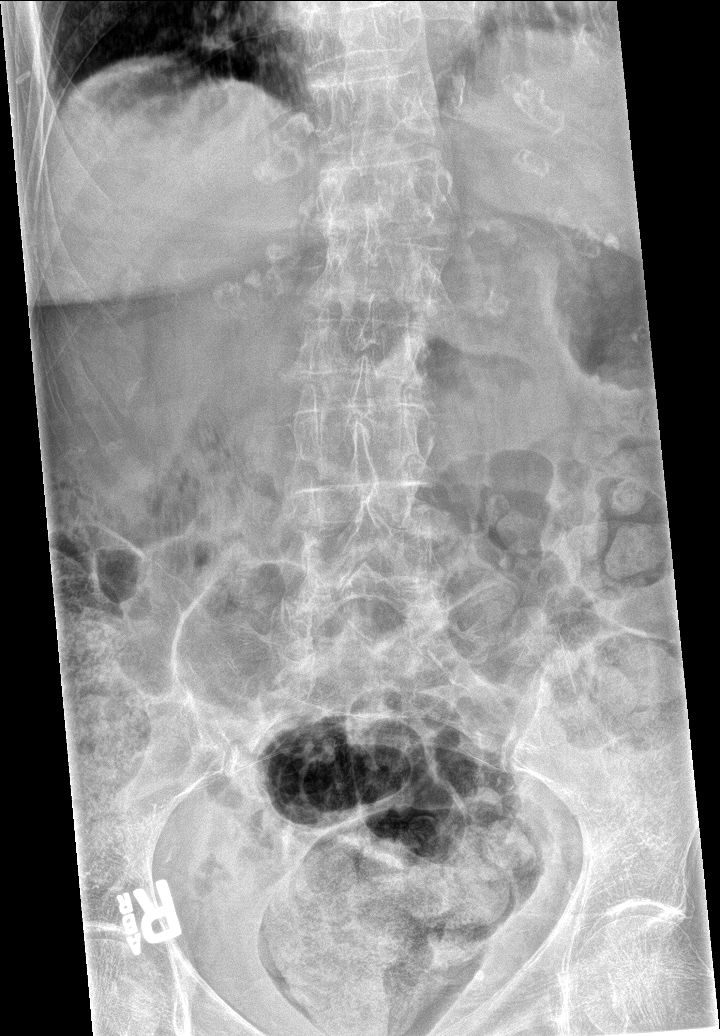

[l-spine obl (1 of 2)]
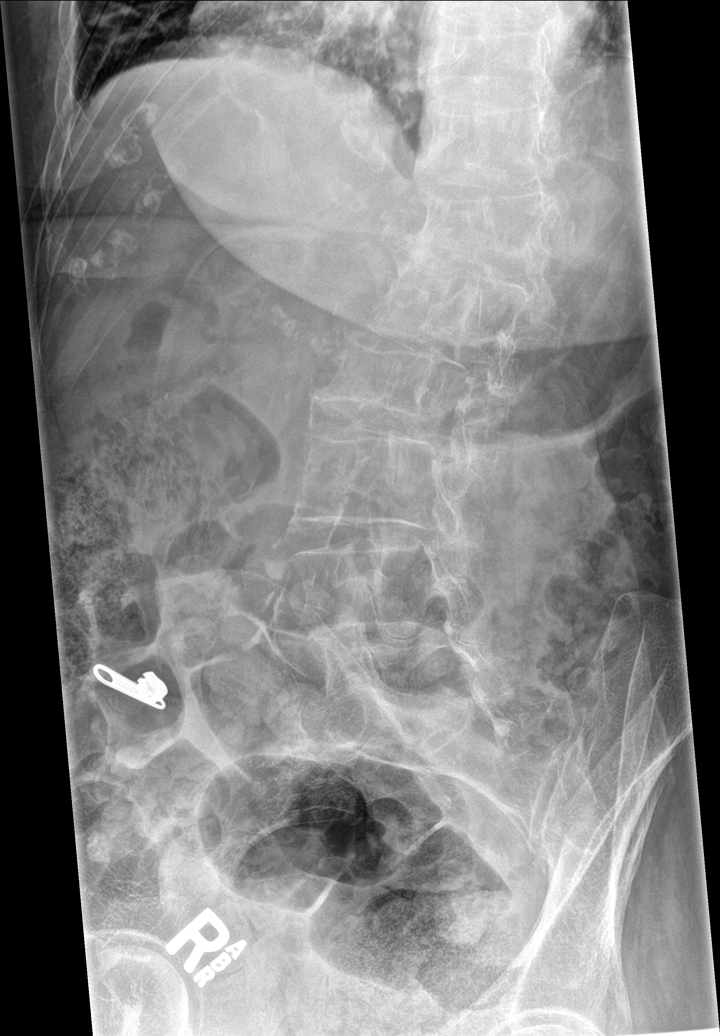

[l-spine obl (2 of 2)]
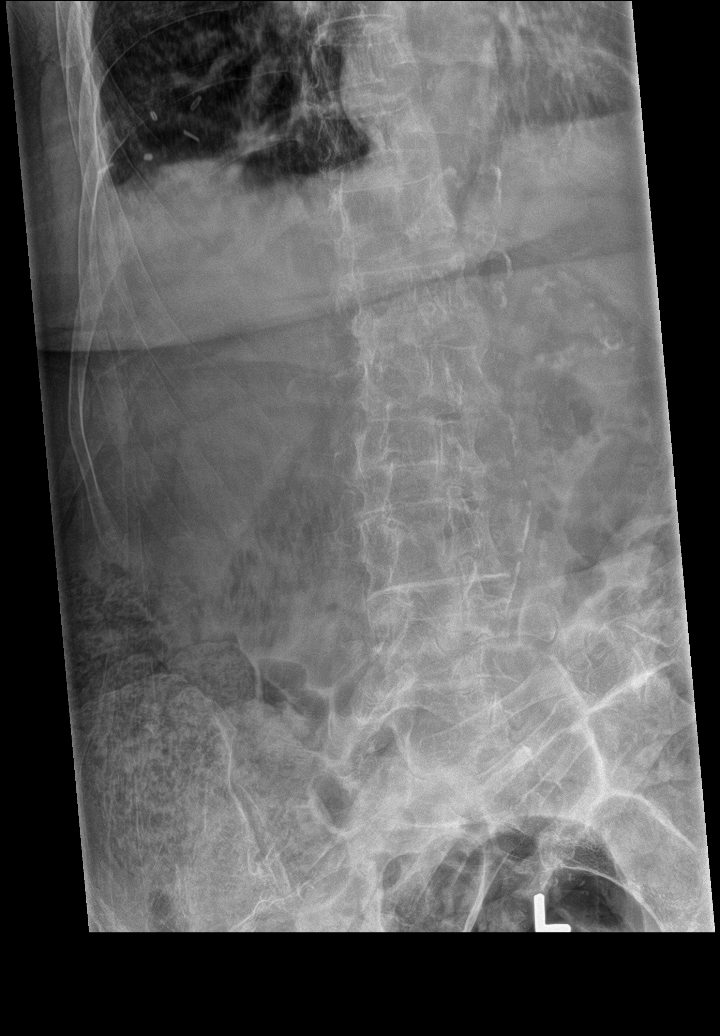

[l-spine lat (1 of 2)]
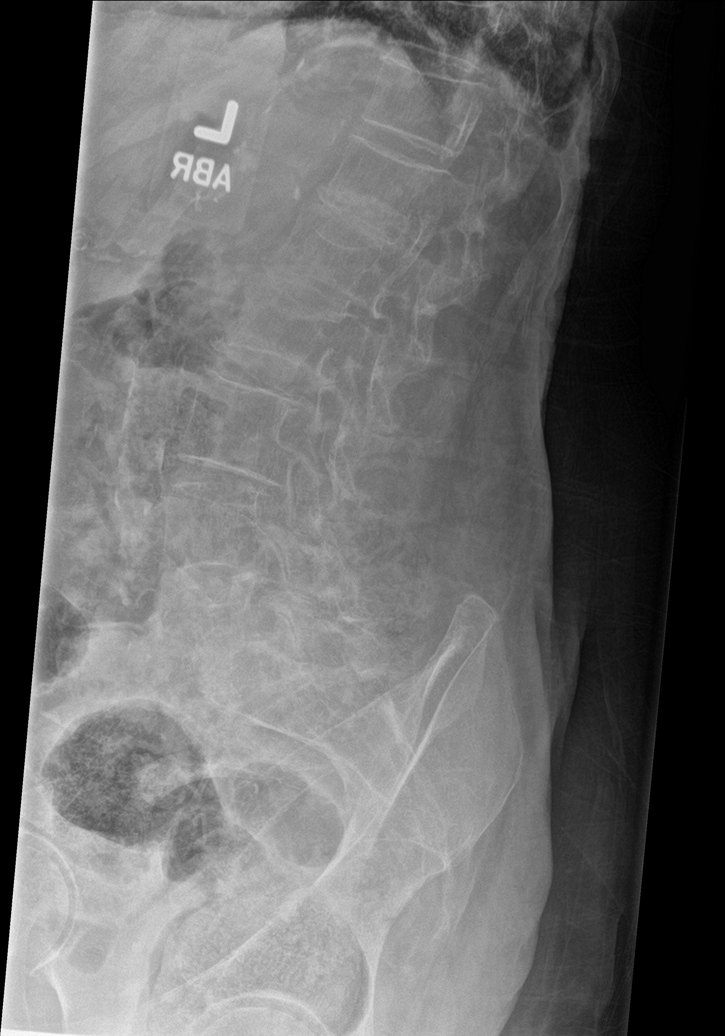

[l-spine spot]
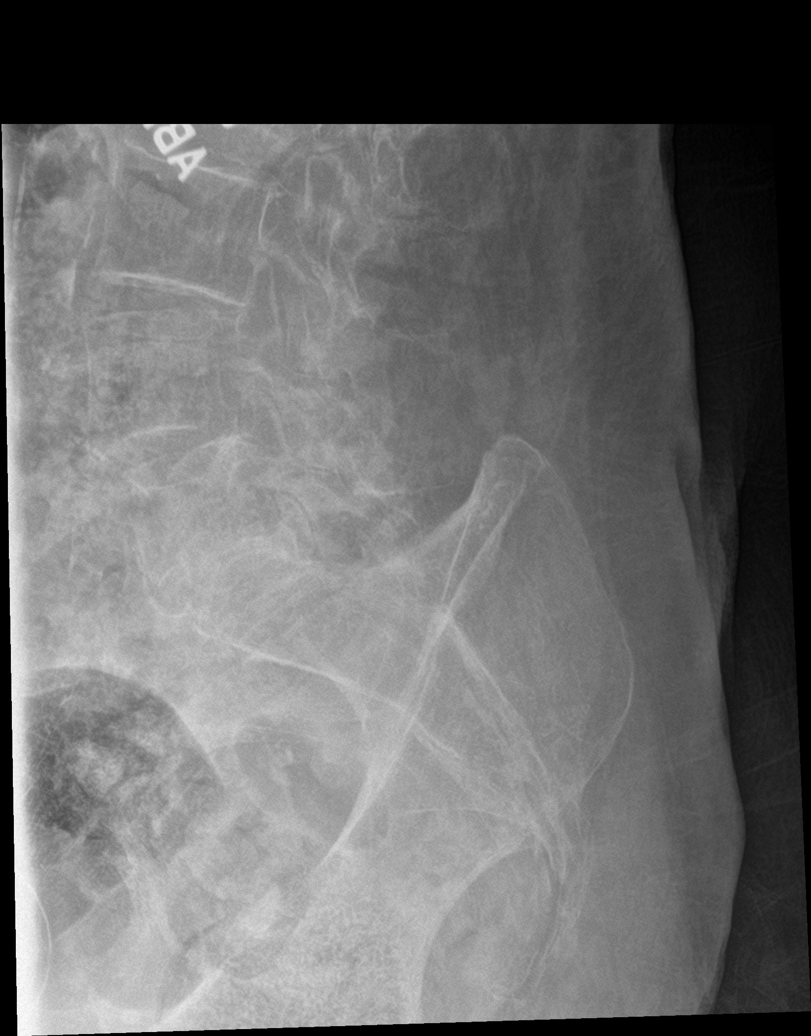

[l-spine lat (2 of 2)]
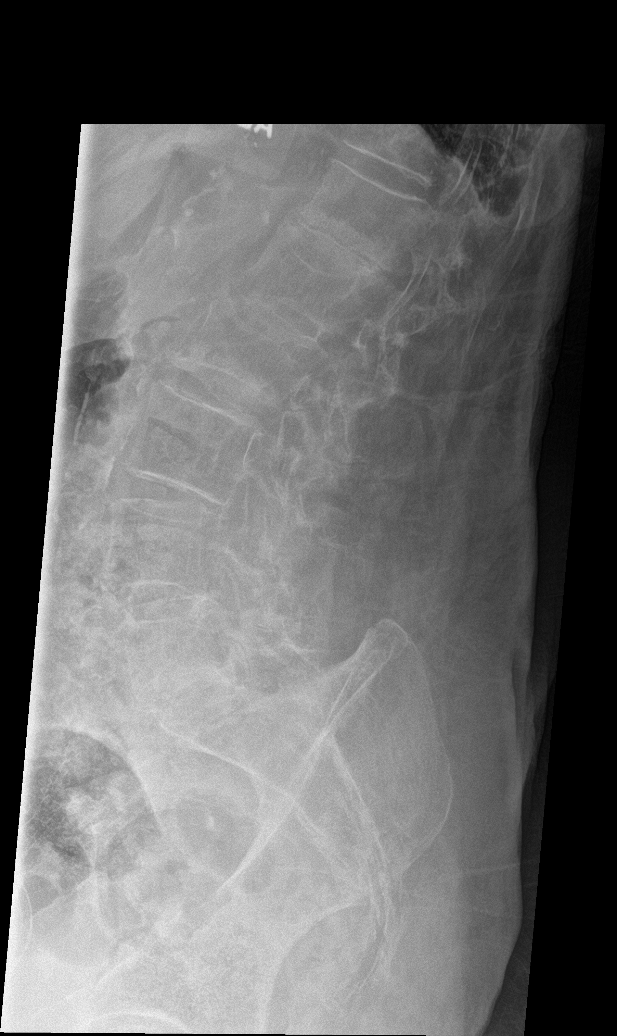

[6 of 6 positions shown; findings below may reference images not displayed]

FINDINGS: Five non-rib-bearing lumbar vertebrae. Mild scoliosis. Diffuse
osteopenia, making visualization of the bone detail more difficult.
Facet degenerative changes throughout the lumbar spine with
associated grade 1 retrolisthesis at the L3-4 level. There is an
approximately 20% L2 vertebral inferior endplate compression
deformity with sclerosis and no visible fracture lines. No pars
defects are seen. Atheromatous arterial calcifications. Prominent
stool.
IMPRESSION: 1. Old approximately 20% L2 inferior endplate compression fracture.
No acute fracture seen.
2. Facet degenerative changes throughout the lumbar spine with
associated grade 1 retrolisthesis at the L3-4 level.
3. Prominent stool.

## 2020-11-16 ENCOUNTER — Other Ambulatory Visit: Payer: Self-pay | Admitting: Cardiology

## 2021-01-11 ENCOUNTER — Other Ambulatory Visit: Payer: Self-pay | Admitting: Cardiology

## 2021-02-08 ENCOUNTER — Other Ambulatory Visit: Payer: Self-pay | Admitting: Cardiology

## 2021-03-31 ENCOUNTER — Ambulatory Visit: Payer: PPO | Admitting: Cardiology

## 2021-03-31 ENCOUNTER — Encounter: Payer: Self-pay | Admitting: Cardiology

## 2021-03-31 VITALS — BP 112/82 | HR 73 | Ht 61.0 in | Wt 114.0 lb

## 2021-03-31 DIAGNOSIS — I25119 Atherosclerotic heart disease of native coronary artery with unspecified angina pectoris: Secondary | ICD-10-CM | POA: Diagnosis not present

## 2021-03-31 DIAGNOSIS — I255 Ischemic cardiomyopathy: Secondary | ICD-10-CM

## 2021-03-31 DIAGNOSIS — I10 Essential (primary) hypertension: Secondary | ICD-10-CM

## 2021-03-31 NOTE — Patient Instructions (Addendum)

## 2021-03-31 NOTE — Progress Notes (Signed)
Cardiology Office Note  Date: 03/31/2021   ID: [name], DOB Dec 18, 1928, MRN [id number]  PCP:  [name], MD  Cardiologist:  Rozann Lesches, MD Electrophysiologist:  None   Chief Complaint  Patient presents with  . Cardiac follow-up    History of Present Illness: Debra Doyle is a 87 y.o. female last assessed via telehealth encounter in March 2021.  She is here today with her son and daughter-in-law.  She is in a wheelchair, not overly active at baseline.  She does read regularly.  Her son states that she has had trouble with recurring coughing, sometimes with meals suggesting the possibility of aspiration.  She has a visit scheduled to see her PCP Dr. Manuella Ghazi.  She does not describe any obvious angina symptoms, no nitroglycerin use.  I reviewed her cardiac regimen which is stable and outlined below.  Blood pressure is very well controlled today.  I personally reviewed her ECG which shows sinus rhythm with incomplete left bundle branch block.  Past Medical History:  Diagnosis Date  . Coronary atherosclerosis of native coronary artery    a. s/p DES x4 to LAD in 2012, ISR in 11/2013 with thrombectomy and angioplasty alone of LAD  . Essential hypertension   . Fibrocystic breast disease   . Hx of colonic polyps   . Ischemic cardiomyopathy   . Myocardial infarction [facility])    NSTEMI 1/12  . ST elevation (STEMI) myocardial infarction involving left anterior descending coronary artery [facility])    January 2015 - LAD stent thrombosis off DAPT    Past Surgical History:  Procedure Laterality Date  . CATARACT EXTRACTION, BILATERAL     Bilateral  . ESOPHAGOGASTRODUODENOSCOPY N/A 01/29/2019   Procedure: ESOPHAGOGASTRODUODENOSCOPY (EGD);  Surgeon: Rogene Houston, MD;  Location: [facility]O [facility];  Service: Endoscopy;  Laterality: N/A;  . LEFT HEART CATHETERIZATION WITH CORONARY ANGIOGRAM N/A 11/25/2013   Procedure: LEFT HEART CATHETERIZATION WITH CORONARY ANGIOGRAM;  Surgeon: Sinclair Grooms, MD;  Location: [facility] CATH LAB;  Service: Cardiovascular;  Laterality: N/A;  . PERCUTANEOUS CORONARY STENT INTERVENTION (PCI-S)  11/25/2013   Procedure: PERCUTANEOUS CORONARY STENT INTERVENTION (PCI-S);  Surgeon: Sinclair Grooms, MD;  Location: [facility] CATH LAB;  Service: Cardiovascular;;    Current Outpatient Medications  Medication Sig Dispense Refill  . acetaminophen (TYLENOL) 325 MG tablet Take 2 tablets (650 mg total) by mouth every 6 (six) hours as needed for fever or headache (pain).    . Ascorbic Acid (VITAMIN C) 500 MG tablet Take 500 mg by mouth daily.    Marland Kitchen aspirin EC 81 MG tablet Take 1 tablet (81 mg total) by mouth daily.    Marland Kitchen atorvastatin (LIPITOR) 40 MG tablet TAKE 1 TABLET BY MOUTH EVERY DAY AT 6:00 PM 90 tablet 1  . carvedilol (COREG) 6.25 MG tablet TAKE 1 TABLET BY MOUTH TWICE DAILY 180 tablet 1  . cyanocobalamin 1000 MCG tablet Take 1,000 mcg by mouth daily.    . furosemide (LASIX) 40 MG tablet Take 40 mg by mouth daily.     Marland Kitchen losartan (COZAAR) 50 MG tablet TAKE 1 TABLET BY MOUTH EVERY DAY 90 tablet 3  . LUTEIN PO Take 1 capsule by mouth daily.    . nitroGLYCERIN (NITROSTAT) 0.4 MG SL tablet Place 0.4 mg under the tongue as needed for chest pain. Place one tablet under the tongue every 5 minutes x 3 doses max as needed for chest pain.  Contact MD or 911 with unrelieved pain    .  pantoprazole (PROTONIX) 40 MG tablet Take 40 mg by mouth every evening.    . senna (SENOKOT) 8.6 MG TABS tablet Take 1 tablet by mouth daily as needed for mild constipation.    Marland Kitchen Specialty Vitamins Products (BILBERRY PLUS PO) Take 1 capsule by mouth daily.    Marland Kitchen spironolactone (ALDACTONE) 25 MG tablet TAKE 1/2 TABLET BY MOUTH EVERY DAY 45 tablet 1   No current facility-administered medications for this visit.   Allergies:  Patient has no known allergies.   ROS: No palpitations or syncope.  Physical Exam: VS:  BP 112/82   Pulse 73   Ht 5\' 1"  (1.549 m)   Wt 114 lb (51.7 kg)   SpO2 98%   BMI  21.54 kg/m , BMI Body mass index is 21.54 kg/m.  Wt Readings from Last 3 Encounters:  03/31/21 114 lb (51.7 kg)  01/29/20 125 lb (56.7 kg)  09/03/19 131 lb (59.4 kg)    General: Elderly woman, appears comfortable at rest. HEENT: Conjunctiva and lids normal, wearing a mask. Neck: Supple, no elevated JVP or carotid bruits, no thyromegaly. Lungs: Clear to auscultation, nonlabored breathing at rest. Cardiac: Regular rate and rhythm, no S3, 1/6 systolic murmur, no pericardial rub. Extremities: No pitting edema.  ECG:  An ECG dated 06/10/2018 was personally reviewed today and demonstrated:  Sinus rhythm with left bundle branch block.  Recent Labwork:  April 2021: Potassium 3.6, BUN 30, creatinine 0.92  Other Studies Reviewed Today:  Echocardiogram 01/05/2016: Study Conclusions  - Left ventricle: The cavity size was normal. Wall thickness was increased in a pattern of mild LVH. Systolic function was normal. The estimated ejection fraction was in the range of 50% to 55%. Doppler parameters are consistent with abnormal left ventricular relaxation (grade 1 diastolic dysfunction). - Aortic valve: Valve area (VTI): 2.54 cm^2. Valve area (Vmax): 2.54 cm^2. Valve area (Vmean): 2.75 cm^2. - Technically difficult study.  Assessment and Plan:  1.  CAD status post DES x4 to the LAD in 2012 with subsequent stent thrombosis in 2015.  She has been clinically stable since that time, no active angina symptoms.  ECG reviewed and also stable.  Continue aspirin, Coreg, Lipitor, losartan, Aldactone, and as needed nitroglycerin.  2.  History of ischemic cardiomyopathy with LVEF 50 to 55% by last assessment.  3.  Essential hypertension, blood pressure is well controlled today on current regimen.  Medication Adjustments/Labs and Tests Ordered: Current medicines are reviewed at length with the patient today.  Concerns regarding medicines are outlined above.   Tests Ordered: Orders Placed  This Encounter  Procedures  . EKG 12-Lead    Medication Changes: No orders of the defined types were placed in this encounter.   Disposition:  Follow up 1 year.  Signed, Satira Sark, MD, [facility] 03/31/2021 3:43 PM    [facility] [place], [place], [place] [address] Phone: [phone]; Fax: [phone]

## 2021-04-27 DIAGNOSIS — I10 Essential (primary) hypertension: Secondary | ICD-10-CM | POA: Diagnosis not present

## 2021-04-27 DIAGNOSIS — Z299 Encounter for prophylactic measures, unspecified: Secondary | ICD-10-CM | POA: Diagnosis not present

## 2021-04-27 DIAGNOSIS — I25119 Atherosclerotic heart disease of native coronary artery with unspecified angina pectoris: Secondary | ICD-10-CM | POA: Diagnosis not present

## 2021-04-27 DIAGNOSIS — D6869 Other thrombophilia: Secondary | ICD-10-CM | POA: Diagnosis not present

## 2021-04-27 DIAGNOSIS — R0789 Other chest pain: Secondary | ICD-10-CM | POA: Diagnosis not present

## 2021-06-09 ENCOUNTER — Encounter: Payer: Self-pay | Admitting: Cardiology

## 2021-06-09 DIAGNOSIS — R5383 Other fatigue: Secondary | ICD-10-CM | POA: Diagnosis not present

## 2021-06-09 DIAGNOSIS — Z6820 Body mass index (BMI) 20.0-20.9, adult: Secondary | ICD-10-CM | POA: Diagnosis not present

## 2021-06-09 DIAGNOSIS — I25119 Atherosclerotic heart disease of native coronary artery with unspecified angina pectoris: Secondary | ICD-10-CM | POA: Diagnosis not present

## 2021-06-09 DIAGNOSIS — I10 Essential (primary) hypertension: Secondary | ICD-10-CM | POA: Diagnosis not present

## 2021-06-09 DIAGNOSIS — Z0000 Encounter for general adult medical examination without abnormal findings: Secondary | ICD-10-CM | POA: Diagnosis not present

## 2021-06-09 DIAGNOSIS — E7800 Pure hypercholesterolemia, unspecified: Secondary | ICD-10-CM | POA: Diagnosis not present

## 2021-06-09 DIAGNOSIS — Z1331 Encounter for screening for depression: Secondary | ICD-10-CM | POA: Diagnosis not present

## 2021-06-09 DIAGNOSIS — Z79899 Other long term (current) drug therapy: Secondary | ICD-10-CM | POA: Diagnosis not present

## 2021-06-09 DIAGNOSIS — Z299 Encounter for prophylactic measures, unspecified: Secondary | ICD-10-CM | POA: Diagnosis not present

## 2021-06-09 DIAGNOSIS — Z1339 Encounter for screening examination for other mental health and behavioral disorders: Secondary | ICD-10-CM | POA: Diagnosis not present

## 2021-06-09 DIAGNOSIS — Z7189 Other specified counseling: Secondary | ICD-10-CM | POA: Diagnosis not present

## 2021-06-23 ENCOUNTER — Telehealth: Payer: Self-pay | Admitting: Cardiology

## 2021-06-23 NOTE — Telephone Encounter (Signed)
Notified Ronald Lobo, pharm of message below.

## 2021-06-23 NOTE — Telephone Encounter (Signed)
Per phone call from [facility] Pt is taking both Spironolactone and Furosemide would like to know if they should discontinue the Spironolactone.   Please call  [phone] ext [phone]

## 2021-06-23 NOTE — Telephone Encounter (Signed)
   Covering for Dr. Domenic Polite - By review of notes, she should be on both Lasix and Spironolactone. Kidney function was stable by recent labs in 05/2021.  Signed, Erma Heritage, PA-C 06/23/2021, 4:16 PM

## 2021-06-24 ENCOUNTER — Other Ambulatory Visit: Payer: Self-pay

## 2021-06-24 ENCOUNTER — Inpatient Hospital Stay (HOSPITAL_COMMUNITY)
Admission: EM | Admit: 2021-06-24 | Discharge: 2021-07-06 | DRG: 384 | Disposition: A | Discharge status: Hospice | Payer: PPO | Attending: Internal Medicine | Admitting: Internal Medicine

## 2021-06-24 ENCOUNTER — Emergency Department (HOSPITAL_COMMUNITY): Payer: PPO

## 2021-06-24 ENCOUNTER — Encounter (HOSPITAL_COMMUNITY): Payer: Self-pay | Admitting: *Deleted

## 2021-06-24 DIAGNOSIS — Z4682 Encounter for fitting and adjustment of non-vascular catheter: Secondary | ICD-10-CM | POA: Diagnosis not present

## 2021-06-24 DIAGNOSIS — J181 Lobar pneumonia, unspecified organism: Secondary | ICD-10-CM | POA: Diagnosis not present

## 2021-06-24 DIAGNOSIS — I252 Old myocardial infarction: Secondary | ICD-10-CM

## 2021-06-24 DIAGNOSIS — R54 Age-related physical debility: Secondary | ICD-10-CM | POA: Diagnosis present

## 2021-06-24 DIAGNOSIS — K920 Hematemesis: Secondary | ICD-10-CM | POA: Diagnosis not present

## 2021-06-24 DIAGNOSIS — E872 Acidosis, unspecified: Secondary | ICD-10-CM

## 2021-06-24 DIAGNOSIS — E860 Dehydration: Secondary | ICD-10-CM | POA: Diagnosis not present

## 2021-06-24 DIAGNOSIS — E441 Mild protein-calorie malnutrition: Secondary | ICD-10-CM | POA: Diagnosis not present

## 2021-06-24 DIAGNOSIS — K259 Gastric ulcer, unspecified as acute or chronic, without hemorrhage or perforation: Secondary | ICD-10-CM | POA: Diagnosis not present

## 2021-06-24 DIAGNOSIS — D490 Neoplasm of unspecified behavior of digestive system: Secondary | ICD-10-CM | POA: Diagnosis not present

## 2021-06-24 DIAGNOSIS — K56699 Other intestinal obstruction unspecified as to partial versus complete obstruction: Secondary | ICD-10-CM | POA: Diagnosis not present

## 2021-06-24 DIAGNOSIS — K219 Gastro-esophageal reflux disease without esophagitis: Secondary | ICD-10-CM | POA: Diagnosis present

## 2021-06-24 DIAGNOSIS — I2510 Atherosclerotic heart disease of native coronary artery without angina pectoris: Secondary | ICD-10-CM | POA: Diagnosis not present

## 2021-06-24 DIAGNOSIS — N179 Acute kidney failure, unspecified: Secondary | ICD-10-CM | POA: Diagnosis not present

## 2021-06-24 DIAGNOSIS — Z20822 Contact with and (suspected) exposure to covid-19: Secondary | ICD-10-CM | POA: Diagnosis present

## 2021-06-24 DIAGNOSIS — Z6821 Body mass index (BMI) 21.0-21.9, adult: Secondary | ICD-10-CM | POA: Diagnosis not present

## 2021-06-24 DIAGNOSIS — R0602 Shortness of breath: Secondary | ICD-10-CM | POA: Diagnosis not present

## 2021-06-24 DIAGNOSIS — K30 Functional dyspepsia: Secondary | ICD-10-CM | POA: Diagnosis present

## 2021-06-24 DIAGNOSIS — K567 Ileus, unspecified: Secondary | ICD-10-CM | POA: Diagnosis not present

## 2021-06-24 DIAGNOSIS — K319 Disease of stomach and duodenum, unspecified: Secondary | ICD-10-CM | POA: Diagnosis not present

## 2021-06-24 DIAGNOSIS — K3189 Other diseases of stomach and duodenum: Secondary | ICD-10-CM

## 2021-06-24 DIAGNOSIS — K5900 Constipation, unspecified: Secondary | ICD-10-CM

## 2021-06-24 DIAGNOSIS — Z515 Encounter for palliative care: Secondary | ICD-10-CM | POA: Diagnosis not present

## 2021-06-24 DIAGNOSIS — E876 Hypokalemia: Secondary | ICD-10-CM | POA: Diagnosis present

## 2021-06-24 DIAGNOSIS — R1084 Generalized abdominal pain: Secondary | ICD-10-CM

## 2021-06-24 DIAGNOSIS — I82A12 Acute embolism and thrombosis of left axillary vein: Secondary | ICD-10-CM | POA: Diagnosis not present

## 2021-06-24 DIAGNOSIS — I82622 Acute embolism and thrombosis of deep veins of left upper extremity: Secondary | ICD-10-CM | POA: Diagnosis not present

## 2021-06-24 DIAGNOSIS — I259 Chronic ischemic heart disease, unspecified: Secondary | ICD-10-CM | POA: Diagnosis not present

## 2021-06-24 DIAGNOSIS — I959 Hypotension, unspecified: Secondary | ICD-10-CM | POA: Diagnosis not present

## 2021-06-24 DIAGNOSIS — I82B12 Acute embolism and thrombosis of left subclavian vein: Secondary | ICD-10-CM | POA: Diagnosis not present

## 2021-06-24 DIAGNOSIS — Z955 Presence of coronary angioplasty implant and graft: Secondary | ICD-10-CM | POA: Diagnosis not present

## 2021-06-24 DIAGNOSIS — I517 Cardiomegaly: Secondary | ICD-10-CM | POA: Diagnosis not present

## 2021-06-24 DIAGNOSIS — Q438 Other specified congenital malformations of intestine: Secondary | ICD-10-CM | POA: Diagnosis not present

## 2021-06-24 DIAGNOSIS — R140 Abdominal distension (gaseous): Secondary | ICD-10-CM | POA: Diagnosis not present

## 2021-06-24 DIAGNOSIS — Z79899 Other long term (current) drug therapy: Secondary | ICD-10-CM

## 2021-06-24 DIAGNOSIS — R739 Hyperglycemia, unspecified: Secondary | ICD-10-CM | POA: Diagnosis not present

## 2021-06-24 DIAGNOSIS — K6289 Other specified diseases of anus and rectum: Secondary | ICD-10-CM | POA: Diagnosis not present

## 2021-06-24 DIAGNOSIS — D123 Benign neoplasm of transverse colon: Secondary | ICD-10-CM | POA: Diagnosis not present

## 2021-06-24 DIAGNOSIS — R109 Unspecified abdominal pain: Secondary | ICD-10-CM

## 2021-06-24 DIAGNOSIS — I10 Essential (primary) hypertension: Secondary | ICD-10-CM | POA: Diagnosis not present

## 2021-06-24 DIAGNOSIS — Z8711 Personal history of peptic ulcer disease: Secondary | ICD-10-CM

## 2021-06-24 DIAGNOSIS — S22070A Wedge compression fracture of T9-T10 vertebra, initial encounter for closed fracture: Secondary | ICD-10-CM | POA: Diagnosis not present

## 2021-06-24 DIAGNOSIS — K5939 Other megacolon: Secondary | ICD-10-CM | POA: Diagnosis not present

## 2021-06-24 DIAGNOSIS — Z66 Do not resuscitate: Secondary | ICD-10-CM | POA: Diagnosis not present

## 2021-06-24 DIAGNOSIS — Q433 Congenital malformations of intestinal fixation: Secondary | ICD-10-CM | POA: Diagnosis not present

## 2021-06-24 DIAGNOSIS — R935 Abnormal findings on diagnostic imaging of other abdominal regions, including retroperitoneum: Secondary | ICD-10-CM | POA: Diagnosis not present

## 2021-06-24 DIAGNOSIS — I110 Hypertensive heart disease with heart failure: Secondary | ICD-10-CM | POA: Diagnosis present

## 2021-06-24 DIAGNOSIS — R933 Abnormal findings on diagnostic imaging of other parts of digestive tract: Secondary | ICD-10-CM | POA: Diagnosis not present

## 2021-06-24 DIAGNOSIS — Z7982 Long term (current) use of aspirin: Secondary | ICD-10-CM

## 2021-06-24 DIAGNOSIS — T18198A Other foreign object in esophagus causing other injury, initial encounter: Secondary | ICD-10-CM | POA: Diagnosis not present

## 2021-06-24 DIAGNOSIS — E878 Other disorders of electrolyte and fluid balance, not elsewhere classified: Secondary | ICD-10-CM | POA: Diagnosis not present

## 2021-06-24 DIAGNOSIS — R059 Cough, unspecified: Secondary | ICD-10-CM | POA: Diagnosis not present

## 2021-06-24 DIAGNOSIS — R188 Other ascites: Secondary | ICD-10-CM | POA: Diagnosis not present

## 2021-06-24 DIAGNOSIS — Z7189 Other specified counseling: Secondary | ICD-10-CM | POA: Diagnosis not present

## 2021-06-24 DIAGNOSIS — D128 Benign neoplasm of rectum: Secondary | ICD-10-CM | POA: Diagnosis present

## 2021-06-24 DIAGNOSIS — J90 Pleural effusion, not elsewhere classified: Secondary | ICD-10-CM | POA: Diagnosis not present

## 2021-06-24 DIAGNOSIS — Z8719 Personal history of other diseases of the digestive system: Secondary | ICD-10-CM

## 2021-06-24 DIAGNOSIS — Z8249 Family history of ischemic heart disease and other diseases of the circulatory system: Secondary | ICD-10-CM

## 2021-06-24 DIAGNOSIS — Z825 Family history of asthma and other chronic lower respiratory diseases: Secondary | ICD-10-CM

## 2021-06-24 DIAGNOSIS — E871 Hypo-osmolality and hyponatremia: Secondary | ICD-10-CM | POA: Diagnosis not present

## 2021-06-24 DIAGNOSIS — M40209 Unspecified kyphosis, site unspecified: Secondary | ICD-10-CM | POA: Diagnosis present

## 2021-06-24 DIAGNOSIS — I509 Heart failure, unspecified: Secondary | ICD-10-CM | POA: Diagnosis not present

## 2021-06-24 DIAGNOSIS — Z823 Family history of stroke: Secondary | ICD-10-CM

## 2021-06-24 DIAGNOSIS — R600 Localized edema: Secondary | ICD-10-CM | POA: Diagnosis not present

## 2021-06-24 DIAGNOSIS — Z4659 Encounter for fitting and adjustment of other gastrointestinal appliance and device: Secondary | ICD-10-CM

## 2021-06-24 DIAGNOSIS — R609 Edema, unspecified: Secondary | ICD-10-CM

## 2021-06-24 DIAGNOSIS — K635 Polyp of colon: Secondary | ICD-10-CM | POA: Diagnosis present

## 2021-06-24 DIAGNOSIS — I255 Ischemic cardiomyopathy: Secondary | ICD-10-CM | POA: Diagnosis present

## 2021-06-24 DIAGNOSIS — K6389 Other specified diseases of intestine: Secondary | ICD-10-CM | POA: Diagnosis not present

## 2021-06-24 DIAGNOSIS — D125 Benign neoplasm of sigmoid colon: Secondary | ICD-10-CM | POA: Diagnosis not present

## 2021-06-24 LAB — COMPREHENSIVE METABOLIC PANEL
ALT: 19 U/L (ref 0–44)
AST: 28 U/L (ref 15–41)
Albumin: 3.4 g/dL — ABNORMAL LOW (ref 3.5–5.0)
Alkaline Phosphatase: 110 U/L (ref 38–126)
Anion gap: 12 (ref 5–15)
BUN: 27 mg/dL — ABNORMAL HIGH (ref 8–23)
CO2: 27 mmol/L (ref 22–32)
Calcium: 8.6 mg/dL — ABNORMAL LOW (ref 8.9–10.3)
Chloride: 85 mmol/L — ABNORMAL LOW (ref 98–111)
Creatinine, Ser: 1.25 mg/dL — ABNORMAL HIGH (ref 0.44–1.00)
GFR, Estimated: 41 mL/min — ABNORMAL LOW (ref >60)
Glucose, Bld: 188 mg/dL — ABNORMAL HIGH (ref 70–99)
Potassium: 4.1 mmol/L (ref 3.5–5.1)
Sodium: 124 mmol/L — ABNORMAL LOW (ref 135–145)
Total Bilirubin: 1 mg/dL (ref 0.3–1.2)
Total Protein: 6.5 g/dL (ref 6.5–8.1)

## 2021-06-24 LAB — CBC WITH DIFFERENTIAL/PLATELET
Abs Immature Granulocytes: 0.07 10*3/uL (ref 0.00–0.07)
Basophils Absolute: 0 10*3/uL (ref 0.0–0.1)
Basophils Relative: 0 %
Eosinophils Absolute: 0.1 10*3/uL (ref 0.0–0.5)
Eosinophils Relative: 1 %
HCT: 40.3 % (ref 36.0–46.0)
Hemoglobin: 14.1 g/dL (ref 12.0–15.0)
Immature Granulocytes: 1 %
Lymphocytes Relative: 9 %
Lymphs Abs: 1 10*3/uL (ref 0.7–4.0)
MCH: 33.6 pg (ref 26.0–34.0)
MCHC: 35 g/dL (ref 30.0–36.0)
MCV: 96 fL (ref 80.0–100.0)
Monocytes Absolute: 0.6 10*3/uL (ref 0.1–1.0)
Monocytes Relative: 5 %
Neutro Abs: 9.4 10*3/uL — ABNORMAL HIGH (ref 1.7–7.7)
Neutrophils Relative %: 84 %
Platelets: 198 10*3/uL (ref 150–400)
RBC: 4.2 MIL/uL (ref 3.87–5.11)
RDW: 13.3 % (ref 11.5–15.5)
WBC: 11.1 10*3/uL — ABNORMAL HIGH (ref 4.0–10.5)
nRBC: 0 % (ref 0.0–0.2)

## 2021-06-24 LAB — LIPASE, BLOOD: Lipase: 64 U/L — ABNORMAL HIGH (ref 11–51)

## 2021-06-24 LAB — LACTIC ACID, PLASMA
Lactic Acid, Venous: 2.8 mmol/L (ref 0.5–1.9)
Lactic Acid, Venous: 2.8 mmol/L (ref 0.5–1.9)

## 2021-06-24 LAB — APTT: aPTT: 27 seconds (ref 24–36)

## 2021-06-24 LAB — PROTIME-INR
INR: 1.2 (ref 0.8–1.2)
Prothrombin Time: 15.5 seconds — ABNORMAL HIGH (ref 11.4–15.2)

## 2021-06-24 MED ORDER — ACETAMINOPHEN 650 MG RE SUPP: 650.0000 mg | Freq: Four times a day (QID) | RECTAL | Status: DC | PRN

## 2021-06-24 MED ORDER — METRONIDAZOLE 500 MG/100ML IV SOLN
500.0000 mg | Freq: Three times a day (TID) | INTRAVENOUS | Status: DC
  Administered 2021-06-24 – 2021-06-27 (×9): 500 mg via INTRAVENOUS
  Filled 2021-06-24 (×9): qty 100

## 2021-06-24 MED ORDER — PANTOPRAZOLE SODIUM 40 MG IV SOLR
40.0000 mg | Freq: Two times a day (BID) | INTRAVENOUS | Status: DC
  Administered 2021-06-25 – 2021-07-03 (×18): 40 mg via INTRAVENOUS
  Filled 2021-06-24 (×19): qty 40

## 2021-06-24 MED ORDER — PANTOPRAZOLE SODIUM 40 MG IV SOLR
40.0000 mg | INTRAVENOUS | Status: AC
  Administered 2021-06-24: 40 mg via INTRAVENOUS
  Filled 2021-06-24: qty 40

## 2021-06-24 MED ORDER — BENZONATATE 100 MG PO CAPS: 200.0000 mg | ORAL_CAPSULE | Freq: Three times a day (TID) | ORAL | Status: DC | PRN

## 2021-06-24 MED ORDER — SODIUM CHLORIDE 0.9 % IV SOLN
2.0000 g | INTRAVENOUS | Status: DC
  Administered 2021-06-25 – 2021-06-26 (×2): 2 g via INTRAVENOUS
  Filled 2021-06-24 (×2): qty 2

## 2021-06-24 MED ORDER — SODIUM CHLORIDE 0.9 % IV BOLUS
500.0000 mL | Freq: Once | INTRAVENOUS | Status: AC
  Administered 2021-06-24: 500 mL via INTRAVENOUS

## 2021-06-24 MED ORDER — FENTANYL CITRATE PF 50 MCG/ML IJ SOSY
50.0000 ug | PREFILLED_SYRINGE | Freq: Once | INTRAMUSCULAR | Status: AC
  Administered 2021-06-24: 50 ug via INTRAVENOUS
  Filled 2021-06-24: qty 1

## 2021-06-24 MED ORDER — SODIUM CHLORIDE 0.9 % IV SOLN
2.0000 g | Freq: Once | INTRAVENOUS | Status: AC
  Administered 2021-06-24: 2 g via INTRAVENOUS
  Filled 2021-06-24: qty 2

## 2021-06-24 MED ORDER — SODIUM CHLORIDE 0.9 % IV SOLN: INTRAVENOUS | Status: DC

## 2021-06-24 MED ORDER — MORPHINE SULFATE (PF) 2 MG/ML IV SOLN
2.0000 mg | INTRAVENOUS | Status: DC | PRN
  Administered 2021-06-26 – 2021-06-29 (×3): 2 mg via INTRAVENOUS
  Filled 2021-06-24 (×4): qty 1

## 2021-06-24 MED ORDER — ONDANSETRON HCL 4 MG PO TABS: 4.0000 mg | ORAL_TABLET | Freq: Four times a day (QID) | ORAL | Status: DC | PRN

## 2021-06-24 MED ORDER — IOHEXOL 350 MG/ML SOLN
100.0000 mL | Freq: Once | INTRAVENOUS | Status: AC | PRN
  Administered 2021-06-24: 75 mL via INTRAVENOUS

## 2021-06-24 MED ORDER — VANCOMYCIN HCL IN DEXTROSE 1-5 GM/200ML-% IV SOLN
1000.0000 mg | Freq: Once | INTRAVENOUS | Status: AC
  Administered 2021-06-24: 1000 mg via INTRAVENOUS
  Filled 2021-06-24: qty 200

## 2021-06-24 MED ORDER — ACETAMINOPHEN 325 MG PO TABS: 650.0000 mg | ORAL_TABLET | Freq: Four times a day (QID) | ORAL | Status: DC | PRN

## 2021-06-24 MED ORDER — VANCOMYCIN HCL 500 MG/100ML IV SOLN
500.0000 mg | INTRAVENOUS | Status: AC
Start: 2021-06-25 — End: 2021-06-25
  Filled 2021-06-24 (×2): qty 100

## 2021-06-24 MED ORDER — LIDOCAINE 5 % EX PTCH
1.0000 | MEDICATED_PATCH | CUTANEOUS | Status: DC
  Administered 2021-06-25 – 2021-07-05 (×9): 1 via TRANSDERMAL
  Filled 2021-06-24 (×12): qty 1

## 2021-06-24 MED ORDER — ONDANSETRON HCL 4 MG/2ML IJ SOLN: 4.0000 mg | Freq: Four times a day (QID) | INTRAMUSCULAR | Status: DC | PRN

## 2021-06-24 MED ORDER — HEPARIN SODIUM (PORCINE) 5000 UNIT/ML IJ SOLN
5000.0000 [IU] | Freq: Three times a day (TID) | INTRAMUSCULAR | Status: DC
  Administered 2021-06-25 – 2021-06-26 (×5): 5000 [IU] via SUBCUTANEOUS
  Filled 2021-06-24 (×5): qty 1

## 2021-06-24 MED ORDER — SODIUM CHLORIDE 0.9 % IV BOLUS (SEPSIS)
1000.0000 mL | Freq: Once | INTRAVENOUS | Status: AC
  Administered 2021-06-24: 1000 mL via INTRAVENOUS

## 2021-06-24 NOTE — ED Triage Notes (Signed)
Pt with distention noted to abd.  Pt with hx of the same couple years back.

## 2021-06-24 NOTE — Progress Notes (Signed)
Pharmacy Antibiotic Note  Debra Doyle is a 87 y.o. female admitted on 06/24/2021 with sepsis.  Pharmacy has been consulted for cefepime/vancomycin dosing.  Plan: Vancomycin 1g loading dose followed by '500mg'$  IV every 24 hours.  Goal trough 15-20 mcg/mL. Cefepime 2g IV q24h based on renal function  Height: '5\' 1"'$  (154.9 cm) Weight: 51.7 kg (114 lb) IBW/kg (Calculated) : 47.8  Temp (24hrs), Avg:97.5 F (36.4 C), Min:97.5 F (36.4 C), Max:97.5 F (36.4 C)  Recent Labs  Lab 06/24/21 1711  WBC 11.1*  CREATININE 1.25*  LATICACIDVEN 2.8*    Estimated Creatinine Clearance: 22.6 mL/min (A) (by C-G formula based on SCr of 1.25 mg/dL (H)).    No Known Allergies  Antimicrobials this admission: metronidazole '500mg'$  IV q8h (8/12 >> )   Dose adjustments this admission: N/a  Microbiology results: 8/12 BCx: pending 8/12 UCx: pending   Thank you for allowing pharmacy to be a part of this patient's care.  Jordan Hawks Carleigh Buccieri 06/24/2021 6:57 PM

## 2021-06-24 NOTE — ED Triage Notes (Signed)
Pt with abd and back pain since Sunday. Pt not eating

## 2021-06-24 NOTE — ED Provider Notes (Signed)
Medical screening examination/treatment/procedure(s) were conducted as a shared visit with non-physician practitioner(s) and myself.  I personally evaluated the patient during the encounter.  Clinical Impression:   Final diagnoses:  Generalized abdominal pain  Hyponatremia  Lactic acidosis  Abdominal distension  Cough   I personally evaluated this patient and discussed the care with the patient and her son, all 10 review of systems were reviewed and negative except for those listed in the history of present illness.   Elderly 87 y/o female - hx of CHF, CAD, has has PUD - GERD and esophagitis - son is additional historian.  Hx of bleeding ulcers as well - abd is distended over the week.  Has had some vomiting.    Uncomfortable appearing, hypotensive, progressive back pain, pain with leaning forward - tympanitic distended and quiet abdomen.  Needs w/u to r/o SBO and or perforation or complication from prior abd problems.   Pain meds Fluids for hypotension  Ultimately this patient was found to have massive distention of the stomach, no obvious gastric outlet obstruction, multiple lab abnormalities, consultation with Dr. Abbey Chatters with gastroenterology and admission to the hospitalist.  Hyponatremia, severe gastric abnormalities, patient is critically ill requiring NG tube and admission, discussed with hospitalist who will admit  .Critical Care  Date/Time: 06/25/2021 11:59 AM Performed by: Noemi Chapel, MD Authorized by: Noemi Chapel, MD   Critical care provider statement:    Critical care time (minutes):  35   Critical care time was exclusive of:  Separately billable procedures and treating other patients and teaching time   Critical care was necessary to treat or prevent imminent or life-threatening deterioration of the following conditions:  Endocrine crisis (hyponatremia)   Critical care was time spent personally by me on the following activities:  Blood draw for specimens,  development of treatment plan with patient or surrogate, discussions with consultants, evaluation of patient's response to treatment, examination of patient, obtaining history from patient or surrogate, ordering and performing treatments and interventions, ordering and review of laboratory studies, ordering and review of radiographic studies, pulse oximetry, re-evaluation of patient's condition and review of old charts    Noemi Chapel, MD 06/25/21 1200

## 2021-06-24 NOTE — ED Notes (Signed)
Unable to obtain O2 Sat in triage

## 2021-06-24 NOTE — ED Notes (Signed)
Date and time results received: 06/24/21 6:01 PM  Test: lactic acid Critical Value: 2.8  Name of Provider Notified: christian,PA  Orders Received? Or Actions Taken?: n/a

## 2021-06-24 NOTE — ED Provider Notes (Signed)
[facility] EMERGENCY DEPARTMENT Provider Note   CSN: [id number] Arrival date & time: 06/24/21  1544     History Chief Complaint  Patient presents with   Abdominal Pain    Debra Doyle is a 87 y.o. female with a past medical history of CHF, CAD, PUD, GERD and Esophagitis who presents with 5 days of increasing abdominal bloating, and progressive mid to lower back pain. Some of history was gathered in conjunction with patient's son who is present at bedside. The patient reports she has had some trouble eating, with regurgitated undigested food, but without nausea. Patient reports that bloating is worsening today. Patient has been able to keep down some food as recently as this morning however. Patient reports she has had bowel movements, and has not had difficulty urinating. Patient describes the pain in her back as severe. Patient has been compliant with her medications, denies use of blood thinner, denies allergies.  Abdominal Pain Associated symptoms: vomiting   Associated symptoms: no chest pain, no nausea and no sore throat       Past Medical History:  Diagnosis Date   Coronary atherosclerosis of native coronary artery    a. s/p DES x4 to LAD in 2012, ISR in 11/2013 with thrombectomy and angioplasty alone of LAD   Essential hypertension    Fibrocystic breast disease    Hx of colonic polyps    Ischemic cardiomyopathy    Myocardial infarction [facility])    NSTEMI 1/12   ST elevation (STEMI) myocardial infarction involving left anterior descending coronary artery [facility])    January 2015 - LAD stent thrombosis off DAPT    Patient Active Problem List   Diagnosis Date Noted   Gastric distention 06/24/2021   Bilateral leg edema 02/09/2019   Compression fracture of L2 lumbar vertebra (HCC)    Dehydration    Gastroesophageal reflux disease with esophagitis    PUD (peptic ulcer disease)    AKI (acute kidney injury) ([place]) 01/28/2019   Closed fracture of third metatarsal bone of right  foot 11/26/17 01/21/2018   Closed nondisplaced fracture of proximal phalanx of right little finger 01/21/2018   Ischemic cardiomyopathy 01/09/2014   Mixed hyperlipidemia 12/29/2010   Benign essential HTN 12/29/2010   Coronary atherosclerosis of native coronary artery 12/29/2010    Past Surgical History:  Procedure Laterality Date   CATARACT EXTRACTION, BILATERAL     Bilateral   ESOPHAGOGASTRODUODENOSCOPY N/A 01/29/2019   Procedure: ESOPHAGOGASTRODUODENOSCOPY (EGD);  Surgeon: Rogene Houston, MD;  Location: [facility] ENDO [facility]ITE;  Service: Endoscopy;  Laterality: N/A;   LEFT HEART CATHETERIZATION WITH CORONARY ANGIOGRAM N/A 11/25/2013   Procedure: LEFT HEART CATHETERIZATION WITH CORONARY ANGIOGRAM;  Surgeon: Sinclair Grooms, MD;  Location: [facility] CATH LAB;  Service: Cardiovascular;  Laterality: N/A;   PERCUTANEOUS CORONARY STENT INTERVENTION (PCI-S)  11/25/2013   Procedure: PERCUTANEOUS CORONARY STENT INTERVENTION (PCI-S);  Surgeon: Sinclair Grooms, MD;  Location: [facility] CATH LAB;  Service: Cardiovascular;;     OB History   No obstetric history on file.     Family History  Problem Relation Age of Onset   Coronary artery disease Mother    Emphysema Father    Cancer Sister        1 sister/cancer type unknown   Stroke Brother        1 brother    Social History   Tobacco Use   Smoking status: Never   Smokeless tobacco: Never  Vaping Use   Vaping Use: Never used  Substance Use Topics   Alcohol use: No   Drug use: No    Home Medications Prior to Admission medications   Medication Sig Start Date End Date Taking? Authorizing Provider  acetaminophen (TYLENOL) 325 MG tablet Take 2 tablets (650 mg total) by mouth every 6 (six) hours as needed for fever or headache (pain). 01/31/19  Yes Barton Dubois, MD  Ascorbic Acid (VITAMIN C) 500 MG tablet Take 500 mg by mouth daily.   Yes [provider]  aspirin EC 81 MG tablet Take 1 tablet (81 mg total) by mouth daily. 02/04/19  Yes Barton Dubois, MD  atorvastatin (LIPITOR) 40 MG tablet TAKE 1 TABLET BY MOUTH EVERY DAY AT 6:00 PM Patient taking differently: Take 40 mg by mouth daily. 02/08/21  Yes Satira Sark, MD  carvedilol (COREG) 6.25 MG tablet TAKE 1 TABLET BY MOUTH TWICE DAILY 02/08/21  Yes Satira Sark, MD  cyanocobalamin 1000 MCG tablet Take 1,000 mcg by mouth daily.   Yes [provider]  furosemide (LASIX) 40 MG tablet Take 40 mg by mouth daily.  02/10/19  Yes [provider]  ketoconazole (NIZORAL) 2 % cream Apply 1 application topically 2 (two) times daily. 06/09/21  Yes [provider]  losartan (COZAAR) 50 MG tablet TAKE 1 TABLET BY MOUTH EVERY DAY 11/16/20  Yes Satira Sark, MD  LUTEIN PO Take 1 capsule by mouth daily.   Yes [provider]  nitroGLYCERIN (NITROSTAT) 0.4 MG SL tablet Place 0.4 mg under the tongue as needed for chest pain. Place one tablet under the tongue every 5 minutes x 3 doses max as needed for chest pain.  Contact MD or 911 with unrelieved pain   Yes [provider]  pantoprazole (PROTONIX) 40 MG tablet Take 40 mg by mouth daily.   Yes [provider]  senna (SENOKOT) 8.6 MG TABS tablet Take 1 tablet by mouth daily as needed for mild constipation.   Yes [provider]  Specialty Vitamins Products (BILBERRY PLUS PO) Take 1 capsule by mouth daily.   Yes [provider]  spironolactone (ALDACTONE) 25 MG tablet TAKE 1/2 TABLET BY MOUTH EVERY DAY 02/08/21  Yes Satira Sark, MD  Zinc Oxide 10 % OINT Apply 1 application topically daily as needed (buttocks).   Yes [provider]    Allergies    Patient has no known allergies.  Review of Systems   Review of Systems  Constitutional:  Positive for appetite change.  HENT:  Negative for sore throat and trouble swallowing.   Cardiovascular:  Negative for chest pain.  Gastrointestinal:  Positive for abdominal pain and vomiting. Negative for nausea.   Genitourinary:  Negative for difficulty urinating.  Neurological:  Positive for weakness. Negative for speech difficulty.   Physical Exam Updated Vital Signs BP (S) (!) 93/55   Pulse 67   Temp (!) 97.5 F (36.4 C)   Resp 18   Ht '5\' 1"'$  (1.549 m)   Wt 51.7 kg   SpO2 93%   BMI 21.54 kg/m   Physical Exam Vitals and nursing note reviewed.  Constitutional:      Appearance: Normal appearance. She is ill-appearing.     Comments: Patient is in and out of sleep but easily arousable  HENT:     Head: Normocephalic and atraumatic.     Mouth/Throat:     Pharynx: Oropharynx is clear. No oropharyngeal exudate.  Eyes:     General:  Right eye: No discharge.        Left eye: No discharge.  Cardiovascular:     Rate and Rhythm: Normal rate and regular rhythm.     Heart sounds: No murmur heard.   No friction rub. No gallop.  Pulmonary:     Effort: No respiratory distress.     Comments: Patient has appropriate respiratory effort, and is taking shallow breaths, diaphragm appears to be cramped by patients large distended abdomen Abdominal:     Comments: The patient has a large protuberant abdomen, it is hyperresonant to percussion, she is diffusely tender across the entire abdomen, but normal bowel sounds are heard  Skin:    Capillary Refill: Capillary refill takes less than 2 seconds.     Comments: Skin is cold at extremities and dry  Neurological:     Mental Status: She is oriented to person, place, and time.  Psychiatric:        Mood and Affect: Mood normal.        Behavior: Behavior normal.    ED Results / Procedures / Treatments   Labs (all labs ordered are listed, but only abnormal results are displayed) Labs Reviewed  COMPREHENSIVE METABOLIC PANEL - Abnormal; Notable for the following components:      Result Value   Sodium 124 (*)    Chloride 85 (*)    Glucose, Bld 188 (*)    BUN 27 (*)    Creatinine, Ser 1.25 (*)    Calcium 8.6 (*)    Albumin 3.4 (*)    GFR,  Estimated 41 (*)    All other components within normal limits  CBC WITH DIFFERENTIAL/PLATELET - Abnormal; Notable for the following components:   WBC 11.1 (*)    Neutro Abs 9.4 (*)    All other components within normal limits  LACTIC ACID, PLASMA - Abnormal; Notable for the following components:   Lactic Acid, Venous 2.8 (*)    All other components within normal limits  LACTIC ACID, PLASMA - Abnormal; Notable for the following components:   Lactic Acid, Venous 2.8 (*)    All other components within normal limits  LIPASE, BLOOD - Abnormal; Notable for the following components:   Lipase 64 (*)    All other components within normal limits  PROTIME-INR - Abnormal; Notable for the following components:   Prothrombin Time 15.5 (*)    All other components within normal limits  URINE CULTURE  CULTURE, BLOOD (ROUTINE X 2)  CULTURE, BLOOD (ROUTINE X 2)  SARS CORONAVIRUS 2 (TAT 6-24 HRS)  APTT  URINALYSIS, ROUTINE W REFLEX MICROSCOPIC  LACTIC ACID, PLASMA    EKG None  Radiology CT ABDOMEN PELVIS W CONTRAST  Result Date: 06/24/2021 CLINICAL DATA:  Abdominal distension EXAM: CT ABDOMEN AND PELVIS WITH CONTRAST TECHNIQUE: Multidetector CT imaging of the abdomen and pelvis was performed using the standard protocol following bolus administration of intravenous contrast. CONTRAST:  66m OMNIPAQUE IOHEXOL 350 MG/ML SOLN COMPARISON:  CT 01/28/2019 FINDINGS: Lower chest: Bibasilar atelectasis. Heart size is mildly enlarged. Coronary artery calcifications are seen. Hepatobiliary: No focal liver abnormality is seen. No gallstones, gallbladder wall thickening, or biliary dilatation. Pancreas: Mildly atrophic, but appears grossly unremarkable. Spleen: Normal in size without focal abnormality. Adrenals/Urinary Tract: Unremarkable adrenal glands. Kidneys enhance symmetrically without focal lesion, stone, or hydronephrosis. Ureters are nondilated. Urinary bladder appears unremarkable. Stomach/Bowel: Markedly  dilated stomach with fluid and air. No evidence of mechanical outlet obstruction. There is a moderate-sized rectal stool ball. Short segment area  of narrowing at the rectosigmoid junction with a moderately dilated distal sigmoid colon. Moderate volume of stool throughout the remaining colon. What appears to be a normal appendix is seen in the right lower quadrant (series 4, image 51). No dilated loops of small bowel are seen within the abdomen. Vascular/Lymphatic: Atherosclerotic calcifications are present throughout the aortoiliac axis. No abdominopelvic lymphadenopathy. Reproductive: Uterus and bilateral adnexa are unremarkable. Other: No free fluid. No abdominopelvic fluid collection. No pneumoperitoneum. No abdominal wall hernia. Musculoskeletal: Diffuse osseous demineralization. Vertebra plana deformity of the T10 vertebral body, progressed from 2020. Mild chronic L2 compression deformity. IMPRESSION: 1. Markedly dilated stomach with fluid and air. No evidence of mechanical outlet obstruction. 2. Short segment area of narrowing at the rectosigmoid junction with a moderately dilated distal sigmoid colon. Findings may represent a focal area of peristalsis, although a colonic mass is not entirely excluded. A follow-up colonoscopy could be considered. 3. Moderate-sized rectal stool ball. 4. Vertebra plana deformity of the T10 vertebral body, progressed from 2020. Mild chronic L2 compression deformity. 5. Aortic atherosclerosis (ICD10-I70.0). Electronically Signed   By: Davina Poke D.O.   On: 06/24/2021 20:07   DG Abdomen Acute W/Chest  Result Date: 06/24/2021 CLINICAL DATA:  Abdominal pain and distension. EXAM: DG ABDOMEN ACUTE WITH 1 VIEW CHEST COMPARISON:  CT 01/28/2019 FINDINGS: Anti lordotic positioning. Low lung volumes. Borderline cardiomegaly with coronary stent. No acute airspace disease or pleural effusion. No pneumothorax. No free intra-abdominal air. Marked gaseous gastric distension, similar  gastric distension seen on prior exam when the stomach is fluid-filled. Large volume of colonic stool. There is also likely gaseous distension of transverse colon, although air-filled structure coursing across the mid abdomen may be distended stomach. No obvious small bowel dilatation. Prominent vascular calcifications are seen. The bones are diffusely under mineralized. No obvious acute osseous abnormality. Surgical clips in the right breast. IMPRESSION: 1. Marked gaseous gastric distension. The stomach was previously prominently distended with fluid. Air-filled structure coursing transversely across the mid abdomen may be gaseous distension of transverse colon or gastric distension. 2. Large volume of colonic stool. 3. Low lung volumes without acute abnormality in the chest. Electronically Signed   By: Keith Rake M.D.   On: 06/24/2021 17:59    Procedures Procedures   Medications Ordered in ED Medications  metroNIDAZOLE (FLAGYL) IVPB 500 mg (0 mg Intravenous Stopped 06/24/21 2118)  ceFEPIme (MAXIPIME) 2 g in sodium chloride 0.9 % 100 mL IVPB (has no administration in time range)  vancomycin (VANCOREADY) IVPB 500 mg/100 mL (has no administration in time range)  pantoprazole (PROTONIX) injection 40 mg (has no administration in time range)  0.9 %  sodium chloride infusion (has no administration in time range)  pantoprazole (PROTONIX) injection 40 mg (has no administration in time range)  lidocaine (LIDODERM) 5 % 1 patch (has no administration in time range)  benzonatate (TESSALON) capsule 200 mg (has no administration in time range)  morphine 2 MG/ML injection 2 mg (has no administration in time range)  sodium chloride 0.9 % bolus 500 mL (has no administration in time range)  fentaNYL (SUBLIMAZE) injection 50 mcg (50 mcg Intravenous Given 06/24/21 1729)  sodium chloride 0.9 % bolus 1,000 mL (1,000 mLs Intravenous New Bag/Given 06/24/21 1907)  ceFEPIme (MAXIPIME) 2 g in sodium chloride 0.9 % 100  mL IVPB (0 g Intravenous Stopped 06/24/21 2118)  vancomycin (VANCOCIN) IVPB 1000 mg/200 mL premix (0 mg Intravenous Stopped 06/24/21 2118)  iohexol (OMNIPAQUE) 350 MG/ML injection 100 mL (75 mLs Intravenous  Contrast Given 06/24/21 1941)    ED Course  I have reviewed the triage vital signs and the nursing notes.  Pertinent labs & imaging results that were available during my care of the patient were reviewed by me and considered in my medical decision making (see chart for details).  Clinical Course as of 06/24/21 2144  Fri Jun 24, 2021  1930 Lactic Acid, Venous(!!): 2.8 Elevated lactic acid, elevated white count, hypotension are all concerning for evolving sepsis, antibiotics, blood cultures and further [CP]    Clinical Course User Index [CP] Alla Sloma, Joesph Fillers, PA-C   MDM Rules/Calculators/A&P                         Patient clinically ill appearing with notable abdominal distension. Abdominal radiographs reveal air fluid levels and large stool burden without free air. Initial lab work remarkable for lactic acidosis, elevated white blood cell count, and hyponatremia. Blood pressures very soft, at 70s/50s on arrival. Fluid bolus given, and broad spectrum antibiotics started. CT abdomen and pelvis confirms large colonic stool burden, distended stomach, and a "short segmented area of narrowing at the rectosigmoid junction with a moderately dilated distal sigmoid colon. Findings may represent a focal area of peristalsis, although a colonic mass is not entirely exclude." No GI outlet obstructions noted. CT also revealed evidence of compression of L2 vertebra from previous injury. Altogether the patient's clinical picture today seems to represent an acute on chronic presentation of worsening gastric emptying not unlike a previous admission for similar in 2020.  Based on the combined findings above, a consult to GI was placed to discuss placement of NG tube for decompression. GI consult agreed this  may help to relieve some of the discomfort from abdominal distention and NG tube placed. Protonix also initiated given extensive history of EGD confirmed ulcers. Consult placed for admission to the hospitalist for distended abdomen with delayed gastric emptying, elevated lactic acid, hyponatremia and hypotension.  Vitals:   06/24/21 1915 06/24/21 2030 06/24/21 2057 06/24/21 2058  BP: (!) 70/52 (!) 92/56 97/62 (S) (!) 93/55  Pulse: 73 60 66 67  Resp: (!) 24 (!) '25 14 18  '$ Temp:      TempSrc:      SpO2: 96% 94% 93% 93%  Weight:      Height:        Final Clinical Impression(s) / ED Diagnoses Final diagnoses:  Generalized abdominal pain  Hyponatremia  Lactic acidosis  Abdominal distension    Rx / DC Orders ED Discharge Orders     None        Dorien Chihuahua 06/24/21 2239    Noemi Chapel, MD 06/25/21 1201

## 2021-06-25 ENCOUNTER — Inpatient Hospital Stay (HOSPITAL_COMMUNITY): Payer: PPO

## 2021-06-25 DIAGNOSIS — K3189 Other diseases of stomach and duodenum: Secondary | ICD-10-CM

## 2021-06-25 DIAGNOSIS — E871 Hypo-osmolality and hyponatremia: Secondary | ICD-10-CM

## 2021-06-25 DIAGNOSIS — R935 Abnormal findings on diagnostic imaging of other abdominal regions, including retroperitoneum: Secondary | ICD-10-CM | POA: Insufficient documentation

## 2021-06-25 DIAGNOSIS — E441 Mild protein-calorie malnutrition: Secondary | ICD-10-CM

## 2021-06-25 DIAGNOSIS — R1084 Generalized abdominal pain: Secondary | ICD-10-CM | POA: Diagnosis not present

## 2021-06-25 DIAGNOSIS — I959 Hypotension, unspecified: Secondary | ICD-10-CM

## 2021-06-25 LAB — GLUCOSE, CAPILLARY
Glucose-Capillary: 130 mg/dL — ABNORMAL HIGH (ref 70–99)
Glucose-Capillary: 140 mg/dL — ABNORMAL HIGH (ref 70–99)

## 2021-06-25 LAB — CBC WITH DIFFERENTIAL/PLATELET
Abs Immature Granulocytes: 0.06 10*3/uL (ref 0.00–0.07)
Basophils Absolute: 0 10*3/uL (ref 0.0–0.1)
Basophils Relative: 0 %
Eosinophils Absolute: 0 10*3/uL (ref 0.0–0.5)
Eosinophils Relative: 0 %
HCT: 33.8 % — ABNORMAL LOW (ref 36.0–46.0)
Hemoglobin: 12.2 g/dL (ref 12.0–15.0)
Immature Granulocytes: 1 %
Lymphocytes Relative: 7 %
Lymphs Abs: 0.8 10*3/uL (ref 0.7–4.0)
MCH: 34.2 pg — ABNORMAL HIGH (ref 26.0–34.0)
MCHC: 36.1 g/dL — ABNORMAL HIGH (ref 30.0–36.0)
MCV: 94.7 fL (ref 80.0–100.0)
Monocytes Absolute: 0.7 10*3/uL (ref 0.1–1.0)
Monocytes Relative: 7 %
Neutro Abs: 8.8 10*3/uL — ABNORMAL HIGH (ref 1.7–7.7)
Neutrophils Relative %: 85 %
Platelets: 170 10*3/uL (ref 150–400)
RBC: 3.57 MIL/uL — ABNORMAL LOW (ref 3.87–5.11)
RDW: 13.2 % (ref 11.5–15.5)
WBC: 10.4 10*3/uL (ref 4.0–10.5)
nRBC: 0 % (ref 0.0–0.2)

## 2021-06-25 LAB — COMPREHENSIVE METABOLIC PANEL
ALT: 16 U/L (ref 0–44)
AST: 23 U/L (ref 15–41)
Albumin: 2.9 g/dL — ABNORMAL LOW (ref 3.5–5.0)
Alkaline Phosphatase: 89 U/L (ref 38–126)
Anion gap: 8 (ref 5–15)
BUN: 32 mg/dL — ABNORMAL HIGH (ref 8–23)
CO2: 23 mmol/L (ref 22–32)
Calcium: 8 mg/dL — ABNORMAL LOW (ref 8.9–10.3)
Chloride: 97 mmol/L — ABNORMAL LOW (ref 98–111)
Creatinine, Ser: 0.85 mg/dL (ref 0.44–1.00)
GFR, Estimated: >60 mL/min (ref >60)
Glucose, Bld: 100 mg/dL — ABNORMAL HIGH (ref 70–99)
Potassium: 3 mmol/L — ABNORMAL LOW (ref 3.5–5.1)
Sodium: 128 mmol/L — ABNORMAL LOW (ref 135–145)
Total Bilirubin: 1.1 mg/dL (ref 0.3–1.2)
Total Protein: 5.6 g/dL — ABNORMAL LOW (ref 6.5–8.1)

## 2021-06-25 LAB — MAGNESIUM: Magnesium: 1.7 mg/dL (ref 1.7–2.4)

## 2021-06-25 LAB — PROTIME-INR
INR: 1.2 (ref 0.8–1.2)
Prothrombin Time: 15.1 seconds (ref 11.4–15.2)

## 2021-06-25 LAB — LACTIC ACID, PLASMA: Lactic Acid, Venous: 1.5 mmol/L (ref 0.5–1.9)

## 2021-06-25 LAB — SARS CORONAVIRUS 2 (TAT 6-24 HRS): SARS Coronavirus 2: NEGATIVE

## 2021-06-25 LAB — PROCALCITONIN: Procalcitonin: 0.23 ng/mL

## 2021-06-25 LAB — CORTISOL-AM, BLOOD: Cortisol - AM: 26.3 ug/dL — ABNORMAL HIGH (ref 6.7–22.6)

## 2021-06-25 MED ORDER — POTASSIUM CHLORIDE 10 MEQ/100ML IV SOLN
10.0000 meq | INTRAVENOUS | Status: AC
  Administered 2021-06-25 (×4): 10 meq via INTRAVENOUS
  Filled 2021-06-25 (×4): qty 100

## 2021-06-25 MED ORDER — KCL IN DEXTROSE-NACL 20-5-0.45 MEQ/L-%-% IV SOLN
INTRAVENOUS | Status: DC
  Filled 2021-06-25 (×7): qty 1000

## 2021-06-25 MED ORDER — BISACODYL 10 MG RE SUPP
10.0000 mg | Freq: Once | RECTAL | Status: AC
  Administered 2021-06-25: 10 mg via RECTAL
  Filled 2021-06-25: qty 1

## 2021-06-25 MED ORDER — PHENOL 1.4 % MT LIQD
1.0000 | OROMUCOSAL | Status: DC | PRN
Start: 2021-06-25 — End: 2021-07-06

## 2021-06-25 MED ORDER — FLEET ENEMA 7-19 GM/118ML RE ENEM: 1.0000 | ENEMA | Freq: Once | RECTAL | Status: DC

## 2021-06-25 MED ORDER — MAGNESIUM SULFATE 2 GM/50ML IV SOLN
2.0000 g | Freq: Once | INTRAVENOUS | Status: AC
  Administered 2021-06-25: 2 g via INTRAVENOUS
  Filled 2021-06-25: qty 50

## 2021-06-25 MED ORDER — FLEET ENEMA 7-19 GM/118ML RE ENEM
1.0000 | ENEMA | Freq: Once | RECTAL | Status: AC
  Administered 2021-06-25: 1 via RECTAL

## 2021-06-25 NOTE — ED Notes (Signed)
Administered enema and placed the patient on bedpan.  Patient was unable to have a bowel movement.

## 2021-06-25 NOTE — Consult Note (Signed)
Consulting  Provider: Dr. Clearence Ped Primary Care Physician:  [name], MD Primary Gastroenterologist:  Dr. Laural Golden  Reason for Consultation:  Abnormal CT imaging  HPI:  Debra Doyle is a 87 y.o. female with a past medical history of esophagitis, peptic ulcer disease, coronary artery disease, hypertension, ischemic cardiomyopathy, who presented to [facility] ER yesterday evening with abdominal pain and distention.  Also notes back pain for the last 2 to 3 weeks.  States her abdomen has become more bloated over the last few months.  This has caused abdominal pain which she describes as constant, does not radiate, moderate in severity.  Also has noted associated nausea and vomiting.  No hematemesis or coffee-ground emesis.  States her last bowel movement was 4 days ago.  At baseline she has a bowel movement every 2 to 3 days.  Work-up in the ER revealed hypotension, elevated lactic acid.  Subsequent CT abdomen pelvis showed dilated stomach with fluid and air without evidence of gastric outlet obstruction, also noted peristalsis of the rectosigmoid junction with potential narrowing/mass as well as a moderate sized rectal stool ball.  NG tube placed in ER and patient states this is helped with her distention and nausea.  She has been placed on antibiotics as well as IV Protonix twice daily.  We have been consulted regarding her abnormal CT findings.  Past Medical History:  Diagnosis Date   Coronary atherosclerosis of native coronary artery    a. s/p DES x4 to LAD in 2012, ISR in 11/2013 with thrombectomy and angioplasty alone of LAD   Essential hypertension    Fibrocystic breast disease    Hx of colonic polyps    Ischemic cardiomyopathy    Myocardial infarction [facility])    NSTEMI 1/12   ST elevation (STEMI) myocardial infarction involving left anterior descending coronary artery [facility])    January 2015 - LAD stent thrombosis off DAPT    Past Surgical History:  Procedure Laterality Date    CATARACT EXTRACTION, BILATERAL     Bilateral   ESOPHAGOGASTRODUODENOSCOPY N/A 01/29/2019   Procedure: ESOPHAGOGASTRODUODENOSCOPY (EGD);  Surgeon: Rogene Houston, MD;  Location: [facility] ENDO SUITE;  Service: Endoscopy;  Laterality: N/A;   LEFT HEART CATHETERIZATION WITH CORONARY ANGIOGRAM N/A 11/25/2013   Procedure: LEFT HEART CATHETERIZATION WITH CORONARY ANGIOGRAM;  Surgeon: Sinclair Grooms, MD;  Location: [facility] CATH LAB;  Service: Cardiovascular;  Laterality: N/A;   PERCUTANEOUS CORONARY STENT INTERVENTION (PCI-S)  11/25/2013   Procedure: PERCUTANEOUS CORONARY STENT INTERVENTION (PCI-S);  Surgeon: Sinclair Grooms, MD;  Location: [facility] CATH LAB;  Service: Cardiovascular;;    Prior to Admission medications   Medication Sig Start Date End Date Taking? Authorizing Provider  acetaminophen (TYLENOL) 325 MG tablet Take 2 tablets (650 mg total) by mouth every 6 (six) hours as needed for fever or headache (pain). 01/31/19  Yes Barton Dubois, MD  Ascorbic Acid (VITAMIN C) 500 MG tablet Take 500 mg by mouth daily.   Yes [provider]  aspirin EC 81 MG tablet Take 1 tablet (81 mg total) by mouth daily. 02/04/19  Yes Barton Dubois, MD  atorvastatin (LIPITOR) 40 MG tablet TAKE 1 TABLET BY MOUTH EVERY DAY AT 6:00 PM Patient taking differently: Take 40 mg by mouth daily. 02/08/21  Yes Satira Sark, MD  carvedilol (COREG) 6.25 MG tablet TAKE 1 TABLET BY MOUTH TWICE DAILY 02/08/21  Yes Satira Sark, MD  cyanocobalamin 1000 MCG tablet Take 1,000 mcg by mouth daily.   Yes [provider]  furosemide (LASIX) 40 MG tablet Take 40 mg by mouth daily.  02/10/19  Yes [provider]  ketoconazole (NIZORAL) 2 % cream Apply 1 application topically 2 (two) times daily. 06/09/21  Yes [provider]  losartan (COZAAR) 50 MG tablet TAKE 1 TABLET BY MOUTH EVERY DAY 11/16/20  Yes Satira Sark, MD  LUTEIN PO Take 1 capsule by mouth daily.   Yes [provider]  nitroGLYCERIN  (NITROSTAT) 0.4 MG SL tablet Place 0.4 mg under the tongue as needed for chest pain. Place one tablet under the tongue every 5 minutes x 3 doses max as needed for chest pain.  Contact MD or 911 with unrelieved pain   Yes [provider]  pantoprazole (PROTONIX) 40 MG tablet Take 40 mg by mouth daily.   Yes [provider]  senna (SENOKOT) 8.6 MG TABS tablet Take 1 tablet by mouth daily as needed for mild constipation.   Yes [provider]  Specialty Vitamins Products (BILBERRY PLUS PO) Take 1 capsule by mouth daily.   Yes [provider]  spironolactone (ALDACTONE) 25 MG tablet TAKE 1/2 TABLET BY MOUTH EVERY DAY 02/08/21  Yes Satira Sark, MD  Zinc Oxide 10 % OINT Apply 1 application topically daily as needed (buttocks).   Yes [provider]    Current Facility-Administered Medications  Medication Dose Route Frequency Provider Last Rate Last Admin   acetaminophen (TYLENOL) tablet 650 mg  650 mg Oral Q6H PRN Zierle-Ghosh, Asia B, DO       Or   acetaminophen (TYLENOL) suppository 650 mg  650 mg Rectal Q6H PRN Zierle-Ghosh, Asia B, DO       benzonatate (TESSALON) capsule 200 mg  200 mg Oral TID PRN Zierle-Ghosh, Asia B, DO       ceFEPIme (MAXIPIME) 2 g in sodium chloride 0.9 % 100 mL IVPB  2 g Intravenous Q24H Zierle-Ghosh, Asia B, DO       dextrose 5 % and 0.45 % NaCl with KCl 20 mEq/L infusion   Intravenous Continuous Emokpae, Courage, MD       heparin injection 5,000 Units  5,000 Units Subcutaneous Q8H Zierle-Ghosh, Asia B, DO   5,000 Units at 06/25/21 0918   lidocaine (LIDODERM) 5 % 1 patch  1 patch Transdermal Q24H Zierle-Ghosh, Asia B, DO       metroNIDAZOLE (FLAGYL) IVPB 500 mg  500 mg Intravenous Q8H Zierle-Ghosh, Asia B, DO 100 mL/hr at 06/25/21 1023 500 mg at 06/25/21 1023   morphine 2 MG/ML injection 2 mg  2 mg Intravenous Q3H PRN Zierle-Ghosh, Asia B, DO       ondansetron (ZOFRAN) tablet 4 mg  4 mg Oral Q6H PRN Zierle-Ghosh, Asia B, DO        Or   ondansetron (ZOFRAN) injection 4 mg  4 mg Intravenous Q6H PRN Zierle-Ghosh, Asia B, DO       pantoprazole (PROTONIX) injection 40 mg  40 mg Intravenous Q12H Zierle-Ghosh, Asia B, DO   40 mg at 06/25/21 0917   potassium chloride 10 mEq in 100 mL IVPB  10 mEq Intravenous Q1 Hr x 4 Emokpae, Courage, MD 100 mL/hr at 06/25/21 1136 10 mEq at 06/25/21 1136   vancomycin (VANCOREADY) IVPB 500 mg/100 mL  500 mg Intravenous Q24H Zierle-Ghosh, Asia B, DO       Current Outpatient Medications  Medication Sig Dispense Refill   acetaminophen (TYLENOL) 325 MG tablet Take 2 tablets (650 mg total) by mouth every  6 (six) hours as needed for fever or headache (pain).     Ascorbic Acid (VITAMIN C) 500 MG tablet Take 500 mg by mouth daily.     aspirin EC 81 MG tablet Take 1 tablet (81 mg total) by mouth daily.     atorvastatin (LIPITOR) 40 MG tablet TAKE 1 TABLET BY MOUTH EVERY DAY AT 6:00 PM (Patient taking differently: Take 40 mg by mouth daily.) 90 tablet 1   carvedilol (COREG) 6.25 MG tablet TAKE 1 TABLET BY MOUTH TWICE DAILY 180 tablet 1   cyanocobalamin 1000 MCG tablet Take 1,000 mcg by mouth daily.     furosemide (LASIX) 40 MG tablet Take 40 mg by mouth daily.      ketoconazole (NIZORAL) 2 % cream Apply 1 application topically 2 (two) times daily.     losartan (COZAAR) 50 MG tablet TAKE 1 TABLET BY MOUTH EVERY DAY 90 tablet 3   LUTEIN PO Take 1 capsule by mouth daily.     nitroGLYCERIN (NITROSTAT) 0.4 MG SL tablet Place 0.4 mg under the tongue as needed for chest pain. Place one tablet under the tongue every 5 minutes x 3 doses max as needed for chest pain.  Contact MD or 911 with unrelieved pain     pantoprazole (PROTONIX) 40 MG tablet Take 40 mg by mouth daily.     senna (SENOKOT) 8.6 MG TABS tablet Take 1 tablet by mouth daily as needed for mild constipation.     Specialty Vitamins Products (BILBERRY PLUS PO) Take 1 capsule by mouth daily.     spironolactone (ALDACTONE) 25 MG tablet TAKE 1/2  TABLET BY MOUTH EVERY DAY 45 tablet 1   Zinc Oxide 10 % OINT Apply 1 application topically daily as needed (buttocks).      Allergies as of 06/24/2021   (No Known Allergies)    Family History  Problem Relation Age of Onset   Coronary artery disease Mother    Emphysema Father    Cancer Sister        1 sister/cancer type unknown   Stroke Brother        1 brother    Social History   Socioeconomic History   Marital status: Widowed    Spouse name: Not on file   Number of children: Not on file   Years of education: Not on file   Highest education level: Not on file  Occupational History   Occupation: Retired    Fish farm manager: RETIRED  Tobacco Use   Smoking status: Never   Smokeless tobacco: Never  Vaping Use   Vaping Use: Never used  Substance and Sexual Activity   Alcohol use: No   Drug use: No   Sexual activity: Not on file  Other Topics Concern   Not on file  Social History Narrative   Not on file   Social Determinants of Health   Financial Resource Strain: Not on file  Food Insecurity: Not on file  Transportation Needs: Not on file  Physical Activity: Not on file  Stress: Not on file  Social Connections: Not on file  Intimate Partner Violence: Not on file    Review of Systems: General: Negative for anorexia, weight loss, fever, chills, fatigue, weakness. Eyes: Negative for vision changes.  ENT: Negative for hoarseness, difficulty swallowing , nasal congestion. CV: Negative for chest pain, angina, palpitations, dyspnea on exertion, peripheral edema.  Respiratory: Negative for dyspnea at rest, dyspnea on exertion, cough, sputum, wheezing.  GI: See history of present illness. GU:  Negative for dysuria, hematuria, urinary incontinence, urinary frequency, nocturnal urination.  MS: Negative for joint pain, low back pain.  Derm: Negative for rash or itching.  Neuro: Negative for weakness, abnormal sensation, seizure, frequent headaches, memory loss, confusion.   Psych: Negative for anxiety, depression Endo: Negative for unusual weight change.  Heme: Negative for bruising or bleeding. Allergy: Negative for rash or hives.  Physical Exam: Vital signs in last 24 hours: Temp:  [97.5 F (36.4 C)-97.7 F (36.5 C)] 97.5 F (36.4 C) (08/12 1648) Pulse Rate:  [60-112] 80 (08/13 1130) Resp:  [14-48] 18 (08/13 1130) BP: (69-125)/(52-85) 109/80 (08/13 1130) SpO2:  [93 %-99 %] 95 % (08/13 1130) Weight:  [51.7 kg] 51.7 kg (08/12 1613)   General:   Alert,  Well-developed, well-nourished, pleasant and cooperative in NAD Head:  Normocephalic and atraumatic. Eyes:  Sclera clear, no icterus.   Conjunctiva pink. Ears:  Normal auditory acuity. Nose:  No deformity, discharge,  or lesions. Mouth:  No deformity or lesions, dentition normal. Neck:  Supple; no masses or thyromegaly. Lungs:  Clear throughout to auscultation.   No wheezes, crackles, or rhonchi. No acute distress. Heart:  Regular rate and rhythm; no murmurs, clicks, rubs,  or gallops. Abdomen:  Soft, nontender and nondistended. No masses, hepatosplenomegaly or hernias noted. Normal bowel sounds, without guarding, and without rebound.   Msk:  Symmetrical without gross deformities. Normal posture. Pulses:  Normal pulses noted. Extremities:  Without clubbing or edema. Neurologic:  Alert and  oriented x4;  grossly normal neurologically. Skin:  Intact without significant lesions or rashes. Cervical Nodes:  No significant cervical adenopathy. Psych:  Alert and cooperative. Normal mood and affect.  Intake/Output from previous day: 08/12 0701 - 08/13 0700 In: 1897.4 [IV Piggyback:1897.4] Out: 700  Intake/Output this shift: No intake/output data recorded.  Lab Results: Recent Labs    06/24/21 1711 06/25/21 0455  WBC 11.1* 10.4  HGB 14.1 12.2  HCT 40.3 33.8*  PLT 198 170   BMET Recent Labs    06/24/21 1711 06/25/21 0455  NA 124* 128*  K 4.1 3.0*  CL 85* 97*  CO2 27 23  GLUCOSE 188*  100*  BUN 27* 32*  CREATININE 1.25* 0.85  CALCIUM 8.6* 8.0*   LFT Recent Labs    06/24/21 1711 06/25/21 0455  PROT 6.5 5.6*  ALBUMIN 3.4* 2.9*  AST 28 23  ALT 19 16  ALKPHOS 110 89  BILITOT 1.0 1.1   PT/INR Recent Labs    06/24/21 1832 06/25/21 0455  LABPROT 15.5* 15.1  INR 1.2 1.2   Hepatitis Panel No results for input(s): HEPBSAG, HCVAB, HEPAIGM, HEPBIGM in the last 72 hours. C-Diff No results for input(s): CDIFFTOX in the last 72 hours.  Studies/Results: CT ABDOMEN PELVIS W CONTRAST  Result Date: 06/24/2021 CLINICAL DATA:  Abdominal distension EXAM: CT ABDOMEN AND PELVIS WITH CONTRAST TECHNIQUE: Multidetector CT imaging of the abdomen and pelvis was performed using the standard protocol following bolus administration of intravenous contrast. CONTRAST:  30m OMNIPAQUE IOHEXOL 350 MG/ML SOLN COMPARISON:  CT 01/28/2019 FINDINGS: Lower chest: Bibasilar atelectasis. Heart size is mildly enlarged. Coronary artery calcifications are seen. Hepatobiliary: No focal liver abnormality is seen. No gallstones, gallbladder wall thickening, or biliary dilatation. Pancreas: Mildly atrophic, but appears grossly unremarkable. Spleen: Normal in size without focal abnormality. Adrenals/Urinary Tract: Unremarkable adrenal glands. Kidneys enhance symmetrically without focal lesion, stone, or hydronephrosis. Ureters are nondilated. Urinary bladder appears unremarkable. Stomach/Bowel: Markedly dilated stomach with fluid and air. No evidence of mechanical  outlet obstruction. There is a moderate-sized rectal stool ball. Short segment area of narrowing at the rectosigmoid junction with a moderately dilated distal sigmoid colon. Moderate volume of stool throughout the remaining colon. What appears to be a normal appendix is seen in the right lower quadrant (series 4, image 51). No dilated loops of small bowel are seen within the abdomen. Vascular/Lymphatic: Atherosclerotic calcifications are present  throughout the aortoiliac axis. No abdominopelvic lymphadenopathy. Reproductive: Uterus and bilateral adnexa are unremarkable. Other: No free fluid. No abdominopelvic fluid collection. No pneumoperitoneum. No abdominal wall hernia. Musculoskeletal: Diffuse osseous demineralization. Vertebra plana deformity of the T10 vertebral body, progressed from 2020. Mild chronic L2 compression deformity. IMPRESSION: 1. Markedly dilated stomach with fluid and air. No evidence of mechanical outlet obstruction. 2. Short segment area of narrowing at the rectosigmoid junction with a moderately dilated distal sigmoid colon. Findings may represent a focal area of peristalsis, although a colonic mass is not entirely excluded. A follow-up colonoscopy could be considered. 3. Moderate-sized rectal stool ball. 4. Vertebra plana deformity of the T10 vertebral body, progressed from 2020. Mild chronic L2 compression deformity. 5. Aortic atherosclerosis (ICD10-I70.0). Electronically Signed   By: Davina Poke D.O.   On: 06/24/2021 20:07   DG CHEST PORT 1 VIEW  Result Date: 06/25/2021 CLINICAL DATA:  Cough, NG tube placement EXAM: PORTABLE CHEST 1 VIEW COMPARISON:  01/29/2019 FINDINGS: Lungs are essentially clear.  No pleural effusion or pneumothorax. The heart is normal in size.  Coronary stent. Enteric tube courses into the mid stomach. IMPRESSION: Enteric tube courses into the mid stomach. Electronically Signed   By: Julian Hy M.D.   On: 06/25/2021 02:44   DG Abdomen Acute W/Chest  Result Date: 06/24/2021 CLINICAL DATA:  Abdominal pain and distension. EXAM: DG ABDOMEN ACUTE WITH 1 VIEW CHEST COMPARISON:  CT 01/28/2019 FINDINGS: Anti lordotic positioning. Low lung volumes. Borderline cardiomegaly with coronary stent. No acute airspace disease or pleural effusion. No pneumothorax. No free intra-abdominal air. Marked gaseous gastric distension, similar gastric distension seen on prior exam when the stomach is fluid-filled.  Large volume of colonic stool. There is also likely gaseous distension of transverse colon, although air-filled structure coursing across the mid abdomen may be distended stomach. No obvious small bowel dilatation. Prominent vascular calcifications are seen. The bones are diffusely under mineralized. No obvious acute osseous abnormality. Surgical clips in the right breast. IMPRESSION: 1. Marked gaseous gastric distension. The stomach was previously prominently distended with fluid. Air-filled structure coursing transversely across the mid abdomen may be gaseous distension of transverse colon or gastric distension. 2. Large volume of colonic stool. 3. Low lung volumes without acute abnormality in the chest. Electronically Signed   By: Keith Rake M.D.   On: 06/24/2021 17:59    Impression: *Abdominal distention and pain *Dilated stomach without outlet obstruction *Rectal stool ball *Sigmoid colon narrowing-peristalsis versus colonic mass  Plan: Patient notes slight improvement with NG tube placement.  Continue for now.  Still having a good amount of output.  In regards to her abnormal colon findings, she would likely benefit from a flexible sigmoidoscopy to further evaluate.  Possible she is obstructed from rectal stool ball versus sigmoid stricture/malignancy.  We will give fleet  enemas today to see if we can relieve her stool ball.  We will ask nursing to perform disimpaction.  If unsuccessful, we may need to do this under anesthesia with flexible sigmoidoscopy after in the AM.   Further recommendations to follow.  Elon Alas. Abbey Chatters, D.O.  Gastroenterology and Hepatology [facility]    LOS: 1 day     06/25/2021, 11:58 AM

## 2021-06-25 NOTE — ED Notes (Signed)
After placing suppository, patient has had one small bowel movement. Cleaned the patient and placed new brief and linen on bed.

## 2021-06-25 NOTE — H&P (Signed)
TRH H&P    Patient Demographics:    Debra Doyle, is a 87 y.o. female  MRN: [id number]  DOB - [birth year]  Admit Date - 06/24/2021  Referring MD/NP/PA: Sabra Heck  Outpatient Primary MD for the patient is [name], MD  Patient coming from: Home  Chief complaint- back pain   HPI:    Debra Doyle  is a 87 y.o. female, with history of coronary artery disease, essential hypertension, fibrocystic breast disease, ischemic cardiomyopathy, myocardial infarction, and more presents the ED with a chief complaint of back pain.  Patient reports that her back has been hurting over the last few weeks.  It is worse with standing and with positional changes.  The intensity is severe.  She characterizes the pain as aching.  There is no radiation.  Tylenol has helped a little.  Right now her pain is 0 out of 10, as she is not moving.  But then she coughs, and then starts crying because the pain is so bad.  She also has a bloated abdomen.  This has been going on for 2 months.  It has been constant.  She has a normal appetite per her report, son at bedside reports that she eats very small meals.  Patient reports that she has been nauseous and vomiting/regurgitating.  She has had no hematemesis.  Her last bowel movement was 3 days ago.  Normal for her is to go every other day.  She reports drinking plenty of water each day.  Patient denies abdominal pain.  She has no other complaints at this time.  In the ED Temp 97.5, heart rate 60-112, respiratory 14-48, blood pressure 93/55, satting at 93% Lactic acid 2.8 and 2.8 and then decreases to normal Blood culture pending CT abdomen pelvis shows markedly dilated stomach with fluid and air, no evidence of mechanical outlet obstruction, focal peristalsis of the rectosigmoid junction, rectal stool Leukocytosis 11.1, hemoglobin 14.1 Hyponatremia at 124, hypochloremia 85, decreased bicarb at 17, BUN  27, creatinine 1.25, albumin 3.4 BPs have been soft at 83/67, as low as 69/53 NG tube placed Urine pending, fentanyl, Flagyl, Protonix, vancomycin, cefepime, 1 L normal saline given in the ED    Review of systems:    In addition to the HPI above,  No Fever-chills, No Headache, No changes with Vision or hearing, Patient does complain of regurgitation No Chest pain, Cough or Shortness of Breath, No Abdominal pain, patient complains of nausea and vomiting, patient complains of constipation No Blood in stool or Urine, No dysuria, No new skin rashes or bruises, Patient complains of back pain No new weakness, tingling, numbness in any extremity, No recent weight gain or loss, No polyuria, polydypsia or polyphagia, No significant Mental Stressors.  All other systems reviewed and are negative.    Past History of the following :    Past Medical History:  Diagnosis Date   Coronary atherosclerosis of native coronary artery    a. s/p DES x4 to LAD in 2012, ISR in 11/2013 with thrombectomy and angioplasty alone of LAD  Essential hypertension    Fibrocystic breast disease    Hx of colonic polyps    Ischemic cardiomyopathy    Myocardial infarction [facility])    NSTEMI 1/12   ST elevation (STEMI) myocardial infarction involving left anterior descending coronary artery [facility])    January 2015 - LAD stent thrombosis off DAPT      Past Surgical History:  Procedure Laterality Date   CATARACT EXTRACTION, BILATERAL     Bilateral   ESOPHAGOGASTRODUODENOSCOPY N/A 01/29/2019   Procedure: ESOPHAGOGASTRODUODENOSCOPY (EGD);  Surgeon: Rogene Houston, MD;  Location: [facility];  Service: Endoscopy;  Laterality: N/A;   LEFT HEART CATHETERIZATION WITH CORONARY ANGIOGRAM N/A 11/25/2013   Procedure: LEFT HEART CATHETERIZATION WITH CORONARY ANGIOGRAM;  Surgeon: Sinclair Grooms, MD;  Location: [facility] CATH LAB;  Service: Cardiovascular;  Laterality: N/A;   PERCUTANEOUS CORONARY STENT INTERVENTION (PCI-S)   11/25/2013   Procedure: PERCUTANEOUS CORONARY STENT INTERVENTION (PCI-S);  Surgeon: Sinclair Grooms, MD;  Location: [facility] CATH LAB;  Service: Cardiovascular;;      Social History:      Social History   Tobacco Use   Smoking status: Never   Smokeless tobacco: Never  Substance Use Topics   Alcohol use: No       Family History :     Family History  Problem Relation Age of Onset   Coronary artery disease Mother    Emphysema Father    Cancer Sister        1 sister/cancer type unknown   Stroke Brother        1 brother      Home Medications:   Prior to Admission medications   Medication Sig Start Date End Date Taking? Authorizing Provider  acetaminophen (TYLENOL) 325 MG tablet Take 2 tablets (650 mg total) by mouth every 6 (six) hours as needed for fever or headache (pain). 01/31/19  Yes Barton Dubois, MD  Ascorbic Acid (VITAMIN C) 500 MG tablet Take 500 mg by mouth daily.   Yes [provider]  aspirin EC 81 MG tablet Take 1 tablet (81 mg total) by mouth daily. 02/04/19  Yes Barton Dubois, MD  atorvastatin (LIPITOR) 40 MG tablet TAKE 1 TABLET BY MOUTH EVERY DAY AT 6:00 PM Patient taking differently: Take 40 mg by mouth daily. 02/08/21  Yes Satira Sark, MD  carvedilol (COREG) 6.25 MG tablet TAKE 1 TABLET BY MOUTH TWICE DAILY 02/08/21  Yes Satira Sark, MD  cyanocobalamin 1000 MCG tablet Take 1,000 mcg by mouth daily.   Yes [provider]  furosemide (LASIX) 40 MG tablet Take 40 mg by mouth daily.  02/10/19  Yes [provider]  ketoconazole (NIZORAL) 2 % cream Apply 1 application topically 2 (two) times daily. 06/09/21  Yes [provider]  losartan (COZAAR) 50 MG tablet TAKE 1 TABLET BY MOUTH EVERY DAY 11/16/20  Yes Satira Sark, MD  LUTEIN PO Take 1 capsule by mouth daily.   Yes [provider]  nitroGLYCERIN (NITROSTAT) 0.4 MG SL tablet Place 0.4 mg under the tongue as needed for chest pain. Place one tablet under  the tongue every 5 minutes x 3 doses max as needed for chest pain.  Contact MD or 911 with unrelieved pain   Yes [provider]  pantoprazole (PROTONIX) 40 MG tablet Take 40 mg by mouth daily.   Yes [provider]  senna (SENOKOT) 8.6 MG TABS tablet Take 1 tablet by mouth daily as needed for mild constipation.  Yes [provider]  Specialty Vitamins Products (BILBERRY PLUS PO) Take 1 capsule by mouth daily.   Yes [provider]  spironolactone (ALDACTONE) 25 MG tablet TAKE 1/2 TABLET BY MOUTH EVERY DAY 02/08/21  Yes Satira Sark, MD  Zinc Oxide 10 % OINT Apply 1 application topically daily as needed (buttocks).   Yes [provider]     Allergies:    No Known Allergies   Physical Exam:   Vitals  Blood pressure 111/77, pulse 88, temperature (!) 97.5 F (36.4 C), resp. rate 17, height '5\' 1"'$  (1.549 m), weight 51.7 kg, SpO2 96 %.  1.  General: Patient lying supine in bed,  no acute distress   2. Psychiatric: Alert and oriented x 3, mood and behavior normal for situation, pleasant and cooperative with exam   3. Neurologic: Speech and language are normal, face is symmetric, moves all 4 extremities voluntarily, at baseline without acute deficits on limited exam   4. HEENMT:  Head is atraumatic, normocephalic, pupils reactive to light, neck is cachectic, trachea is midline, mucous membranes are mildly dry  5. Respiratory : Lungs are clear to auscultation bilaterally without wheezing, rhonchi, rales, no cyanosis, no increase in work of breathing or accessory muscle use   6. Cardiovascular : Heart rate normal, rhythm is regular, no murmurs, rubs or gallops, no peripheral edema, peripheral pulses palpated   7. Gastrointestinal:  Abdomen is soft, distended, nontender to palpation bowel sounds active, no masses or organomegaly palpated   8. Skin:  Skin is warm, dry and intact without rashes, acute lesions, or ulcers on limited exam    9.Musculoskeletal:  No acute deformities or trauma, no asymmetry in tone, no peripheral edema, peripheral pulses palpated, no tenderness to palpation in the extremities     Data Review:    CBC Recent Labs  Lab 06/24/21 1711  WBC 11.1*  HGB 14.1  HCT 40.3  PLT 198  MCV 96.0  MCH 33.6  MCHC 35.0  RDW 13.3  LYMPHSABS 1.0  MONOABS 0.6  EOSABS 0.1  BASOSABS 0.0   ------------------------------------------------------------------------------------------------------------------  Results for orders placed or performed during the hospital encounter of 06/24/21 (from the past 48 hour(s))  Comprehensive metabolic panel     Status: Abnormal   Collection Time: 06/24/21  5:11 PM  Result Value Ref Range   Sodium 124 (L) 135 - 145 mmol/L   Potassium 4.1 3.5 - 5.1 mmol/L   Chloride 85 (L) 98 - 111 mmol/L   CO2 27 22 - 32 mmol/L   Glucose, Bld 188 (H) 70 - 99 mg/dL    Comment: Glucose reference range applies only to samples taken after fasting for at least 8 hours.   BUN 27 (H) 8 - 23 mg/dL   Creatinine, Ser 1.25 (H) 0.44 - 1.00 mg/dL   Calcium 8.6 (L) 8.9 - 10.3 mg/dL   Total Protein 6.5 6.5 - 8.1 g/dL   Albumin 3.4 (L) 3.5 - 5.0 g/dL   AST 28 15 - 41 U/L   ALT 19 0 - 44 U/L   Alkaline Phosphatase 110 38 - 126 U/L   Total Bilirubin 1.0 0.3 - 1.2 mg/dL   GFR, Estimated 41 (L) >60 mL/min    Comment: (NOTE) Calculated using the CKD-EPI Creatinine Equation (2021)    Anion gap 12 5 - 15    Comment: Performed at [facility], [address], [place], [place] [address]  CBC with Differential     Status: Abnormal   Collection  Time: 06/24/21  5:11 PM  Result Value Ref Range   WBC 11.1 (H) 4.0 - 10.5 K/uL   RBC 4.20 3.87 - 5.11 MIL/uL   Hemoglobin 14.1 12.0 - 15.0 g/dL   HCT 40.3 36.0 - 46.0 %   MCV 96.0 80.0 - 100.0 fL   MCH 33.6 26.0 - 34.0 pg   MCHC 35.0 30.0 - 36.0 g/dL   RDW 13.3 11.5 - 15.5 %   Platelets 198 150 - 400 K/uL   nRBC 0.0 0.0 - 0.2 %   Neutrophils Relative % 84  %   Neutro Abs 9.4 (H) 1.7 - 7.7 K/uL   Lymphocytes Relative 9 %   Lymphs Abs 1.0 0.7 - 4.0 K/uL   Monocytes Relative 5 %   Monocytes Absolute 0.6 0.1 - 1.0 K/uL   Eosinophils Relative 1 %   Eosinophils Absolute 0.1 0.0 - 0.5 K/uL   Basophils Relative 0 %   Basophils Absolute 0.0 0.0 - 0.1 K/uL   Immature Granulocytes 1 %   Abs Immature Granulocytes 0.07 0.00 - 0.07 K/uL    Comment: Performed at [facility], [address] [place], [place] [address]  Lactic acid, plasma     Status: Abnormal   Collection Time: 06/24/21  5:11 PM  Result Value Ref Range   Lactic Acid, Venous 2.8 (HH) 0.5 - 1.9 mmol/L    Comment: CRITICAL RESULT CALLED TO, READ BACK BY AND VERIFIED WITH: MYRICK,B ON 06/24/21 AT 1800 BY LOY,C Performed at [facility], [address], [place], [place] [address]   Lipase, blood     Status: Abnormal   Collection Time: 06/24/21  5:11 PM  Result Value Ref Range   Lipase 64 (H) 11 - 51 U/L    Comment: Performed at [facility], [address], [place], [place] [address]  Lactic acid, plasma     Status: Abnormal   Collection Time: 06/24/21  6:32 PM  Result Value Ref Range   Lactic Acid, Venous 2.8 (HH) 0.5 - 1.9 mmol/L    Comment: CRITICAL VALUE NOTED.  VALUE IS CONSISTENT WITH PREVIOUSLY REPORTED AND CALLED VALUE. Performed at [facility], [address] [place], [place] [address]   Protime-INR     Status: Abnormal   Collection Time: 06/24/21  6:32 PM  Result Value Ref Range   Prothrombin Time 15.5 (H) 11.4 - 15.2 seconds   INR 1.2 0.8 - 1.2    Comment: (NOTE) INR goal varies based on device and disease states. Performed at [facility], [address], [place], [place] [address]   APTT     Status: None   Collection Time: 06/24/21  6:32 PM  Result Value Ref Range   aPTT 27 24 - 36 seconds    Comment: Performed at [facility], [address], [place], [place] [address]  Lactic acid, plasma     Status: None   Collection Time: 06/24/21 11:37 PM  Result Value Ref  Range   Lactic Acid, Venous 1.5 0.5 - 1.9 mmol/L    Comment: Performed at [facility], [address], [place],  [address]    Chemistries  Recent Labs  Lab 06/24/21 1711  NA 124*  K 4.1  CL 85*  CO2 27  GLUCOSE 188*  BUN 27*  CREATININE 1.25*  CALCIUM 8.6*  AST 28  ALT 19  ALKPHOS 110  BILITOT 1.0   ------------------------------------------------------------------------------------------------------------------  ------------------------------------------------------------------------------------------------------------------ GFR: Estimated Creatinine Clearance: 22.6 mL/min (A) (by C-G formula based on SCr of 1.25 mg/dL (  H)). Liver Function Tests: Recent Labs  Lab 06/24/21 1711  AST 28  ALT 19  ALKPHOS 110  BILITOT 1.0  PROT 6.5  ALBUMIN 3.4*   Recent Labs  Lab 06/24/21 1711  LIPASE 64*   No results for input(s): AMMONIA in the last 168 hours. Coagulation Profile: Recent Labs  Lab 06/24/21 1832  INR 1.2   Cardiac Enzymes: No results for input(s): CKTOTAL, CKMB, CKMBINDEX, TROPONINI in the last 168 hours. BNP (last 3 results) No results for input(s): PROBNP in the last 8760 hours. HbA1C: No results for input(s): HGBA1C in the last 72 hours. CBG: No results for input(s): GLUCAP in the last 168 hours. Lipid Profile: No results for input(s): CHOL, HDL, LDLCALC, TRIG, CHOLHDL, LDLDIRECT in the last 72 hours. Thyroid Function Tests: No results for input(s): TSH, T4TOTAL, FREET4, T3FREE, THYROIDAB in the last 72 hours. Anemia Panel: No results for input(s): VITAMINB12, FOLATE, FERRITIN, TIBC, IRON, RETICCTPCT in the last 72 hours.  --------------------------------------------------------------------------------------------------------------- Urine analysis:    Component Value Date/Time   COLORURINE YELLOW 01/28/2019 1522   APPEARANCEUR CLEAR 01/28/2019 1522   LABSPEC 1.038 (H) 01/28/2019 1522   PHURINE 5.0 01/28/2019 1522   GLUCOSEU NEGATIVE  01/28/2019 1522   HGBUR NEGATIVE 01/28/2019 1522   BILIRUBINUR NEGATIVE 01/28/2019 1522   KETONESUR 5 (A) 01/28/2019 1522   PROTEINUR NEGATIVE 01/28/2019 1522   UROBILINOGEN 0.2 12/10/2010 1555   NITRITE NEGATIVE 01/28/2019 1522   LEUKOCYTESUR NEGATIVE 01/28/2019 1522      Imaging Results:    CT ABDOMEN PELVIS W CONTRAST  Result Date: 06/24/2021 CLINICAL DATA:  Abdominal distension EXAM: CT ABDOMEN AND PELVIS WITH CONTRAST TECHNIQUE: Multidetector CT imaging of the abdomen and pelvis was performed using the standard protocol following bolus administration of intravenous contrast. CONTRAST:  80m OMNIPAQUE IOHEXOL 350 MG/ML SOLN COMPARISON:  CT 01/28/2019 FINDINGS: Lower chest: Bibasilar atelectasis. Heart size is mildly enlarged. Coronary artery calcifications are seen. Hepatobiliary: No focal liver abnormality is seen. No gallstones, gallbladder wall thickening, or biliary dilatation. Pancreas: Mildly atrophic, but appears grossly unremarkable. Spleen: Normal in size without focal abnormality. Adrenals/Urinary Tract: Unremarkable adrenal glands. Kidneys enhance symmetrically without focal lesion, stone, or hydronephrosis. Ureters are nondilated. Urinary bladder appears unremarkable. Stomach/Bowel: Markedly dilated stomach with fluid and air. No evidence of mechanical outlet obstruction. There is a moderate-sized rectal stool ball. Short segment area of narrowing at the rectosigmoid junction with a moderately dilated distal sigmoid colon. Moderate volume of stool throughout the remaining colon. What appears to be a normal appendix is seen in the right lower quadrant (series 4, image 51). No dilated loops of small bowel are seen within the abdomen. Vascular/Lymphatic: Atherosclerotic calcifications are present throughout the aortoiliac axis. No abdominopelvic lymphadenopathy. Reproductive: Uterus and bilateral adnexa are unremarkable. Other: No free fluid. No abdominopelvic fluid collection. No  pneumoperitoneum. No abdominal wall hernia. Musculoskeletal: Diffuse osseous demineralization. Vertebra plana deformity of the T10 vertebral body, progressed from 2020. Mild chronic L2 compression deformity. IMPRESSION: 1. Markedly dilated stomach with fluid and air. No evidence of mechanical outlet obstruction. 2. Short segment area of narrowing at the rectosigmoid junction with a moderately dilated distal sigmoid colon. Findings may represent a focal area of peristalsis, although a colonic mass is not entirely excluded. A follow-up colonoscopy could be considered. 3. Moderate-sized rectal stool ball. 4. Vertebra plana deformity of the T10 vertebral body, progressed from 2020. Mild chronic L2 compression deformity. 5. Aortic atherosclerosis (ICD10-I70.0). Electronically Signed   By: NDavina PokeD.O.   On:  06/24/2021 20:07   DG CHEST PORT 1 VIEW  Result Date: 06/25/2021 CLINICAL DATA:  Cough, NG tube placement EXAM: PORTABLE CHEST 1 VIEW COMPARISON:  01/29/2019 FINDINGS: Lungs are essentially clear.  No pleural effusion or pneumothorax. The heart is normal in size.  Coronary stent. Enteric tube courses into the mid stomach. IMPRESSION: Enteric tube courses into the mid stomach. Electronically Signed   By: Julian Hy M.D.   On: 06/25/2021 02:44   DG Abdomen Acute W/Chest  Result Date: 06/24/2021 CLINICAL DATA:  Abdominal pain and distension. EXAM: DG ABDOMEN ACUTE WITH 1 VIEW CHEST COMPARISON:  CT 01/28/2019 FINDINGS: Anti lordotic positioning. Low lung volumes. Borderline cardiomegaly with coronary stent. No acute airspace disease or pleural effusion. No pneumothorax. No free intra-abdominal air. Marked gaseous gastric distension, similar gastric distension seen on prior exam when the stomach is fluid-filled. Large volume of colonic stool. There is also likely gaseous distension of transverse colon, although air-filled structure coursing across the mid abdomen may be distended stomach. No obvious  small bowel dilatation. Prominent vascular calcifications are seen. The bones are diffusely under mineralized. No obvious acute osseous abnormality. Surgical clips in the right breast. IMPRESSION: 1. Marked gaseous gastric distension. The stomach was previously prominently distended with fluid. Air-filled structure coursing transversely across the mid abdomen may be gaseous distension of transverse colon or gastric distension. 2. Large volume of colonic stool. 3. Low lung volumes without acute abnormality in the chest. Electronically Signed   By: Keith Rake M.D.   On: 06/24/2021 17:59    My personal review of EKG: Rhythm NSR, Rate rate of 72, QTc 432, left bundle branch block   Assessment & Plan:    Principal Problem:   Gastric distention Active Problems:   AKI (acute kidney injury) ([place])   Hyponatremia   Mild protein-calorie malnutrition (HCC)   Hypotension   Abdominal distention CT scan shows dilated stomach, no outlet obstruction NG tube placed; n.p.o. GI consult in the a.m. Morphine for pain when patient has had this in the past she had peptic ulcer disease, Protonix twice daily Continue to monitor AKI Creatinine increased from 0.92-1.25 Most likely secondary to poor p.o. intake Continue IV hydration Hold Lasix Trend in the a.m. Hypotension with a diagnosis of hypertension For low blood pressures most likely secondary to dehydration Hold Lasix Hold Coreg Continue IV fluids Patient did have a lactic acidosis as well, without a significant leukocytosis with white blood cell count of 11.1, started on vancomycin cefepime, Flagyl Check procalcitonin Blood cultures pending Continue to monitor Hyponatremia Continue IV hydration Trend in the a.m. Corrects to 126 for hyperglycemia Trend in the a.m. Mild protein calorie malnutrition Patient is able to tolerate p.o., start feeding supplement    DVT Prophylaxis-   Heparin- SCDs   AM Labs Ordered, also please review Full  Orders  Family Communication: Admission, patients condition and plan of care including tests being ordered have been discussed with the patient and son who indicate understanding and agree with the plan and Code Status.  Code Status: DNR  Admission status: Inpatient :The appropriate admission status for this patient is INPATIENT. Inpatient status is judged to be reasonable and necessary in order to provide the required intensity of service to ensure the patient's safety. The patient's presenting symptoms, physical exam findings, and initial radiographic and laboratory data in the context of their chronic comorbidities is felt to place them at high risk for further clinical deterioration. Furthermore, it is not anticipated that the patient will  be medically stable for discharge from the hospital within 2 midnights of admission. The following factors support the admission status of inpatient.     The patient's presenting symptoms include back pain. The worrisome physical exam findings include abdominal distention. The initial radiographic and laboratory data are worrisome because of gastric distention. The chronic co-morbidities include hypertension.       * I certify that at the point of admission it is my clinical judgment that the patient will require inpatient hospital care spanning beyond 2 midnights from the point of admission due to high intensity of service, high risk for further deterioration and high frequency of surveillance required.*  Time spent in minutes : [place]

## 2021-06-25 NOTE — Progress Notes (Signed)
Patient seen and evaluated, chart reviewed, please see EMR for updated orders. Please see full H&P dictated by admitting physician Dr Clearence Ped for same date of service.   Brief Summary:- 87 y.o. female, with history of coronary artery disease/Prior MI/ischemic cardiomyopathy and HTN--admitted on 06/25/2021 with abdominal pain nausea and vomiting and found to have significant gastric distention on CT abdomen and pelvis  A/p 1)Abdominal pain with Nausea and Vomiting--- significant gastric distention on CT abdomen and Pelvis -Continue NG tube---- -IV fluids and PPI -GI consult appreciated  2)-Short segment area of Narrowing at the Rectosigmoid junction with a moderately dilated distal sigmoid colon--- rectal stool ball noted on CT abdomen and pelvis as well  -- Cannot rule out  sigmoid stricture/malignancy-----GI consult appreciated may benefit from flexible sigmoidoscopy -Continue Enemas and Dulcolax suppository, as well as attempt at disimpaction -  3)Dehydration/AKI/HypoMagnesiemia/HypoKalemia--- in the setting of poor oral intake and nausea vomiting----continue IV fluids  4)TRansiet Hypotension--suspect due to dehydration and AKI as noted above in #3 brown and frank sepsis AM Cortisol is 26.2  --WBC 11.1 >> 10.4 PCT is 0.23 Lactic acid 2.8 >>1.5 (lactic acidosis may be due to dehydration and poor tissue perfusion) -Patient was empirically started on Vanco, cefepime and Flagyl -Okay to stop vancomycin, continue cefepime and Flagyl pending further culture data---  Discussed with Dr. Abbey Chatters from GI service---  -Total care time is 47 minutes   Patient seen and evaluated, chart reviewed, please see EMR for updated orders. Please see full H&P dictated by admitting physician Dr Clearence Ped for same date of service.  Roxan Hockey, MD

## 2021-06-26 ENCOUNTER — Inpatient Hospital Stay (HOSPITAL_COMMUNITY): Payer: PPO

## 2021-06-26 DIAGNOSIS — R935 Abnormal findings on diagnostic imaging of other abdominal regions, including retroperitoneum: Secondary | ICD-10-CM | POA: Diagnosis not present

## 2021-06-26 DIAGNOSIS — K3189 Other diseases of stomach and duodenum: Secondary | ICD-10-CM | POA: Diagnosis not present

## 2021-06-26 LAB — CBC
HCT: 33.6 % — ABNORMAL LOW (ref 36.0–46.0)
Hemoglobin: 11.6 g/dL — ABNORMAL LOW (ref 12.0–15.0)
MCH: 33.7 pg (ref 26.0–34.0)
MCHC: 34.5 g/dL (ref 30.0–36.0)
MCV: 97.7 fL (ref 80.0–100.0)
Platelets: 156 10*3/uL (ref 150–400)
RBC: 3.44 MIL/uL — ABNORMAL LOW (ref 3.87–5.11)
RDW: 13.7 % (ref 11.5–15.5)
WBC: 8.1 10*3/uL (ref 4.0–10.5)
nRBC: 0 % (ref 0.0–0.2)

## 2021-06-26 LAB — COMPREHENSIVE METABOLIC PANEL
ALT: 14 U/L (ref 0–44)
AST: 23 U/L (ref 15–41)
Albumin: 2.6 g/dL — ABNORMAL LOW (ref 3.5–5.0)
Alkaline Phosphatase: 78 U/L (ref 38–126)
Anion gap: 6 (ref 5–15)
BUN: 18 mg/dL (ref 8–23)
CO2: 21 mmol/L — ABNORMAL LOW (ref 22–32)
Calcium: 7.8 mg/dL — ABNORMAL LOW (ref 8.9–10.3)
Chloride: 102 mmol/L (ref 98–111)
Creatinine, Ser: 0.74 mg/dL (ref 0.44–1.00)
GFR, Estimated: >60 mL/min (ref >60)
Glucose, Bld: 164 mg/dL — ABNORMAL HIGH (ref 70–99)
Potassium: 3.7 mmol/L (ref 3.5–5.1)
Sodium: 129 mmol/L — ABNORMAL LOW (ref 135–145)
Total Bilirubin: 0.6 mg/dL (ref 0.3–1.2)
Total Protein: 5.3 g/dL — ABNORMAL LOW (ref 6.5–8.1)

## 2021-06-26 LAB — GLUCOSE, CAPILLARY
Glucose-Capillary: 154 mg/dL — ABNORMAL HIGH (ref 70–99)
Glucose-Capillary: 141 mg/dL — ABNORMAL HIGH (ref 70–99)
Glucose-Capillary: 112 mg/dL — ABNORMAL HIGH (ref 70–99)

## 2021-06-26 MED ORDER — HEPARIN SODIUM (PORCINE) 5000 UNIT/ML IJ SOLN: 5000.0000 [IU] | Freq: Three times a day (TID) | INTRAMUSCULAR | Status: DC

## 2021-06-26 MED ORDER — BISACODYL 10 MG RE SUPP
10.0000 mg | Freq: Once | RECTAL | Status: AC
  Administered 2021-06-26: 10 mg via RECTAL
  Filled 2021-06-26: qty 1

## 2021-06-26 NOTE — Progress Notes (Signed)
Subjective: Patient doing about the same today.  Per RN she pulled her NG tube this morning though had about 200 cc out from it.  Discussed with Dr. Denton Brick who performed disimpaction today.  Noted soft stool in the rectal vault.  No hard balls.  She has received 3 enemas and 2 Dulcolax suppositories over the last 24 hours.  Objective: Vital signs in last 24 hours: Temp:  [97.4 F (36.3 C)-98.2 F (36.8 C)] 97.4 F (36.3 C) (08/14 0600) Pulse Rate:  [60-79] 69 (08/14 0600) Resp:  [12-20] 18 (08/14 0600) BP: (93-139)/(63-86) 139/82 (08/14 0600) SpO2:  [95 %-98 %] 98 % (08/14 0600) Last BM Date: 06/26/21 General:   Alert and oriented, pleasant, elderly female Head:  Normocephalic and atraumatic. Eyes:  No icterus, sclera clear. Conjuctiva pink.  Mouth:  Without lesions, mucosa pink and moist.  Abdomen:  Bowel sounds present, soft, non-tender, non-distended. No HSM or hernias noted. No rebound or guarding. No masses appreciated  Msk:  Symmetrical without gross deformities. Normal posture. Extremities:  Without clubbing or edema. Neurologic:  Alert and  oriented x4;  grossly normal neurologically. Skin:  Warm and dry, intact without significant lesions.  Cervical Nodes:  No significant cervical adenopathy. Psych:  Alert and cooperative. Normal mood and affect.  Intake/Output from previous day: 08/13 0701 - 08/14 0700 In: 2885.8 [I.V.:2458.4; IV Piggyback:427.5] Out: 200 [Emesis/NG output:100] Intake/Output this shift: No intake/output data recorded.  Lab Results: Recent Labs    06/24/21 1711 06/25/21 0455 06/26/21 0554  WBC 11.1* 10.4 8.1  HGB 14.1 12.2 11.6*  HCT 40.3 33.8* 33.6*  PLT 198 170 156   BMET Recent Labs    06/24/21 1711 06/25/21 0455 06/26/21 0554  NA 124* 128* 129*  K 4.1 3.0* 3.7  CL 85* 97* 102  CO2 27 23 21*  GLUCOSE 188* 100* 164*  BUN 27* 32* 18  CREATININE 1.25* 0.85 0.74  CALCIUM 8.6* 8.0* 7.8*   LFT Recent Labs    06/24/21 1711  06/25/21 0455 06/26/21 0554  PROT 6.5 5.6* 5.3*  ALBUMIN 3.4* 2.9* 2.6*  AST '28 23 23  '$ ALT '19 16 14  '$ ALKPHOS 110 89 78  BILITOT 1.0 1.1 0.6   PT/INR Recent Labs    06/24/21 1832 06/25/21 0455  LABPROT 15.5* 15.1  INR 1.2 1.2   Hepatitis Panel No results for input(s): HEPBSAG, HCVAB, HEPAIGM, HEPBIGM in the last 72 hours.   Studies/Results: CT ABDOMEN PELVIS W CONTRAST  Result Date: 06/24/2021 CLINICAL DATA:  Abdominal distension EXAM: CT ABDOMEN AND PELVIS WITH CONTRAST TECHNIQUE: Multidetector CT imaging of the abdomen and pelvis was performed using the standard protocol following bolus administration of intravenous contrast. CONTRAST:  56m OMNIPAQUE IOHEXOL 350 MG/ML SOLN COMPARISON:  CT 01/28/2019 FINDINGS: Lower chest: Bibasilar atelectasis. Heart size is mildly enlarged. Coronary artery calcifications are seen. Hepatobiliary: No focal liver abnormality is seen. No gallstones, gallbladder wall thickening, or biliary dilatation. Pancreas: Mildly atrophic, but appears grossly unremarkable. Spleen: Normal in size without focal abnormality. Adrenals/Urinary Tract: Unremarkable adrenal glands. Kidneys enhance symmetrically without focal lesion, stone, or hydronephrosis. Ureters are nondilated. Urinary bladder appears unremarkable. Stomach/Bowel: Markedly dilated stomach with fluid and air. No evidence of mechanical outlet obstruction. There is a moderate-sized rectal stool ball. Short segment area of narrowing at the rectosigmoid junction with a moderately dilated distal sigmoid colon. Moderate volume of stool throughout the remaining colon. What appears to be a normal appendix is seen in the right lower quadrant (series 4, image 51).  No dilated loops of small bowel are seen within the abdomen. Vascular/Lymphatic: Atherosclerotic calcifications are present throughout the aortoiliac axis. No abdominopelvic lymphadenopathy. Reproductive: Uterus and bilateral adnexa are unremarkable. Other: No  free fluid. No abdominopelvic fluid collection. No pneumoperitoneum. No abdominal wall hernia. Musculoskeletal: Diffuse osseous demineralization. Vertebra plana deformity of the T10 vertebral body, progressed from 2020. Mild chronic L2 compression deformity. IMPRESSION: 1. Markedly dilated stomach with fluid and air. No evidence of mechanical outlet obstruction. 2. Short segment area of narrowing at the rectosigmoid junction with a moderately dilated distal sigmoid colon. Findings may represent a focal area of peristalsis, although a colonic mass is not entirely excluded. A follow-up colonoscopy could be considered. 3. Moderate-sized rectal stool ball. 4. Vertebra plana deformity of the T10 vertebral body, progressed from 2020. Mild chronic L2 compression deformity. 5. Aortic atherosclerosis (ICD10-I70.0). Electronically Signed   By: Davina Poke D.O.   On: 06/24/2021 20:07   DG CHEST PORT 1 VIEW  Result Date: 06/26/2021 CLINICAL DATA:  NG tube placement EXAM: PORTABLE CHEST 1 VIEW COMPARISON:  None. FINDINGS: NG tube with tip in the gastric body and side port below the GE junction. Low lung volumes.  Cardiac stent noted. IMPRESSION: NG tube with tip in stomach. Electronically Signed   By: Suzy Bouchard M.D.   On: 06/26/2021 09:17   DG CHEST PORT 1 VIEW  Result Date: 06/25/2021 CLINICAL DATA:  Cough, NG tube placement EXAM: PORTABLE CHEST 1 VIEW COMPARISON:  01/29/2019 FINDINGS: Lungs are essentially clear.  No pleural effusion or pneumothorax. The heart is normal in size.  Coronary stent. Enteric tube courses into the mid stomach. IMPRESSION: Enteric tube courses into the mid stomach. Electronically Signed   By: Julian Hy M.D.   On: 06/25/2021 02:44   DG ABD ACUTE 2+V W 1V CHEST  Result Date: 06/26/2021 CLINICAL DATA:  87 year old female with abdominal pain. EXAM: DG ABDOMEN ACUTE WITH 1 VIEW CHEST COMPARISON:  CT Abdomen and Pelvis 06/24/2021 and earlier. FINDINGS: Portable upright and  supine views at 0442 hours. Enteric tube has been placed into the stomach, with evidence of resolved gastric distension since the recent CT. Mildly distended urinary bladder with excreted IV contrast. Continued low lung volumes. No pneumothorax or pneumoperitoneum identified. Increased hypo ventilation at the left lung base. Stable cardiac size and mediastinal contours. Gas-filled bowel loops elsewhere in the abdomen and pelvis appear stable from the recent CT. Osteopenia. No acute osseous abnormality identified. IMPRESSION: 1. Enteric tube placed into the stomach with evidence of gastric decompression since the recent CT. 2. Otherwise stable bowel gas pattern.  No pneumoperitoneum. 3. Interval decreased ventilation at the left lung base, perhaps atelectasis. Electronically Signed   By: Genevie Ann M.D.   On: 06/26/2021 05:25   DG Abdomen Acute W/Chest  Result Date: 06/24/2021 CLINICAL DATA:  Abdominal pain and distension. EXAM: DG ABDOMEN ACUTE WITH 1 VIEW CHEST COMPARISON:  CT 01/28/2019 FINDINGS: Anti lordotic positioning. Low lung volumes. Borderline cardiomegaly with coronary stent. No acute airspace disease or pleural effusion. No pneumothorax. No free intra-abdominal air. Marked gaseous gastric distension, similar gastric distension seen on prior exam when the stomach is fluid-filled. Large volume of colonic stool. There is also likely gaseous distension of transverse colon, although air-filled structure coursing across the mid abdomen may be distended stomach. No obvious small bowel dilatation. Prominent vascular calcifications are seen. The bones are diffusely under mineralized. No obvious acute osseous abnormality. Surgical clips in the right breast. IMPRESSION: 1. Marked gaseous gastric  distension. The stomach was previously prominently distended with fluid. Air-filled structure coursing transversely across the mid abdomen may be gaseous distension of transverse colon or gastric distension. 2. Large  volume of colonic stool. 3. Low lung volumes without acute abnormality in the chest. Electronically Signed   By: Keith Rake M.D.   On: 06/24/2021 17:59    Impression: *Abdominal distention and pain *Dilated stomach without outlet obstruction *Rectal stool ball *Sigmoid colon narrowing-peristalsis versus colonic mass   Plan: Patient notes slight improvement with NG tube placement.  Output has decreased.  We will consider further evaluation with EGD.  Possible we will be able to do this tomorrow pending her clinical course.  Continue on IV PPI twice daily.   In regards to her abnormal colon findings, she would likely benefit from a flexible sigmoidoscopy to further evaluate.  Possible she is obstructed from rectal stool ball versus sigmoid stricture/malignancy.   Disimpaction as per HPI.  Appears that her stool has softened up and hopefully will pass shortly.  We will consider flexible sigmoidoscopy to be performed at the same time of EGD to evaluate her abnormal sigmoid findings.  GI to continue to follow   Elon Alas. Abbey Chatters, D.O. Gastroenterology and Hepatology [facility]   LOS: 2 days    06/26/2021, 12:46 PM

## 2021-06-26 NOTE — Progress Notes (Signed)
Patient Demographics:    Debra Doyle, is a 87 y.o. female, DOB - 1929/02/28, [id number]  Admit date - 06/24/2021   Admitting Physician Shamond Skelton Denton Brick, MD  Outpatient Primary MD for the patient is [name], MD  LOS - 2   Chief Complaint  Patient presents with   Abdominal Pain        Subjective:    Debra Doyle today has no fevers, no emesis,  No chest pain,   -Pt Accidentally pulled NG tube out earlier this morning, NG tube replaced -Manual disimpaction performed by me at bedside--- mostly soft stool in rectal vault,  Assessment  & Plan :    Principal Problem:   Gastric distention Active Problems:   AKI (acute kidney injury) ([place])   Hyponatremia   Mild protein-calorie malnutrition (HCC)   Hypotension   Brief Summary:- 87 y.o. female, with history of coronary artery disease/Prior MI/ischemic cardiomyopathy and HTN--admitted on 06/25/2021 with abdominal pain nausea and vomiting and found to have significant gastric distention on CT abdomen and pelvis   A/p 1)Abdominal pain with Nausea and Vomiting--- significant gastric distention on CT abdomen and Pelvis -Continue NG tube----(NG tube replaced on 06/26/21) -c/n IV fluids and PPI -GI consult appreciated -Plans for possible EGD on 06/27/2021   2)-Short segment area of Narrowing at the Rectosigmoid junction with a moderately dilated distal sigmoid colon--- rectal stool ball noted on CT abdomen and pelvis as well  -- Cannot rule out  sigmoid stricture/malignancy----- -GI consult appreciated  Plans for Possible flexible sigmoidoscopy on 06/27/2021 -Continue Enemas and Dulcolax suppository,  -Rectal/Fecal Disimpaction by me at bedside on 06/26/2021 -   3)Dehydration/AKI/HypoMagnesiemia/HypoKalemia/HypoNatremia--- in the setting of poor oral intake and nausea vomiting----continue IV fluids Na 124 >> 129   4)Transiet Hypotension--suspect due  to dehydration and AKI as noted above in #3 above and frank sepsis AM Cortisol is 26.2  --WBC 11.1 >> 10.4>> 8.1 PCT is 0.23 Lactic acid 2.8 >>1.5 (lactic acidosis may be due to dehydration and poor tissue perfusion) -Patient was empirically started on Vanco, cefepime and Flagyl -Stopped vancomycin, - continue cefepime and Flagyl pending further culture data---   Disposition/Need for in-Hospital Stay- patient unable to be discharged at this time due to possible sigmoid stricture/malignancy requiring flexible sigmoidoscopy, gastric distention requiring NG tube, EGD and IV fluids pending return of bowel function and tolerance of oral intake*  Status is: Inpatient  Remains inpatient appropriate because: Please see disposition above  Disposition: The patient is from: Home              Anticipated d/c is to: Home with [facility]              Anticipated d/c date is: 2 days              Patient currently is not medically stable to d/c. Barriers: Not Clinically Stable-   Code Status :  -  Code Status: DNR   Family Communication:   (patient is alert, awake and coherent)  Son Debra Doyle at [phone] Consults  :  Gi  DVT Prophylaxis  :   - SCDs   heparin injection 5,000 Units Start: 06/24/21 2330 SCDs Start: 06/24/21 2330    Lab Results  Component Value Date  PLT 156 06/26/2021    Inpatient Medications  Scheduled Meds:  heparin  5,000 Units Subcutaneous Q8H   lidocaine  1 patch Transdermal Q24H   pantoprazole (PROTONIX) IV  40 mg Intravenous Q12H   sodium phosphate  1 enema Rectal Once   Continuous Infusions:  ceFEPime (MAXIPIME) IV Stopped (06/25/21 1957)   dextrose 5 % and 0.45 % NaCl with KCl 20 mEq/L 125 mL/hr at 06/26/21 0606   metronidazole 500 mg (06/26/21 0938)   PRN Meds:.acetaminophen **OR** acetaminophen, benzonatate, morphine injection, ondansetron **OR** ondansetron (ZOFRAN) IV, phenol    Anti-infectives (From admission, onward)    Start     Dose/Rate Route  Frequency Ordered Stop   06/25/21 1800  ceFEPIme (MAXIPIME) 2 g in sodium chloride 0.9 % 100 mL IVPB        2 g 200 mL/hr over 30 Minutes Intravenous Every 24 hours 06/24/21 1851     06/25/21 1800  vancomycin (VANCOREADY) IVPB 500 mg/100 mL        500 mg 100 mL/hr over 60 Minutes Intravenous Every 24 hours 06/24/21 1856 06/25/21 2359   06/24/21 1830  ceFEPIme (MAXIPIME) 2 g in sodium chloride 0.9 % 100 mL IVPB        2 g 200 mL/hr over 30 Minutes Intravenous  Once 06/24/21 1829 06/24/21 2118   06/24/21 1830  metroNIDAZOLE (FLAGYL) IVPB 500 mg        500 mg 100 mL/hr over 60 Minutes Intravenous Every 8 hours 06/24/21 1829 07/01/21 1759   06/24/21 1830  vancomycin (VANCOCIN) IVPB 1000 mg/200 mL premix        1,000 mg 200 mL/hr over 60 Minutes Intravenous  Once 06/24/21 1829 06/24/21 2118         Objective:   Vitals:   06/25/21 1800 06/25/21 2000 06/25/21 2135 06/26/21 0600  BP: 119/81 119/67 138/65 139/82  Pulse: 71 60 68 69  Resp: '15 13 20 18  '$ Temp:   98.2 F (36.8 C) (!) 97.4 F (36.3 C)  TempSrc:   Oral Oral  SpO2: 95% 96% 96% 98%  Weight:      Height:        Wt Readings from Last 3 Encounters:  06/24/21 51.7 kg  03/31/21 51.7 kg  01/29/20 56.7 kg     Intake/Output Summary (Last 24 hours) at 06/26/2021 1256 Last data filed at 06/26/2021 0900 Gross per 24 hour  Intake 2785.81 ml  Output 200 ml  Net 2585.81 ml     Physical Exam  Gen:- Awake Alert, in no acute distress HEENT:- [address].AT, No sclera icterus Nose-- NG tube with gastric contents Neck-Supple Neck,No JVD,.  Lungs-diminished breath sounds, no wheezing  CV- S1, S2 normal, regular = Abd-diminished bowel sounds, significant abdominal distention, generalized tenderness no rebound  extremity/Skin:- No  edema, pedal pulses present  Psych-affect is appropriate, oriented x3 Neuro-generalized weakness, no new focal deficits, no tremors   Data Review:   Micro Results Recent Results (from the past 240  hour(s))  Blood Culture (routine x 2)     Status: None (Preliminary result)   Collection Time: 06/24/21  6:35 PM   Specimen: Vein; Blood  Result Value Ref Range Status   Specimen Description RIGHT ANTECUBITAL  Final   Special Requests   Final    BOTTLES DRAWN AEROBIC AND ANAEROBIC Blood Culture adequate volume   Culture   Final    NO GROWTH < 12 HOURS Performed at [facility], [address] [place], [place] [address]  Report Status PENDING  Incomplete  Blood Culture (routine x 2)     Status: None (Preliminary result)   Collection Time: 06/24/21  6:42 PM   Specimen: Vein; Blood  Result Value Ref Range Status   Specimen Description LEFT ANTECUBITAL  Final   Special Requests   Final    BOTTLES DRAWN AEROBIC AND ANAEROBIC Blood Culture adequate volume   Culture   Final    NO GROWTH < 12 HOURS Performed at [facility], [address] [place], [place] [address]    Report Status PENDING  Incomplete  SARS CORONAVIRUS 2 (TAT 6-24 HRS) Nasopharyngeal Nasopharyngeal Swab     Status: None   Collection Time: 06/25/21  1:52 AM   Specimen: Nasopharyngeal Swab  Result Value Ref Range Status   SARS Coronavirus 2 NEGATIVE NEGATIVE Final    Comment: (NOTE) SARS-CoV-2 target nucleic acids are NOT DETECTED.  The SARS-CoV-2 RNA is generally detectable in upper and lower respiratory specimens during the acute phase of infection. Negative results do not preclude SARS-CoV-2 infection, do not rule out co-infections with other pathogens, and should not be used as the sole basis for treatment or other patient management decisions. Negative results must be combined with clinical observations, patient history, and epidemiological information. The expected result is Negative.  Fact Sheet for Patients: SugarRoll.be  Fact Sheet for Healthcare Providers: https://www.woods-mathews.com/  This test is not yet approved or cleared by the [place] FDA and   has been authorized for detection and/or diagnosis of SARS-CoV-2 by FDA under an Emergency Use Authorization (EUA). This EUA will remain  in effect (meaning this test can be used) for the duration of the COVID-19 declaration under Se ction 564(b)(1) of the Act, 21 U.S.C. section 360bbb-3(b)(1), unless the authorization is terminated or revoked sooner.  Performed at [facility], [place] [address] [place],  [address]     Radiology Reports CT ABDOMEN PELVIS W CONTRAST  Result Date: 06/24/2021 CLINICAL DATA:  Abdominal distension EXAM: CT ABDOMEN AND PELVIS WITH CONTRAST TECHNIQUE: Multidetector CT imaging of the abdomen and pelvis was performed using the standard protocol following bolus administration of intravenous contrast. CONTRAST:  28m OMNIPAQUE IOHEXOL 350 MG/ML SOLN COMPARISON:  CT 01/28/2019 FINDINGS: Lower chest: Bibasilar atelectasis. Heart size is mildly enlarged. Coronary artery calcifications are seen. Hepatobiliary: No focal liver abnormality is seen. No gallstones, gallbladder wall thickening, or biliary dilatation. Pancreas: Mildly atrophic, but appears grossly unremarkable. Spleen: Normal in size without focal abnormality. Adrenals/Urinary Tract: Unremarkable adrenal glands. Kidneys enhance symmetrically without focal lesion, stone, or hydronephrosis. Ureters are nondilated. Urinary bladder appears unremarkable. Stomach/Bowel: Markedly dilated stomach with fluid and air. No evidence of mechanical outlet obstruction. There is a moderate-sized rectal stool ball. Short segment area of narrowing at the rectosigmoid junction with a moderately dilated distal sigmoid colon. Moderate volume of stool throughout the remaining colon. What appears to be a normal appendix is seen in the right lower quadrant (series 4, image 51). No dilated loops of small bowel are seen within the abdomen. Vascular/Lymphatic: Atherosclerotic calcifications are present throughout the aortoiliac axis. No  abdominopelvic lymphadenopathy. Reproductive: Uterus and bilateral adnexa are unremarkable. Other: No free fluid. No abdominopelvic fluid collection. No pneumoperitoneum. No abdominal wall hernia. Musculoskeletal: Diffuse osseous demineralization. Vertebra plana deformity of the T10 vertebral body, progressed from 2020. Mild chronic L2 compression deformity. IMPRESSION: 1. Markedly dilated stomach with fluid and air. No evidence of mechanical outlet obstruction. 2. Short segment area of narrowing at the rectosigmoid junction with  a moderately dilated distal sigmoid colon. Findings may represent a focal area of peristalsis, although a colonic mass is not entirely excluded. A follow-up colonoscopy could be considered. 3. Moderate-sized rectal stool ball. 4. Vertebra plana deformity of the T10 vertebral body, progressed from 2020. Mild chronic L2 compression deformity. 5. Aortic atherosclerosis (ICD10-I70.0). Electronically Signed   By: Davina Poke D.O.   On: 06/24/2021 20:07   DG CHEST PORT 1 VIEW  Result Date: 06/26/2021 CLINICAL DATA:  NG tube placement EXAM: PORTABLE CHEST 1 VIEW COMPARISON:  None. FINDINGS: NG tube with tip in the gastric body and side port below the GE junction. Low lung volumes.  Cardiac stent noted. IMPRESSION: NG tube with tip in stomach. Electronically Signed   By: Suzy Bouchard M.D.   On: 06/26/2021 09:17   DG CHEST PORT 1 VIEW  Result Date: 06/25/2021 CLINICAL DATA:  Cough, NG tube placement EXAM: PORTABLE CHEST 1 VIEW COMPARISON:  01/29/2019 FINDINGS: Lungs are essentially clear.  No pleural effusion or pneumothorax. The heart is normal in size.  Coronary stent. Enteric tube courses into the mid stomach. IMPRESSION: Enteric tube courses into the mid stomach. Electronically Signed   By: Julian Hy M.D.   On: 06/25/2021 02:44   DG ABD ACUTE 2+V W 1V CHEST  Result Date: 06/26/2021 CLINICAL DATA:  87 year Doyle female with abdominal pain. EXAM: DG ABDOMEN ACUTE WITH 1  VIEW CHEST COMPARISON:  CT Abdomen and Pelvis 06/24/2021 and earlier. FINDINGS: Portable upright and supine views at 0442 hours. Enteric tube has been placed into the stomach, with evidence of resolved gastric distension since the recent CT. Mildly distended urinary bladder with excreted IV contrast. Continued low lung volumes. No pneumothorax or pneumoperitoneum identified. Increased hypo ventilation at the left lung base. Stable cardiac size and mediastinal contours. Gas-filled bowel loops elsewhere in the abdomen and pelvis appear stable from the recent CT. Osteopenia. No acute osseous abnormality identified. IMPRESSION: 1. Enteric tube placed into the stomach with evidence of gastric decompression since the recent CT. 2. Otherwise stable bowel gas pattern.  No pneumoperitoneum. 3. Interval decreased ventilation at the left lung base, perhaps atelectasis. Electronically Signed   By: Genevie Ann M.D.   On: 06/26/2021 05:25   DG Abdomen Acute W/Chest  Result Date: 06/24/2021 CLINICAL DATA:  Abdominal pain and distension. EXAM: DG ABDOMEN ACUTE WITH 1 VIEW CHEST COMPARISON:  CT 01/28/2019 FINDINGS: Anti lordotic positioning. Low lung volumes. Borderline cardiomegaly with coronary stent. No acute airspace disease or pleural effusion. No pneumothorax. No free intra-abdominal air. Marked gaseous gastric distension, similar gastric distension seen on prior exam when the stomach is fluid-filled. Large volume of colonic stool. There is also likely gaseous distension of transverse colon, although air-filled structure coursing across the mid abdomen may be distended stomach. No obvious small bowel dilatation. Prominent vascular calcifications are seen. The bones are diffusely under mineralized. No obvious acute osseous abnormality. Surgical clips in the right breast. IMPRESSION: 1. Marked gaseous gastric distension. The stomach was previously prominently distended with fluid. Air-filled structure coursing transversely  across the mid abdomen may be gaseous distension of transverse colon or gastric distension. 2. Large volume of colonic stool. 3. Low lung volumes without acute abnormality in the chest. Electronically Signed   By: Keith Rake M.D.   On: 06/24/2021 17:59     CBC Recent Labs  Lab 06/24/21 1711 06/25/21 0455 06/26/21 0554  WBC 11.1* 10.4 8.1  HGB 14.1 12.2 11.6*  HCT 40.3 33.8* 33.6*  PLT 198  170 156  MCV 96.0 94.7 97.7  MCH 33.6 34.2* 33.7  MCHC 35.0 36.1* 34.5  RDW 13.3 13.2 13.7  LYMPHSABS 1.0 0.8  --   MONOABS 0.6 0.7  --   EOSABS 0.1 0.0  --   BASOSABS 0.0 0.0  --     Chemistries  Recent Labs  Lab 06/24/21 1711 06/25/21 0455 06/26/21 0554  NA 124* 128* 129*  K 4.1 3.0* 3.7  CL 85* 97* 102  CO2 27 23 21*  GLUCOSE 188* 100* 164*  BUN 27* 32* 18  CREATININE 1.25* 0.85 0.74  CALCIUM 8.6* 8.0* 7.8*  MG  --  1.7  --   AST '28 23 23  '$ ALT '19 16 14  '$ ALKPHOS 110 89 78  BILITOT 1.0 1.1 0.6   ------------------------------------------------------------------------------------------------------------------ No results for input(s): CHOL, HDL, LDLCALC, TRIG, CHOLHDL, LDLDIRECT in the last 72 hours.  Lab Results  Component Value Date   HGBA1C 5.0 11/25/2013   ------------------------------------------------------------------------------------------------------------------ No results for input(s): TSH, T4TOTAL, T3FREE, THYROIDAB in the last 72 hours.  Invalid input(s): FREET3 ------------------------------------------------------------------------------------------------------------------ No results for input(s): VITAMINB12, FOLATE, FERRITIN, TIBC, IRON, RETICCTPCT in the last 72 hours.  Coagulation profile Recent Labs  Lab 06/24/21 1832 06/25/21 0455  INR 1.2 1.2    No results for input(s): DDIMER in the last 72 hours.  Cardiac Enzymes No results for input(s): CKMB, TROPONINI, MYOGLOBIN in the last 168 hours.  Invalid input(s):  CK ------------------------------------------------------------------------------------------------------------------ No results found for: BNP   Roxan Hockey M.D on 06/26/2021 at 12:56 PM  Go to [email or web address] - for contact info  [facility] - Office  [phone]

## 2021-06-26 NOTE — Progress Notes (Signed)
Pharmacy Antibiotic Note  Debra Doyle is a 87 y.o. female admitted on 06/24/2021 with sepsis.  Pharmacy has been consulted for cefepime/vancomycin dosing.  Plan: Cefepime 2g IV q24h based on renal function  Height: '5\' 1"'$  (154.9 cm) Weight: 51.7 kg (114 lb) IBW/kg (Calculated) : 47.8  Temp (24hrs), Avg:97.8 F (36.6 C), Min:97.4 F (36.3 C), Max:98.2 F (36.8 C)  Recent Labs  Lab 06/24/21 1711 06/24/21 1832 06/24/21 2337 06/25/21 0455 06/26/21 0554  WBC 11.1*  --   --  10.4 8.1  CREATININE 1.25*  --   --  0.85 0.74  LATICACIDVEN 2.8* 2.8* 1.5  --   --      Estimated Creatinine Clearance: 35.3 mL/min (by C-G formula based on SCr of 0.74 mg/dL).    No Known Allergies  Antimicrobials this admission: metronidazole '500mg'$  IV q8h (8/12 >> ) Cefepime 8/12 >> Vancomycin initially ordered but not given/d/c'd  Dose adjustments this admission: N/a  Microbiology results: 8/12 BCx: pending 8/12 UCx: pending   Thank you for allowing pharmacy to be a part of this patient's care.  Hart Robinsons A 06/26/2021 7:59 AM

## 2021-06-26 NOTE — Progress Notes (Signed)
Patient pulled NG tube out. MD Courage aware. Placed 12 fr to right nare.

## 2021-06-27 ENCOUNTER — Inpatient Hospital Stay (HOSPITAL_COMMUNITY): Payer: PPO

## 2021-06-27 DIAGNOSIS — R1084 Generalized abdominal pain: Secondary | ICD-10-CM | POA: Diagnosis not present

## 2021-06-27 DIAGNOSIS — K3189 Other diseases of stomach and duodenum: Secondary | ICD-10-CM | POA: Diagnosis not present

## 2021-06-27 LAB — GLUCOSE, CAPILLARY
Glucose-Capillary: 100 mg/dL — ABNORMAL HIGH (ref 70–99)
Glucose-Capillary: 100 mg/dL — ABNORMAL HIGH (ref 70–99)
Glucose-Capillary: 128 mg/dL — ABNORMAL HIGH (ref 70–99)
Glucose-Capillary: 87 mg/dL (ref 70–99)
Glucose-Capillary: 96 mg/dL (ref 70–99)

## 2021-06-27 LAB — TYPE AND SCREEN
ABO/RH(D): O POS
Antibody Screen: NEGATIVE

## 2021-06-27 LAB — HEMOGLOBIN AND HEMATOCRIT, BLOOD
HCT: 34.8 % — ABNORMAL LOW (ref 36.0–46.0)
Hemoglobin: 12.4 g/dL (ref 12.0–15.0)

## 2021-06-27 MED ORDER — HEPARIN SODIUM (PORCINE) 5000 UNIT/ML IJ SOLN
5000.0000 [IU] | Freq: Three times a day (TID) | INTRAMUSCULAR | Status: DC
  Filled 2021-06-27: qty 1

## 2021-06-27 MED ORDER — HEPARIN SODIUM (PORCINE) 5000 UNIT/ML IJ SOLN: 5000.0000 [IU] | Freq: Three times a day (TID) | INTRAMUSCULAR | Status: DC

## 2021-06-27 MED ORDER — BISACODYL 10 MG RE SUPP
10.0000 mg | Freq: Once | RECTAL | Status: AC
  Administered 2021-06-27: 10 mg via RECTAL
  Filled 2021-06-27: qty 1

## 2021-06-27 NOTE — Progress Notes (Signed)
Spoke with patient's son and reviewed EGD and flex sig. Reviewed risks and benefits. Tentatively planning for 8/16. Enemas and suppository ordered for today and again tomorrow morning prior to procedure.   Annitta Needs, PhD, ANP-BC [facility]

## 2021-06-27 NOTE — Progress Notes (Signed)
Cross-coverage note:   Notified of dark red drainage from NGT.   Plan to hold sq heparin, use SCDs instead, check drainage for gastric occult blood, and check H&H.

## 2021-06-27 NOTE — Progress Notes (Addendum)
Subjective: No N/V. Feels abdomen is less distended. Last BM yesterday. Manually disimpacted at bedside by hospitalist yesterday. Last documented output from NG tube was 300 ml at 1600 yesterday. Now with canister 600 ml of dark brown/rust colored output. She does not desire procedures today and requesting tomorrow.   Objective: Vital signs in last 24 hours: Temp:  [97.8 F (36.6 C)-98 F (36.7 C)] 98 F (36.7 C) (08/15 0611) Pulse Rate:  [68-87] 87 (08/15 0611) Resp:  [18] 18 (08/15 0611) BP: (130-175)/(78-108) 168/108 (08/15 0611) SpO2:  [97 %-98 %] 98 % (08/15 0611) Last BM Date: 06/26/21 General:   Alert and oriented, pleasant, frail, chronically ill-appearing Head:  Normocephalic and atraumatic. Lungs: Clear to auscultation bilaterally, without wheezing, rales, or rhonchi.  Abdomen:  Bowel sounds present, distended and round but soft, no TTP, no rebound or guarding Neurologic:  Alert and  oriented x4    Intake/Output from previous day: 08/14 0701 - 08/15 0700 In: 0  Out: 300 [Emesis/NG output:300] Intake/Output this shift: No intake/output data recorded.  Lab Results: Recent Labs    06/24/21 1711 06/25/21 0455 06/26/21 0554  WBC 11.1* 10.4 8.1  HGB 14.1 12.2 11.6*  HCT 40.3 33.8* 33.6*  PLT 198 170 156   BMET Recent Labs    06/24/21 1711 06/25/21 0455 06/26/21 0554  NA 124* 128* 129*  K 4.1 3.0* 3.7  CL 85* 97* 102  CO2 27 23 21*  GLUCOSE 188* 100* 164*  BUN 27* 32* 18  CREATININE 1.25* 0.85 0.74  CALCIUM 8.6* 8.0* 7.8*   LFT Recent Labs    06/24/21 1711 06/25/21 0455 06/26/21 0554  PROT 6.5 5.6* 5.3*  ALBUMIN 3.4* 2.9* 2.6*  AST '28 23 23  '$ ALT '19 16 14  '$ ALKPHOS 110 89 78  BILITOT 1.0 1.1 0.6   PT/INR Recent Labs    06/24/21 1832 06/25/21 0455  LABPROT 15.5* 15.1  INR 1.2 1.2     Studies/Results: DG CHEST PORT 1 VIEW  Result Date: 06/26/2021 CLINICAL DATA:  NG tube placement EXAM: PORTABLE CHEST 1 VIEW COMPARISON:  None.  FINDINGS: NG tube with tip in the gastric body and side port below the GE junction. Low lung volumes.  Cardiac stent noted. IMPRESSION: NG tube with tip in stomach. Electronically Signed   By: Suzy Bouchard M.D.   On: 06/26/2021 09:17   DG ABD ACUTE 2+V W 1V CHEST  Result Date: 06/27/2021 CLINICAL DATA:  Abdominal pain and gastric distension. Patient with severe pain throughout. Hx of HTN and MI. Negative covid-19 test x 3 days ago. EXAM: DG ABDOMEN ACUTE WITH 1 VIEW CHEST COMPARISON:  the previous day's study FINDINGS: Low lung volumes with patchy subsegmental atelectasis at the lung bases. Heart size upper limits normal for technique. Aortic Atherosclerosis (ICD10-170.0). Coronary stent. Blunting of the left lateral costophrenic angle suggesting small effusion. No pneumothorax. No free air. Normal bowel gas pattern. Nasogastric tube extends into the decompressed stomach. Urinary bladder is distended. Regional bones unremarkable. IMPRESSION: 1. Distended urinary bladder. 2. Low volumes with patchy subsegmental atelectasis at the lung bases, possible small left effusion. 3. For nasogastric tube to the decompressed stomach. Electronically Signed   By: Lucrezia Europe M.D.   On: 06/27/2021 07:29   DG ABD ACUTE 2+V W 1V CHEST  Result Date: 06/26/2021 CLINICAL DATA:  87 year old female with abdominal pain. EXAM: DG ABDOMEN ACUTE WITH 1 VIEW CHEST COMPARISON:  CT Abdomen and Pelvis 06/24/2021 and earlier. FINDINGS: Portable upright and supine  views at 0442 hours. Enteric tube has been placed into the stomach, with evidence of resolved gastric distension since the recent CT. Mildly distended urinary bladder with excreted IV contrast. Continued low lung volumes. No pneumothorax or pneumoperitoneum identified. Increased hypo ventilation at the left lung base. Stable cardiac size and mediastinal contours. Gas-filled bowel loops elsewhere in the abdomen and pelvis appear stable from the recent CT. Osteopenia. No acute  osseous abnormality identified. IMPRESSION: 1. Enteric tube placed into the stomach with evidence of gastric decompression since the recent CT. 2. Otherwise stable bowel gas pattern.  No pneumoperitoneum. 3. Interval decreased ventilation at the left lung base, perhaps atelectasis. Electronically Signed   By: Genevie Ann M.D.   On: 06/26/2021 05:25    Assessment: Pleasant 87 year old female presenting to [facility] on 8[facility]12 with abdominal pain and distension, N/V, with CT abd/pelvis revealing markedly dilated stomach with fluid and air, short segment area of narrowing at rectosigmoid junction with moderately dilated distal sigmoid, moderate-sized rectal stool ball. NG tube placed for decompression with improvement in abdominal distension and symptoms. Fecal disimpaction by hospitalist at bedside yesterday.   Dilated stomach: output not ideally recorded. Spoke with nursing staff. Last recorded output was 300 ml at 1600 yesterday; however, today, her canister is at 600 ml. Recommend keeping NG tube in place. If output decreases, can clamp and follow so as to avoid reinsertion if needed. Needs EGD this admission. Last EGD in 2020 by Dr. Laural Golden with LA Grade D esophagitis and non-bleeding duodenal ulcers. Patient desires to pursue this on 8/16 instead of today.   Narrowing at rectosigmoid: flex sig to be done at time of EGD on 8/16. She was manually disimpacted yesterday. Concern for underlying malignancy. Patient again desires this not to be performed until 8/16.     Plan: Keep NG tube in place, strict I/Os Hold heparin dose tomorrow with plans for EGD and flex sig Continue supportive measures Tap water enemas in am in preparation for flex sig Continue PPI IV BID  Annitta Needs, PhD, ANP-BC [facility]    LOS: 3 days    06/27/2021, 10:56 AM

## 2021-06-27 NOTE — Progress Notes (Signed)
Patient Demographics:    Debra Doyle, is a 87 y.o. female, DOB - Apr 25, 1929, [id number]  Admit date - 06/24/2021   Admitting Physician Fidel Caggiano Denton Brick, MD  Outpatient Primary MD for the patient is [name], MD  LOS - 3   Chief Complaint  Patient presents with   Abdominal Pain        Subjective:    Debra Doyle today has no fevers, no emesis,  No chest pain,   -NG tube remains with high output  Assessment  & Plan :    Principal Problem:   Gastric distention Active Problems:   AKI (acute kidney injury) ([place])   Hyponatremia   Mild protein-calorie malnutrition (H[facility])   Hypotension   Brief Summary:- 87 y.o. female, with history of coronary artery disease/Prior MI/ischemic cardiomyopathy and HTN--admitted on 06/25/2021 with abdominal pain nausea and vomiting and found to have significant gastric distention on CT abdomen and pelvis   A/p 1)Abdominal pain with Nausea and Vomiting--- significant gastric distention on CT abdomen and Pelvis -Continue NG tube----(NG tube replaced on 06/26/21) -c/n IV fluids and PPI -GI consult appreciated -Plans for possible EGD and flex sig on 06/28/2021   2)-Short segment area of Narrowing at the Rectosigmoid junction with a moderately dilated distal sigmoid colon--- rectal stool ball noted on CT abdomen and pelvis as well  -- Cannot rule out  sigmoid stricture/malignancy----- -GI consult appreciated  Plans for Possible flexible sigmoidoscopy on 06/28/2021 -Continue Enemas and Dulcolax suppository,  -Rectal/Fecal Disimpaction by me at bedside on 06/26/2021 -   3)Dehydration/AKI/HypoMagnesiemia/HypoKalemia/HypoNatremia--- in the setting of poor oral intake and nausea vomiting----continue IV fluids Na 124 >> 129   4)Transiet Hypotension--suspect due to dehydration and AKI as noted above in #3 above, rather than frank sepsis AM Cortisol is 26.2  --WBC 11.1 >>  10.4>> 8.1 PCT is 0.23 Lactic acid 2.8 >>1.5 (lactic acidosis may be due to dehydration and poor tissue perfusion) -Patient was empirically started on Vanco, cefepime and Flagyl -Okay to stop vancomycin, cefepime and Flagyl   Disposition/Need for in-Hospital Stay- patient unable to be discharged at this time due to possible sigmoid stricture/malignancy requiring flexible sigmoidoscopy, gastric distention requiring NG tube, EGD and IV - fluids pending return of bowel function and tolerance of oral intake --Plans for possible EGD and flex sig on 06/28/2021  Status is: Inpatient  Remains inpatient appropriate because: Please see disposition above  Disposition: The patient is from: Home              Anticipated d/c is to: Home with HH Vs SNF              Anticipated d/c date is: 2 days              Patient currently is not medically stable to d/c. Barriers: Not Clinically Stable-   Code Status :  -  Code Status: DNR   Family Communication:   (patient is alert, awake and coherent)  Son [name] at [phone] Consults  :  Gi  DVT Prophylaxis  :   - SCDs   heparin injection 5,000 Units Start: 06/28/21 2200 heparin injection 5,000 Units Start: 06/27/21 2200 SCDs Start: 06/24/21 2330    Lab Results  Component Value Date  PLT 156 06/26/2021    Inpatient Medications  Scheduled Meds:  heparin  5,000 Units Subcutaneous Q8H   [START ON 06/28/2021] heparin  5,000 Units Subcutaneous Q8H   lidocaine  1 patch Transdermal Q24H   pantoprazole (PROTONIX) IV  40 mg Intravenous Q12H   sodium phosphate  1 enema Rectal Once   Continuous Infusions:  dextrose 5 % and 0.45 % NaCl with KCl 20 mEq/L 75 mL/hr at 06/27/21 1810   PRN Meds:.acetaminophen **OR** acetaminophen, benzonatate, morphine injection, ondansetron **OR** ondansetron (ZOFRAN) IV, phenol    Anti-infectives (From admission, onward)    Start     Dose/Rate Route Frequency Ordered Stop   06/25/21 1800  ceFEPIme (MAXIPIME)  2 g in sodium chloride 0.9 % 100 mL IVPB  Status:  Discontinued        2 g 200 mL/hr over 30 Minutes Intravenous Every 24 hours 06/24/21 1851 06/27/21 1255   06/25/21 1800  vancomycin (VANCOREADY) IVPB 500 mg/100 mL        500 mg 100 mL/hr over 60 Minutes Intravenous Every 24 hours 06/24/21 1856 06/25/21 2359   06/24/21 1830  ceFEPIme (MAXIPIME) 2 g in sodium chloride 0.9 % 100 mL IVPB        2 g 200 mL/hr over 30 Minutes Intravenous  Once 06/24/21 1829 06/24/21 2118   06/24/21 1830  metroNIDAZOLE (FLAGYL) IVPB 500 mg  Status:  Discontinued        500 mg 100 mL/hr over 60 Minutes Intravenous Every 8 hours 06/24/21 1829 06/27/21 1255   06/24/21 1830  vancomycin (VANCOCIN) IVPB 1000 mg/200 mL premix        1,000 mg 200 mL/hr over 60 Minutes Intravenous  Once 06/24/21 1829 06/24/21 2118         Objective:   Vitals:   06/26/21 1424 06/26/21 2142 06/27/21 0611 06/27/21 1419  BP: 130/78 (!) 175/108 (!) 168/108 (!) 147/86  Pulse: 68 85 87 90  Resp: '18 18 18 20  '$ Temp: 97.8 F (36.6 C) 97.9 F (36.6 C) 98 F (36.7 C) 98.2 F (36.8 C)  TempSrc: Oral Oral Oral Oral  SpO2: 98% 97% 98% 98%  Weight:      Height:        Wt Readings from Last 3 Encounters:  06/24/21 51.7 kg  03/31/21 51.7 kg  01/29/20 56.7 kg     Intake/Output Summary (Last 24 hours) at 06/27/2021 1926 Last data filed at 06/27/2021 1700 Gross per 24 hour  Intake 0 ml  Output --  Net 0 ml     Physical Exam  Gen:- Awake Alert, in no acute distress HEENT:- [place].AT, No sclera icterus Nose-- NG tube with gastric contents Neck-Supple Neck,No JVD,.  Lungs-diminished breath sounds, no wheezing  CV- S1, S2 normal, regular = Abd-diminished bowel sounds, significant abdominal distention, generalized tenderness no rebound  extremity/Skin:- No  edema, pedal pulses present  Psych-affect is appropriate, oriented x3 Neuro-generalized weakness, no new focal deficits, no tremors   Data Review:   Micro Results Recent  Results (from the past 240 hour(s))  Blood Culture (routine x 2)     Status: None (Preliminary result)   Collection Time: 06/24/21  6:35 PM   Specimen: Right Antecubital; Blood  Result Value Ref Range Status   Specimen Description RIGHT ANTECUBITAL  Final   Special Requests   Final    BOTTLES DRAWN AEROBIC AND ANAEROBIC Blood Culture adequate volume   Culture   Final    NO GROWTH 3 DAYS Performed at  [facility], [address] [place], [place] [address]    Report Status PENDING  Incomplete  Blood Culture (routine x 2)     Status: None (Preliminary result)   Collection Time: 06/24/21  6:42 PM   Specimen: Left Antecubital; Blood  Result Value Ref Range Status   Specimen Description LEFT ANTECUBITAL  Final   Special Requests   Final    BOTTLES DRAWN AEROBIC AND ANAEROBIC Blood Culture adequate volume   Culture   Final    NO GROWTH 3 DAYS Performed at [facility], [address], [place], [place] [address]    Report Status PENDING  Incomplete  SARS CORONAVIRUS 2 (TAT 6-24 HRS) Nasopharyngeal Nasopharyngeal Swab     Status: None   Collection Time: 06/25/21  1:52 AM   Specimen: Nasopharyngeal Swab  Result Value Ref Range Status   SARS Coronavirus 2 NEGATIVE NEGATIVE Final    Comment: (NOTE) SARS-CoV-2 target nucleic acids are NOT DETECTED.  The SARS-CoV-2 RNA is generally detectable in upper and lower respiratory specimens during the acute phase of infection. Negative results do not preclude SARS-CoV-2 infection, do not rule out co-infections with other pathogens, and should not be used as the sole basis for treatment or other patient management decisions. Negative results must be combined with clinical observations, patient history, and epidemiological information. The expected result is Negative.  Fact Sheet for Patients: SugarRoll.be  Fact Sheet for Healthcare Providers: https://www.woods-mathews.com/  This test is not yet approved  or cleared by the [place] FDA and  has been authorized for detection and/or diagnosis of SARS-CoV-2 by FDA under an Emergency Use Authorization (EUA). This EUA will remain  in effect (meaning this test can be used) for the duration of the COVID-19 declaration under Se ction 564(b)(1) of the Act, 21 U.S.C. section 360bbb-3(b)(1), unless the authorization is terminated or revoked sooner.  Performed at [facility], [place] [address] [place], [place] [address]     Radiology Reports CT ABDOMEN PELVIS W CONTRAST  Result Date: 06/24/2021 CLINICAL DATA:  Abdominal distension EXAM: CT ABDOMEN AND PELVIS WITH CONTRAST TECHNIQUE: Multidetector CT imaging of the abdomen and pelvis was performed using the standard protocol following bolus administration of intravenous contrast. CONTRAST:  51m OMNIPAQUE IOHEXOL 350 MG/ML SOLN COMPARISON:  CT 01/28/2019 FINDINGS: Lower chest: Bibasilar atelectasis. Heart size is mildly enlarged. Coronary artery calcifications are seen. Hepatobiliary: No focal liver abnormality is seen. No gallstones, gallbladder wall thickening, or biliary dilatation. Pancreas: Mildly atrophic, but appears grossly unremarkable. Spleen: Normal in size without focal abnormality. Adrenals/Urinary Tract: Unremarkable adrenal glands. Kidneys enhance symmetrically without focal lesion, stone, or hydronephrosis. Ureters are nondilated. Urinary bladder appears unremarkable. Stomach/Bowel: Markedly dilated stomach with fluid and air. No evidence of mechanical outlet obstruction. There is a moderate-sized rectal stool ball. Short segment area of narrowing at the rectosigmoid junction with a moderately dilated distal sigmoid colon. Moderate volume of stool throughout the remaining colon. What appears to be a normal appendix is seen in the right lower quadrant (series 4, image 51). No dilated loops of small bowel are seen within the abdomen. Vascular/Lymphatic: Atherosclerotic calcifications are  present throughout the aortoiliac axis. No abdominopelvic lymphadenopathy. Reproductive: Uterus and bilateral adnexa are unremarkable. Other: No free fluid. No abdominopelvic fluid collection. No pneumoperitoneum. No abdominal wall hernia. Musculoskeletal: Diffuse osseous demineralization. Vertebra plana deformity of the T10 vertebral body, progressed from 2020. Mild chronic L2 compression deformity. IMPRESSION: 1. Markedly dilated stomach with fluid and air. No evidence of mechanical outlet  obstruction. 2. Short segment area of narrowing at the rectosigmoid junction with a moderately dilated distal sigmoid colon. Findings may represent a focal area of peristalsis, although a colonic mass is not entirely excluded. A follow-up colonoscopy could be considered. 3. Moderate-sized rectal stool ball. 4. Vertebra plana deformity of the T10 vertebral body, progressed from 2020. Mild chronic L2 compression deformity. 5. Aortic atherosclerosis (ICD10-I70.0). Electronically Signed   By: Davina Poke D.O.   On: 06/24/2021 20:07   DG CHEST PORT 1 VIEW  Result Date: 06/26/2021 CLINICAL DATA:  NG tube placement EXAM: PORTABLE CHEST 1 VIEW COMPARISON:  None. FINDINGS: NG tube with tip in the gastric body and side port below the GE junction. Low lung volumes.  Cardiac stent noted. IMPRESSION: NG tube with tip in stomach. Electronically Signed   By: Suzy Bouchard M.D.   On: 06/26/2021 09:17   DG CHEST PORT 1 VIEW  Result Date: 06/25/2021 CLINICAL DATA:  Cough, NG tube placement EXAM: PORTABLE CHEST 1 VIEW COMPARISON:  01/29/2019 FINDINGS: Lungs are essentially clear.  No pleural effusion or pneumothorax. The heart is normal in size.  Coronary stent. Enteric tube courses into the mid stomach. IMPRESSION: Enteric tube courses into the mid stomach. Electronically Signed   By: Julian Hy M.D.   On: 06/25/2021 02:44   DG ABD ACUTE 2+V W 1V CHEST  Result Date: 06/27/2021 CLINICAL DATA:  Abdominal pain and gastric  distension. Patient with severe pain throughout. Hx of HTN and MI. Negative covid-19 test x 3 days ago. EXAM: DG ABDOMEN ACUTE WITH 1 VIEW CHEST COMPARISON:  the previous day's study FINDINGS: Low lung volumes with patchy subsegmental atelectasis at the lung bases. Heart size upper limits normal for technique. Aortic Atherosclerosis (ICD10-170.0). Coronary stent. Blunting of the left lateral costophrenic angle suggesting small effusion. No pneumothorax. No free air. Normal bowel gas pattern. Nasogastric tube extends into the decompressed stomach. Urinary bladder is distended. Regional bones unremarkable. IMPRESSION: 1. Distended urinary bladder. 2. Low volumes with patchy subsegmental atelectasis at the lung bases, possible small left effusion. 3. For nasogastric tube to the decompressed stomach. Electronically Signed   By: Lucrezia Europe M.D.   On: 06/27/2021 07:29   DG ABD ACUTE 2+V W 1V CHEST  Result Date: 06/26/2021 CLINICAL DATA:  87 year Doyle female with abdominal pain. EXAM: DG ABDOMEN ACUTE WITH 1 VIEW CHEST COMPARISON:  CT Abdomen and Pelvis 06/24/2021 and earlier. FINDINGS: Portable upright and supine views at 0442 hours. Enteric tube has been placed into the stomach, with evidence of resolved gastric distension since the recent CT. Mildly distended urinary bladder with excreted IV contrast. Continued low lung volumes. No pneumothorax or pneumoperitoneum identified. Increased hypo ventilation at the left lung base. Stable cardiac size and mediastinal contours. Gas-filled bowel loops elsewhere in the abdomen and pelvis appear stable from the recent CT. Osteopenia. No acute osseous abnormality identified. IMPRESSION: 1. Enteric tube placed into the stomach with evidence of gastric decompression since the recent CT. 2. Otherwise stable bowel gas pattern.  No pneumoperitoneum. 3. Interval decreased ventilation at the left lung base, perhaps atelectasis. Electronically Signed   By: Genevie Ann M.D.   On: 06/26/2021  05:25   DG Abdomen Acute W/Chest  Result Date: 06/24/2021 CLINICAL DATA:  Abdominal pain and distension. EXAM: DG ABDOMEN ACUTE WITH 1 VIEW CHEST COMPARISON:  CT 01/28/2019 FINDINGS: Anti lordotic positioning. Low lung volumes. Borderline cardiomegaly with coronary stent. No acute airspace disease or pleural effusion. No pneumothorax. No free intra-abdominal air. Marked  gaseous gastric distension, similar gastric distension seen on prior exam when the stomach is fluid-filled. Large volume of colonic stool. There is also likely gaseous distension of transverse colon, although air-filled structure coursing across the mid abdomen may be distended stomach. No obvious small bowel dilatation. Prominent vascular calcifications are seen. The bones are diffusely under mineralized. No obvious acute osseous abnormality. Surgical clips in the right breast. IMPRESSION: 1. Marked gaseous gastric distension. The stomach was previously prominently distended with fluid. Air-filled structure coursing transversely across the mid abdomen may be gaseous distension of transverse colon or gastric distension. 2. Large volume of colonic stool. 3. Low lung volumes without acute abnormality in the chest. Electronically Signed   By: Keith Rake M.D.   On: 06/24/2021 17:59     CBC Recent Labs  Lab 06/24/21 1711 06/25/21 0455 06/26/21 0554  WBC 11.1* 10.4 8.1  HGB 14.1 12.2 11.6*  HCT 40.3 33.8* 33.6*  PLT 198 170 156  MCV 96.0 94.7 97.7  MCH 33.6 34.2* 33.7  MCHC 35.0 36.1* 34.5  RDW 13.3 13.2 13.7  LYMPHSABS 1.0 0.8  --   MONOABS 0.6 0.7  --   EOSABS 0.1 0.0  --   BASOSABS 0.0 0.0  --     Chemistries  Recent Labs  Lab 06/24/21 1711 06/25/21 0455 06/26/21 0554  NA 124* 128* 129*  K 4.1 3.0* 3.7  CL 85* 97* 102  CO2 27 23 21*  GLUCOSE 188* 100* 164*  BUN 27* 32* 18  CREATININE 1.25* 0.85 0.74  CALCIUM 8.6* 8.0* 7.8*  MG  --  1.7  --   AST '28 23 23  '$ ALT '19 16 14  '$ ALKPHOS 110 89 78  BILITOT 1.0 1.1  0.6   ------------------------------------------------------------------------------------------------------------------ No results for input(s): CHOL, HDL, LDLCALC, TRIG, CHOLHDL, LDLDIRECT in the last 72 hours.  Lab Results  Component Value Date   HGBA1C 5.0 11/25/2013   ------------------------------------------------------------------------------------------------------------------ No results for input(s): TSH, T4TOTAL, T3FREE, THYROIDAB in the last 72 hours.  Invalid input(s): FREET3 ------------------------------------------------------------------------------------------------------------------ No results for input(s): VITAMINB12, FOLATE, FERRITIN, TIBC, IRON, RETICCTPCT in the last 72 hours.  Coagulation profile Recent Labs  Lab 06/24/21 1832 06/25/21 0455  INR 1.2 1.2    No results for input(s): DDIMER in the last 72 hours.  Cardiac Enzymes No results for input(s): CKMB, TROPONINI, MYOGLOBIN in the last 168 hours.  Invalid input(s): CK ------------------------------------------------------------------------------------------------------------------ No results found for: BNP   Roxan Hockey M.D on 06/27/2021 at 7:26 PM  Go to [email or web address] - for contact info  [facility] - Office  [phone]

## 2021-06-28 ENCOUNTER — Encounter (HOSPITAL_COMMUNITY)
Admission: EM | Disposition: A | Discharge status: Hospice | Payer: Self-pay | Source: Home / Self Care | Attending: Family Medicine

## 2021-06-28 ENCOUNTER — Other Ambulatory Visit: Payer: Self-pay

## 2021-06-28 ENCOUNTER — Inpatient Hospital Stay (HOSPITAL_COMMUNITY): Payer: PPO | Admitting: Anesthesiology

## 2021-06-28 DIAGNOSIS — K920 Hematemesis: Secondary | ICD-10-CM

## 2021-06-28 DIAGNOSIS — K259 Gastric ulcer, unspecified as acute or chronic, without hemorrhage or perforation: Principal | ICD-10-CM

## 2021-06-28 DIAGNOSIS — D125 Benign neoplasm of sigmoid colon: Secondary | ICD-10-CM

## 2021-06-28 DIAGNOSIS — D490 Neoplasm of unspecified behavior of digestive system: Secondary | ICD-10-CM

## 2021-06-28 DIAGNOSIS — R933 Abnormal findings on diagnostic imaging of other parts of digestive tract: Secondary | ICD-10-CM

## 2021-06-28 DIAGNOSIS — T18198A Other foreign object in esophagus causing other injury, initial encounter: Secondary | ICD-10-CM

## 2021-06-28 HISTORY — PX: BIOPSY: SHX5522

## 2021-06-28 HISTORY — PX: FLEXIBLE SIGMOIDOSCOPY: SHX5431

## 2021-06-28 HISTORY — PX: ESOPHAGOGASTRODUODENOSCOPY (EGD) WITH PROPOFOL: SHX5813

## 2021-06-28 LAB — BASIC METABOLIC PANEL
Anion gap: 5 (ref 5–15)
BUN: 11 mg/dL (ref 8–23)
CO2: 22 mmol/L (ref 22–32)
Calcium: 7.8 mg/dL — ABNORMAL LOW (ref 8.9–10.3)
Chloride: 106 mmol/L (ref 98–111)
Creatinine, Ser: 0.74 mg/dL (ref 0.44–1.00)
GFR, Estimated: >60 mL/min (ref >60)
Glucose, Bld: 131 mg/dL — ABNORMAL HIGH (ref 70–99)
Potassium: 3.7 mmol/L (ref 3.5–5.1)
Sodium: 133 mmol/L — ABNORMAL LOW (ref 135–145)

## 2021-06-28 LAB — GLUCOSE, CAPILLARY
Glucose-Capillary: 134 mg/dL — ABNORMAL HIGH (ref 70–99)
Glucose-Capillary: 150 mg/dL — ABNORMAL HIGH (ref 70–99)
Glucose-Capillary: 142 mg/dL — ABNORMAL HIGH (ref 70–99)

## 2021-06-28 LAB — CBC
HCT: 31.7 % — ABNORMAL LOW (ref 36.0–46.0)
Hemoglobin: 11.3 g/dL — ABNORMAL LOW (ref 12.0–15.0)
MCH: 34.3 pg — ABNORMAL HIGH (ref 26.0–34.0)
MCHC: 35.6 g/dL (ref 30.0–36.0)
MCV: 96.4 fL (ref 80.0–100.0)
Platelets: 154 10*3/uL (ref 150–400)
RBC: 3.29 MIL/uL — ABNORMAL LOW (ref 3.87–5.11)
RDW: 13.8 % (ref 11.5–15.5)
WBC: 6 10*3/uL (ref 4.0–10.5)
nRBC: 0 % (ref 0.0–0.2)

## 2021-06-28 LAB — OCCULT BLOOD GASTRIC / DUODENUM (SPECIMEN CUP): Occult Blood, Gastric: POSITIVE — AB

## 2021-06-28 LAB — HEMOGLOBIN AND HEMATOCRIT, BLOOD
HCT: 35.2 % — ABNORMAL LOW (ref 36.0–46.0)
Hemoglobin: 12.1 g/dL (ref 12.0–15.0)

## 2021-06-28 SURGERY — ESOPHAGOGASTRODUODENOSCOPY (EGD) WITH PROPOFOL
Anesthesia: General

## 2021-06-28 MED ORDER — SODIUM CHLORIDE 0.9 % IV SOLN: INTRAVENOUS | Status: DC

## 2021-06-28 MED ORDER — STERILE WATER FOR IRRIGATION IR SOLN
Status: DC | PRN
  Administered 2021-06-28: 200 mL

## 2021-06-28 MED ORDER — KCL IN DEXTROSE-NACL 20-5-0.9 MEQ/L-%-% IV SOLN
INTRAVENOUS | Status: DC
  Administered 2021-06-30 – 2021-07-01 (×2): 75 mL/h via INTRAVENOUS

## 2021-06-28 MED ORDER — PROPOFOL 10 MG/ML IV BOLUS
INTRAVENOUS | Status: DC | PRN
  Administered 2021-06-28: 60 mg via INTRAVENOUS
  Administered 2021-06-28: 20 mg via INTRAVENOUS

## 2021-06-28 MED ORDER — GLYCOPYRROLATE 0.2 MG/ML IJ SOLN
0.1000 mg | Freq: Once | INTRAMUSCULAR | Status: AC
  Administered 2021-06-28: 0.05 mg via INTRAVENOUS

## 2021-06-28 MED ORDER — DEXAMETHASONE SODIUM PHOSPHATE 4 MG/ML IJ SOLN
4.0000 mg | Freq: Once | INTRAMUSCULAR | Status: AC
  Administered 2021-06-28: 4 mg via INTRAVENOUS

## 2021-06-28 MED ORDER — GLYCOPYRROLATE 0.2 MG/ML IJ SOLN
INTRAMUSCULAR | Status: AC
  Filled 2021-06-28: qty 1

## 2021-06-28 MED ORDER — EPHEDRINE SULFATE 50 MG/ML IJ SOLN
INTRAMUSCULAR | Status: DC | PRN
  Administered 2021-06-28: 10 mg via INTRAVENOUS

## 2021-06-28 MED ORDER — METOPROLOL TARTRATE 5 MG/5ML IV SOLN
2.0000 mg | INTRAVENOUS | Status: DC | PRN
Start: 2021-06-28 — End: 2021-06-28
  Administered 2021-06-28: 2 mg via INTRAVENOUS

## 2021-06-28 MED ORDER — DEXAMETHASONE SODIUM PHOSPHATE 4 MG/ML IJ SOLN
INTRAMUSCULAR | Status: AC
  Filled 2021-06-28: qty 1

## 2021-06-28 MED ORDER — LACTATED RINGERS IV SOLN: INTRAVENOUS | Status: DC

## 2021-06-28 MED ORDER — SENNOSIDES-DOCUSATE SODIUM 8.6-50 MG PO TABS
2.0000 | ORAL_TABLET | Freq: Two times a day (BID) | ORAL | Status: DC
  Administered 2021-06-28 – 2021-07-06 (×14): 2 via ORAL
  Filled 2021-06-28 (×15): qty 2

## 2021-06-28 MED ORDER — METOPROLOL TARTRATE 5 MG/5ML IV SOLN
INTRAVENOUS | Status: AC
  Filled 2021-06-28: qty 5

## 2021-06-28 MED ORDER — POLYETHYLENE GLYCOL 3350 17 G PO PACK
17.0000 g | PACK | Freq: Two times a day (BID) | ORAL | Status: DC
  Administered 2021-06-28 – 2021-06-30 (×4): 17 g via ORAL
  Filled 2021-06-28 (×4): qty 1

## 2021-06-28 MED ORDER — PHENYLEPHRINE HCL (PRESSORS) 10 MG/ML IV SOLN
INTRAVENOUS | Status: DC | PRN
  Administered 2021-06-28 (×2): 100 ug via INTRAVENOUS

## 2021-06-28 MED ORDER — SUCCINYLCHOLINE CHLORIDE 200 MG/10ML IV SOSY
PREFILLED_SYRINGE | INTRAVENOUS | Status: DC | PRN
  Administered 2021-06-28: 120 mg via INTRAVENOUS

## 2021-06-28 MED ORDER — LIDOCAINE HCL (CARDIAC) PF 100 MG/5ML IV SOSY
PREFILLED_SYRINGE | INTRAVENOUS | Status: DC | PRN
  Administered 2021-06-28: 60 mg via INTRATRACHEAL

## 2021-06-28 MED ORDER — SODIUM CHLORIDE FLUSH 0.9 % IV SOLN
INTRAVENOUS | Status: AC
  Filled 2021-06-28: qty 10

## 2021-06-28 NOTE — Plan of Care (Signed)
  Problem: Education: Goal: Knowledge of General Education information will improve Description: Including pain rating scale, medication(s)/side effects and non-pharmacologic comfort measures Outcome: Progressing   Problem: Health Behavior/Discharge Planning: Goal: Ability to manage health-related needs will improve Outcome: Progressing   Problem: Clinical Measurements: Goal: Ability to maintain clinical measurements within normal limits will improve Outcome: Progressing Goal: Will remain free from infection Outcome: Progressing Goal: Diagnostic test results will improve Outcome: Progressing Goal: Respiratory complications will improve Outcome: Progressing Goal: Cardiovascular complication will be avoided Outcome: Progressing   Problem: Activity: Goal: Risk for activity intolerance will decrease Outcome: Not Progressing   Problem: Nutrition: Goal: Adequate nutrition will be maintained Outcome: Not Progressing   Problem: Coping: Goal: Level of anxiety will decrease Outcome: Progressing   Problem: Elimination: Goal: Will not experience complications related to bowel motility Outcome: Not Progressing Goal: Will not experience complications related to urinary retention Outcome: Progressing   Problem: Pain Managment: Goal: General experience of comfort will improve Outcome: Progressing   Problem: Safety: Goal: Ability to remain free from injury will improve Outcome: Progressing   Problem: Skin Integrity: Goal: Risk for impaired skin integrity will decrease Outcome: Progressing

## 2021-06-28 NOTE — Discharge Summary (Signed)
Patient Demographics:    Debra Doyle, is a 87 y.o. female, DOB - 13-Jan-1929, [id number]  Admit date - 06/24/2021   Admitting Physician Tamala Manzer Denton Brick, MD  Outpatient Primary MD for the patient is [name], MD  LOS - 4   Chief Complaint  Patient presents with   Abdominal Pain        Subjective:    [name] today has no fevers, no emesis,  No chest pain,   -Sleepy after EGD and flex sig  Assessment  & Plan :    Principal Problem:   Gastric distention Active Problems:   AKI (acute kidney injury) ([place])   Hyponatremia   Mild protein-calorie malnutrition (HCC)   Hypotension   Brief Summary:- 87 y.o. female, with history of coronary artery disease/Prior MI/ischemic cardiomyopathy and HTN--admitted on 06/25/2021 with abdominal pain nausea and vomiting and found to have significant gastric distention on CT abdomen and pelvis  -   A/p 1)Abdominal pain with Nausea and Vomiting--- significant gastric distention on CT abdomen and Pelvis -Continue NG tube----(NG tube replaced on 06/26/21) -c/n IV fluids and PPI -GI consult appreciated -EGD and colonoscopy onEGD and flex sig- -- EGD: Nonbleeding gastric ulcer, --Flex sig shows lower: anal mass 5 cm from anal verge    2)-Short segment area of Narrowing S/p EGD and flex sig on 8/16/2022g at the Rectosigmoid junction with a moderately dilated distal sigmoid colon--- rectal stool ball noted on CT abdomen and pelvis as well  -- Cannot rule out  sigmoid stricture/malignancy----- -GI consult appreciated  Flex sig shows lower: anal mass 5 cm from anal verge --- biopsy/pathology pending -Continue laxatives    3)Dehydration/AKI/HypoMagnesiemia/HypoKalemia/HypoNatremia--- in the setting of poor oral intake and nausea vomiting----continue IV fluids Na 124 >> 129>>133   4)Transiet Hypotension--suspect due to dehydration and AKI as noted above in #3  above, rather than frank sepsis AM Cortisol is 26.2  --WBC 11.1 >> 10.4>> 8.1>>6.0 PCT is 0.23 Lactic acid 2.8 >>1.5 (lactic acidosis may be due to dehydration and poor tissue perfusion) -Discontinued vancomycin, cefepime and Flagyl   Disposition/Need for in-Hospital Stay- patient unable to be discharged at this time due to -requiring IV fluids until able to tolerate oral intake  Status is: Inpatient  Remains inpatient appropriate because: Please see disposition above  Disposition: The patient is from: Home              Anticipated d/c is to: Home with HH Vs SNF              Anticipated d/c date is: 2 days              Patient currently is not medically stable to d/c. Barriers: Not Clinically Stable-   Code Status :  -  Code Status: DNR   Family Communication:   (patient is alert, awake and coherent)  Son [name] at [phone] Consults  :  Gi  DVT Prophylaxis  :   - SCDs   Place and maintain sequential compression device Start: 06/27/21 2139 SCDs Start: 06/24/21 2330    Lab Results  Component Value Date   PLT 154 06/28/2021    Inpatient Medications  Scheduled Meds:  lidocaine  1 patch Transdermal Q24H   pantoprazole (PROTONIX) IV  40 mg Intravenous Q12H   polyethylene glycol  17 g Oral BID   senna-docusate  2 tablet Oral BID   sodium phosphate  1 enema Rectal Once   Continuous Infusions:  dextrose 5 % and 0.9 % NaCl with KCl 20 mEq/L 75 mL/hr at 06/28/21 1655   PRN Meds:.acetaminophen **OR** acetaminophen, benzonatate, morphine injection, ondansetron **OR** ondansetron (ZOFRAN) IV, phenol    Anti-infectives (From admission, onward)    Start     Dose/Rate Route Frequency Ordered Stop   06/25/21 1800  ceFEPIme (MAXIPIME) 2 g in sodium chloride 0.9 % 100 mL IVPB  Status:  Discontinued        2 g 200 mL/hr over 30 Minutes Intravenous Every 24 hours 06/24/21 1851 06/27/21 1255   06/25/21 1800  vancomycin (VANCOREADY) IVPB 500 mg/100 mL        500 mg 100  mL/hr over 60 Minutes Intravenous Every 24 hours 06/24/21 1856 06/25/21 2359   06/24/21 1830  ceFEPIme (MAXIPIME) 2 g in sodium chloride 0.9 % 100 mL IVPB        2 g 200 mL/hr over 30 Minutes Intravenous  Once 06/24/21 1829 06/24/21 2118   06/24/21 1830  metroNIDAZOLE (FLAGYL) IVPB 500 mg  Status:  Discontinued        500 mg 100 mL/hr over 60 Minutes Intravenous Every 8 hours 06/24/21 1829 06/27/21 1255   06/24/21 1830  vancomycin (VANCOCIN) IVPB 1000 mg/200 mL premix        1,000 mg 200 mL/hr over 60 Minutes Intravenous  Once 06/24/21 1829 06/24/21 2118         Objective:   Vitals:   06/28/21 1515 06/28/21 1530 06/28/21 1545 06/28/21 1745  BP: (!) 153/93 (!) 139/98 119/90   Pulse: 90 99 79 94  Resp: (!) 24 (!) 25 (!) 30 (!) 24  Temp:    (!) 97.5 F (36.4 C)  TempSrc:    Oral  SpO2: 97% 96% 97% 97%  Weight:      Height:        Wt Readings from Last 3 Encounters:  06/28/21 51.7 kg  03/31/21 51.7 kg  01/29/20 56.7 kg     Intake/Output Summary (Last 24 hours) at 06/28/2021 1914 Last data filed at 06/28/2021 1700 Gross per 24 hour  Intake 321.41 ml  Output 650 ml  Net -328.59 ml     Physical Exam  Gen:- Awake Alert, in no acute distress HEENT:- [place].AT, No sclera icterus Neck-Supple Neck,No JVD,.  Lungs-diminished breath sounds, no wheezing  CV- S1, S2 normal, regular = Abd-diminished bowel sounds, significant abdominal distention, generalized tenderness no rebound  extremity/Skin:- No  edema, pedal pulses present  Psych-affect is appropriate, episodes of disorientation and confusion at times, underlying memory and cognitive deficits Neuro-generalized weakness, no new focal deficits, no tremors   Data Review:   Micro Results Recent Results (from the past 240 hour(s))  Blood Culture (routine x 2)     Status: None (Preliminary result)   Collection Time: 06/24/21  6:35 PM   Specimen: Right Antecubital; Blood  Result Value Ref Range Status   Specimen Description  RIGHT ANTECUBITAL  Final   Special Requests   Final    BOTTLES DRAWN AEROBIC AND ANAEROBIC Blood Culture adequate volume   Culture   Final    NO GROWTH 4 DAYS Performed at [facility], [address] [place], [place] [address]    Report Status PENDING  Incomplete  Blood Culture (routine x 2)  Status: None (Preliminary result)   Collection Time: 06/24/21  6:42 PM   Specimen: Left Antecubital; Blood  Result Value Ref Range Status   Specimen Description LEFT ANTECUBITAL  Final   Special Requests   Final    BOTTLES DRAWN AEROBIC AND ANAEROBIC Blood Culture adequate volume   Culture   Final    NO GROWTH 4 DAYS Performed at [facility], [address] [place], [place] [address]    Report Status PENDING  Incomplete  SARS CORONAVIRUS 2 (TAT 6-24 HRS) Nasopharyngeal Nasopharyngeal Swab     Status: None   Collection Time: 06/25/21  1:52 AM   Specimen: Nasopharyngeal Swab  Result Value Ref Range Status   SARS Coronavirus 2 NEGATIVE NEGATIVE Final    Comment: (NOTE) SARS-CoV-2 target nucleic acids are NOT DETECTED.  The SARS-CoV-2 RNA is generally detectable in upper and lower respiratory specimens during the acute phase of infection. Negative results do not preclude SARS-CoV-2 infection, do not rule out co-infections with other pathogens, and should not be used as the sole basis for treatment or other patient management decisions. Negative results must be combined with clinical observations, patient history, and epidemiological information. The expected result is Negative.  Fact Sheet for Patients: SugarRoll.be  Fact Sheet for Healthcare Providers: https://www.woods-mathews.com/  This test is not yet approved or cleared by the [place] FDA and  has been authorized for detection and/or diagnosis of SARS-CoV-2 by FDA under an Emergency Use Authorization (EUA). This EUA will remain  in effect (meaning this test can be used) for the  duration of the COVID-19 declaration under Se ction 564(b)(1) of the Act, 21 U.S.C. section 360bbb-3(b)(1), unless the authorization is terminated or revoked sooner.  Performed at [facility], [place] [address] [place], [place] [address]     Radiology Reports CT ABDOMEN PELVIS W CONTRAST  Result Date: 06/24/2021 CLINICAL DATA:  Abdominal distension EXAM: CT ABDOMEN AND PELVIS WITH CONTRAST TECHNIQUE: Multidetector CT imaging of the abdomen and pelvis was performed using the standard protocol following bolus administration of intravenous contrast. CONTRAST:  17m OMNIPAQUE IOHEXOL 350 MG/ML SOLN COMPARISON:  CT 01/28/2019 FINDINGS: Lower chest: Bibasilar atelectasis. Heart size is mildly enlarged. Coronary artery calcifications are seen. Hepatobiliary: No focal liver abnormality is seen. No gallstones, gallbladder wall thickening, or biliary dilatation. Pancreas: Mildly atrophic, but appears grossly unremarkable. Spleen: Normal in size without focal abnormality. Adrenals/Urinary Tract: Unremarkable adrenal glands. Kidneys enhance symmetrically without focal lesion, stone, or hydronephrosis. Ureters are nondilated. Urinary bladder appears unremarkable. Stomach/Bowel: Markedly dilated stomach with fluid and air. No evidence of mechanical outlet obstruction. There is a moderate-sized rectal stool ball. Short segment area of narrowing at the rectosigmoid junction with a moderately dilated distal sigmoid colon. Moderate volume of stool throughout the remaining colon. What appears to be a normal appendix is seen in the right lower quadrant (series 4, image 51). No dilated loops of small bowel are seen within the abdomen. Vascular/Lymphatic: Atherosclerotic calcifications are present throughout the aortoiliac axis. No abdominopelvic lymphadenopathy. Reproductive: Uterus and bilateral adnexa are unremarkable. Other: No free fluid. No abdominopelvic fluid collection. No pneumoperitoneum. No abdominal wall  hernia. Musculoskeletal: Diffuse osseous demineralization. Vertebra plana deformity of the T10 vertebral body, progressed from 2020. Mild chronic L2 compression deformity. IMPRESSION: 1. Markedly dilated stomach with fluid and air. No evidence of mechanical outlet obstruction. 2. Short segment area of narrowing at the rectosigmoid junction with a moderately dilated distal sigmoid colon. Findings may represent a focal area of peristalsis, although  a colonic mass is not entirely excluded. A follow-up colonoscopy could be considered. 3. Moderate-sized rectal stool ball. 4. Vertebra plana deformity of the T10 vertebral body, progressed from 2020. Mild chronic L2 compression deformity. 5. Aortic atherosclerosis (ICD10-I70.0). Electronically Signed   By: Davina Poke D.O.   On: 06/24/2021 20:07   DG CHEST PORT 1 VIEW  Result Date: 06/26/2021 CLINICAL DATA:  NG tube placement EXAM: PORTABLE CHEST 1 VIEW COMPARISON:  None. FINDINGS: NG tube with tip in the gastric body and side port below the GE junction. Low lung volumes.  Cardiac stent noted. IMPRESSION: NG tube with tip in stomach. Electronically Signed   By: Suzy Bouchard M.D.   On: 06/26/2021 09:17   DG CHEST PORT 1 VIEW  Result Date: 06/25/2021 CLINICAL DATA:  Cough, NG tube placement EXAM: PORTABLE CHEST 1 VIEW COMPARISON:  01/29/2019 FINDINGS: Lungs are essentially clear.  No pleural effusion or pneumothorax. The heart is normal in size.  Coronary stent. Enteric tube courses into the mid stomach. IMPRESSION: Enteric tube courses into the mid stomach. Electronically Signed   By: Julian Hy M.D.   On: 06/25/2021 02:44   DG ABD ACUTE 2+V W 1V CHEST  Result Date: 06/27/2021 CLINICAL DATA:  Abdominal pain and gastric distension. Patient with severe pain throughout. Hx of HTN and MI. Negative covid-19 test x 3 days ago. EXAM: DG ABDOMEN ACUTE WITH 1 VIEW CHEST COMPARISON:  the previous day's study FINDINGS: Low lung volumes with patchy  subsegmental atelectasis at the lung bases. Heart size upper limits normal for technique. Aortic Atherosclerosis (ICD10-170.0). Coronary stent. Blunting of the left lateral costophrenic angle suggesting small effusion. No pneumothorax. No free air. Normal bowel gas pattern. Nasogastric tube extends into the decompressed stomach. Urinary bladder is distended. Regional bones unremarkable. IMPRESSION: 1. Distended urinary bladder. 2. Low volumes with patchy subsegmental atelectasis at the lung bases, possible small left effusion. 3. For nasogastric tube to the decompressed stomach. Electronically Signed   By: Lucrezia Europe M.D.   On: 06/27/2021 07:29   DG ABD ACUTE 2+V W 1V CHEST  Result Date: 06/26/2021 CLINICAL DATA:  87 year old female with abdominal pain. EXAM: DG ABDOMEN ACUTE WITH 1 VIEW CHEST COMPARISON:  CT Abdomen and Pelvis 06/24/2021 and earlier. FINDINGS: Portable upright and supine views at 0442 hours. Enteric tube has been placed into the stomach, with evidence of resolved gastric distension since the recent CT. Mildly distended urinary bladder with excreted IV contrast. Continued low lung volumes. No pneumothorax or pneumoperitoneum identified. Increased hypo ventilation at the left lung base. Stable cardiac size and mediastinal contours. Gas-filled bowel loops elsewhere in the abdomen and pelvis appear stable from the recent CT. Osteopenia. No acute osseous abnormality identified. IMPRESSION: 1. Enteric tube placed into the stomach with evidence of gastric decompression since the recent CT. 2. Otherwise stable bowel gas pattern.  No pneumoperitoneum. 3. Interval decreased ventilation at the left lung base, perhaps atelectasis. Electronically Signed   By: Genevie Ann M.D.   On: 06/26/2021 05:25   DG Abdomen Acute W/Chest  Result Date: 06/24/2021 CLINICAL DATA:  Abdominal pain and distension. EXAM: DG ABDOMEN ACUTE WITH 1 VIEW CHEST COMPARISON:  CT 01/28/2019 FINDINGS: Anti lordotic positioning. Low lung  volumes. Borderline cardiomegaly with coronary stent. No acute airspace disease or pleural effusion. No pneumothorax. No free intra-abdominal air. Marked gaseous gastric distension, similar gastric distension seen on prior exam when the stomach is fluid-filled. Large volume of colonic stool. There is also likely gaseous distension of  transverse colon, although air-filled structure coursing across the mid abdomen may be distended stomach. No obvious small bowel dilatation. Prominent vascular calcifications are seen. The bones are diffusely under mineralized. No obvious acute osseous abnormality. Surgical clips in the right breast. IMPRESSION: 1. Marked gaseous gastric distension. The stomach was previously prominently distended with fluid. Air-filled structure coursing transversely across the mid abdomen may be gaseous distension of transverse colon or gastric distension. 2. Large volume of colonic stool. 3. Low lung volumes without acute abnormality in the chest. Electronically Signed   By: Keith Rake M.D.   On: 06/24/2021 17:59     CBC Recent Labs  Lab 06/24/21 1711 06/25/21 0455 06/26/21 0554 06/27/21 2208 06/28/21 0329 06/28/21 1151  WBC 11.1* 10.4 8.1  --  6.0  --   HGB 14.1 12.2 11.6* 12.4 11.3* 12.1  HCT 40.3 33.8* 33.6* 34.8* 31.7* 35.2*  PLT 198 170 156  --  154  --   MCV 96.0 94.7 97.7  --  96.4  --   MCH 33.6 34.2* 33.7  --  34.3*  --   MCHC 35.0 36.1* 34.5  --  35.6  --   RDW 13.3 13.2 13.7  --  13.8  --   LYMPHSABS 1.0 0.8  --   --   --   --   MONOABS 0.6 0.7  --   --   --   --   EOSABS 0.1 0.0  --   --   --   --   BASOSABS 0.0 0.0  --   --   --   --     Chemistries  Recent Labs  Lab 06/24/21 1711 06/25/21 0455 06/26/21 0554 06/28/21 0329  NA 124* 128* 129* 133*  K 4.1 3.0* 3.7 3.7  CL 85* 97* 102 106  CO2 27 23 21* 22  GLUCOSE 188* 100* 164* 131*  BUN 27* 32* 18 11  CREATININE 1.25* 0.85 0.74 0.74  CALCIUM 8.6* 8.0* 7.8* 7.8*  MG  --  1.7  --   --   AST '28  23 23  '$ --   ALT '19 16 14  '$ --   ALKPHOS 110 89 78  --   BILITOT 1.0 1.1 0.6  --    ------------------------------------------------------------------------------------------------------------------ No results for input(s): CHOL, HDL, LDLCALC, TRIG, CHOLHDL, LDLDIRECT in the last 72 hours.  Lab Results  Component Value Date   HGBA1C 5.0 11/25/2013   ------------------------------------------------------------------------------------------------------------------ No results for input(s): TSH, T4TOTAL, T3FREE, THYROIDAB in the last 72 hours.  Invalid input(s): FREET3 ------------------------------------------------------------------------------------------------------------------ No results for input(s): VITAMINB12, FOLATE, FERRITIN, TIBC, IRON, RETICCTPCT in the last 72 hours.  Coagulation profile Recent Labs  Lab 06/24/21 1832 06/25/21 0455  INR 1.2 1.2    No results for input(s): DDIMER in the last 72 hours.  Cardiac Enzymes No results for input(s): CKMB, TROPONINI, MYOGLOBIN in the last 168 hours.  Invalid input(s): CK ------------------------------------------------------------------------------------------------------------------ No results found for: BNP   Roxan Hockey M.D on 06/28/2021 at 7:14 PM  Go to [email or web address] - for contact info  [facility] - Office  [phone]

## 2021-06-28 NOTE — Anesthesia Procedure Notes (Signed)
Procedure Name: Intubation Date/Time: 06/28/2021 1:31 PM Performed by: Tacy Learn, CRNA Pre-anesthesia Checklist: Patient identified, Emergency Drugs available, Suction available, Patient being monitored and Timeout performed Patient Re-evaluated:Patient Re-evaluated prior to induction Oxygen Delivery Method: Circle system utilized Induction Type: IV induction Laryngoscope Size: Miller and 2 Grade View: Grade III Tube type: Oral Tube size: 7.5 mm Number of attempts: 1 Airway Equipment and Method: Stylet Placement Confirmation: ETT inserted through vocal cords under direct vision, positive ETCO2, CO2 detector and breath sounds checked- equal and bilateral Secured at: 21 cm Tube secured with: Tape Dental Injury: Teeth and Oropharynx as per pre-operative assessment

## 2021-06-28 NOTE — Transfer of Care (Signed)
Immediate Anesthesia Transfer of Care Note  Patient: Debra Doyle  Procedure(s) Performed: ESOPHAGOGASTRODUODENOSCOPY (EGD) WITH PROPOFOL FLEXIBLE SIGMOIDOSCOPY BIOPSY  Patient Location: Short Stay  Anesthesia Type:General  Level of Consciousness: awake, drowsy, patient cooperative and responds to stimulation  Airway & Oxygen Therapy: Patient Spontanous Breathing and Patient connected to face mask oxygen  Post-op Assessment: Report given to RN, Post -op Vital signs reviewed and stable and Patient moving all extremities X 4  Post vital signs: Reviewed and stable  Last Vitals:  Vitals Value Taken Time  BP 120/81 06/28/21 1423  Temp    Pulse 89 06/28/21 1424  Resp 17 06/28/21 1424  SpO2 100 % 06/28/21 1424  Vitals shown include unvalidated device data.  Last Pain:  Vitals:   06/28/21 1333  TempSrc:   PainSc: 10-Worst pain ever         Complications: No notable events documented.

## 2021-06-28 NOTE — Op Note (Addendum)
[facility] Patient Name: Debra Doyle Procedure Date: 06/28/2021 1:14 PM MRN: [id number] Date of Birth: 20-Jul-1929 Attending MD: Maylon Peppers ,  CSN: [id number] Age: 87 Admit Type: Inpatient Procedure:                Upper GI endoscopy Indications:              Coffee-ground emesis, Abnormal CT of the GI tract,                            Abdominal distention Providers:                Maylon Peppers, Lambert Mody, Charlsie Quest.                            Insurance claims handler, Therapist, sports, Suzan Garibaldi. Risa Grill, Technician Referring MD:              Medicines:                Monitored Anesthesia Care Complications:            No immediate complications. Estimated Blood Loss:     Estimated blood loss: none. Procedure:                Pre-Anesthesia Assessment:                           - Prior to the procedure, a History and Physical                            was performed, and patient medications, allergies                            and sensitivities were reviewed. The patient's                            tolerance of previous anesthesia was reviewed.                           - The risks and benefits of the procedure and the                            sedation options and risks were discussed with the                            patient. All questions were answered and informed                            consent was obtained.                           - ASA Grade Assessment: III - A patient with severe                            systemic disease.                           After obtaining informed consent,  the endoscope was                            passed under direct vision. Throughout the                            procedure, the patient's blood pressure, pulse, and                            oxygen saturations were monitored continuously. The                            [id number] scope was introduced through the                            mouth, and advanced to the second part of  duodenum.                            The upper GI endoscopy was accomplished without                            difficulty. The patient tolerated the procedure                            well. Scope In: 1:34:02 PM Scope Out: 1:44:56 PM Total Procedure Duration: 0 hours 10 minutes 54 seconds  Findings:      A nasogastric tube was found in the entire esophagus, extending to the       gastric body. The esophagus was otherwise normal.      One non-bleeding gastric ulcer was found in the cardia. The lesion was       15 mm in largest dimension. the ulcer appearead to be healing on with a       clean base but there were 3 area of purplish discoloration, not       classical for vessles but these were not biopsied. Biopsies were taken       with a cold forceps for histology.      The examined duodenum was normal. Impression:               - A gastric tube was found in the esophagus.                           - Non-bleeding gastric ulcer. Biopsied.                           - Normal examined duodenum. Moderate Sedation:      Per Anesthesia Care Recommendation:           - Discharge patient to home (ambulatory).                           - Resume previous diet.                           - Await pathology results.                           -  Continue Protonix 40 mg twice a day. Procedure Code(s):        --- Professional ---                           [phone], Esophagogastroduodenoscopy, flexible,                            transoral; with biopsy, single or multiple Diagnosis Code(s):        --- Professional ---                           [phone], Other foreign object in esophagus causing                            other injury, initial encounter                           K25.9, Gastric ulcer, unspecified as acute or                            chronic, without hemorrhage or perforation                           K92.0, Hematemesis                           R14.0, Abdominal distension (gaseous)                            R93.3, Abnormal findings on diagnostic imaging of                            other parts of digestive tract CPT copyright 2019 [employer]. All rights reserved. The codes documented in this report are preliminary and upon coder review may  be revised to meet current compliance requirements. Maylon Peppers, MD Maylon Peppers,  06/28/2021 2:23:05 PM This report has been signed electronically. Number of Addenda: 0

## 2021-06-28 NOTE — Anesthesia Postprocedure Evaluation (Signed)
Anesthesia Post Note  Patient: Debra Doyle  Procedure(s) Performed: ESOPHAGOGASTRODUODENOSCOPY (EGD) WITH PROPOFOL FLEXIBLE SIGMOIDOSCOPY BIOPSY  Patient location during evaluation: PACU Anesthesia Type: General Level of consciousness: awake and alert and oriented Pain management: pain level controlled Vital Signs Assessment: post-procedure vital signs reviewed and stable Respiratory status: spontaneous breathing and respiratory function stable Cardiovascular status: blood pressure returned to baseline and stable Postop Assessment: no apparent nausea or vomiting Anesthetic complications: no   No notable events documented.   Last Vitals:  Vitals:   06/28/21 1530 06/28/21 1545  BP: (!) 139/98 119/90  Pulse: 99 79  Resp: (!) 25 (!) 30  Temp:    SpO2: 96% 97%    Last Pain:  Vitals:   06/28/21 1545  TempSrc:   PainSc: 0-No pain                 Jaimya Feliciano C Farran Amsden

## 2021-06-28 NOTE — Progress Notes (Signed)
We will proceed with EGD and flexible sigmoidoscopy as scheduled.  I thoroughly discussed with the patient his procedure, including the risks involved. Patient understands what the procedure involves including the benefits and any risks. Patient understands alternatives to the proposed procedure. Risks including (but not limited to) bleeding, tearing of the lining (perforation), rupture of adjacent organs, problems with heart and lung function, infection, and medication reactions. A small percentage of complications may require surgery, hospitalization, repeat endoscopic procedure, and/or transfusion.  Patient understood and agreed.  Maylon Peppers, MD Gastroenterology and Hepatology [facility]

## 2021-06-28 NOTE — Anesthesia Preprocedure Evaluation (Signed)
Anesthesia Evaluation  Patient identified by MRN, date of birth, ID band Patient awake    Reviewed: Allergy & Precautions, NPO status , Patient's Chart, lab work & pertinent test results, reviewed documented beta blocker date and time   History of Anesthesia Complications Negative for: history of anesthetic complications  Airway Mallampati: II  TM Distance: >3 FB Neck ROM: Full    Dental  (+) Dental Advisory Given, Missing   Pulmonary neg pulmonary ROS,    Pulmonary exam normal breath sounds clear to auscultation       Cardiovascular Exercise Tolerance: Poor hypertension, Pt. on medications and Pt. on home beta blockers + CAD, + Past MI and + Cardiac Stents  Normal cardiovascular exam Rhythm:Regular Rate:Normal     Neuro/Psych negative neurological ROS  negative psych ROS   GI/Hepatic PUD, GERD  Medicated,IMPRESSION: 1. Markedly dilated stomach with fluid and air. No evidence of mechanical outlet obstruction. 2. Short segment area of narrowing at the rectosigmoid junction with a moderately dilated distal sigmoid colon. Findings may represent a focal area of peristalsis, although a colonic mass is not entirely excluded. A follow-up colonoscopy could be considered. 3. Moderate-sized rectal stool ball. 4. Vertebra plana deformity of the T10 vertebral body, progressed from 2020. Mild chronic L2 compression deformity. 5. Aortic atherosclerosis (ICD10-I70.0).   Electronically Signed   By: Davina Poke D.O.   On: 06/24/2021 20:07    Endo/Other  negative endocrine ROS  Renal/GU Renal InsufficiencyRenal disease (AKI)     Musculoskeletal negative musculoskeletal ROS (+)   Abdominal   Peds  Hematology negative hematology ROS (+)   Anesthesia Other Findings IMPRESSION: 1. Markedly dilated stomach with fluid and air. No evidence of mechanical outlet obstruction. 2. Short segment area of narrowing at the  rectosigmoid junction with a moderately dilated distal sigmoid colon. Findings may represent a focal area of peristalsis, although a colonic mass is not entirely excluded. A follow-up colonoscopy could be considered. 3. Moderate-sized rectal stool ball. 4. Vertebra plana deformity of the T10 vertebral body, progressed from 2020. Mild chronic L2 compression deformity. 5. Aortic atherosclerosis (ICD10-I70.0).  Electronically Signed   By: Davina Poke D.O.   On: 06/24/2021 20:07   Reproductive/Obstetrics negative OB ROS                             Anesthesia Physical Anesthesia Plan  ASA: 4 and emergent  Anesthesia Plan: General   Post-op Pain Management:    Induction: Intravenous, Cricoid pressure planned and Rapid sequence  PONV Risk Score and Plan: 3 and Ondansetron and Dexamethasone  Airway Management Planned: Oral ETT  Additional Equipment:   Intra-op Plan:   Post-operative Plan: Extubation in OR and Possible Post-op intubation/ventilation  Informed Consent: I have reviewed the patients History and Physical, chart, labs and discussed the procedure including the risks, benefits and alternatives for the proposed anesthesia with the patient or authorized representative who has indicated his/her understanding and acceptance.    Discussed DNR with patient, Discussed DNR with power of attorney and Suspend DNR.   Dental advisory given  Plan Discussed with: CRNA and Surgeon  Anesthesia Plan Comments:         Anesthesia Quick Evaluation

## 2021-06-28 NOTE — Brief Op Note (Signed)
06/24/2021 - 06/28/2021  2:23 PM  PATIENT:  Debra Doyle  87 y.o. female  PRE-OPERATIVE DIAGNOSIS:  dilated stomach on imaging, sigmoid stricture  POST-OPERATIVE DIAGNOSIS:  EGD: gastric ulcer, Lower: anal mass 5 cm from anal verge (biopsies taken)  PROCEDURE:  Procedure(s): ESOPHAGOGASTRODUODENOSCOPY (EGD) WITH PROPOFOL (N/A) FLEXIBLE SIGMOIDOSCOPY (N/A) BIOPSY  SURGEON:  Surgeon(s) and Role:    * Harvel Quale, MD - Primary  Patient underwent EGD and flexible sigmoidoscopy under general anesthesia.  Tolerated the procedure adequately.  Bowel prep was poor.  Esophagogastroduodenospy showed presence of nasogastric tube in the esophagus extending to the stomach.  There was One non-bleeding gastric ulcer was found in the cardia.  The lesion was 15 mm in largest dimension. the ulcer appearead to be healing on with a clean base but there were 3 area of purplish discoloration, not classical for vessles but these were not biopsied.  Biopsies were taken with a cold forceps for histology.  The rest of the stomach was normal.  Duodenum was normal.  Flexible sigmoidoscopy showed presence of large amount of solid stool in the sigmoid area., making visualization difficult. I did not observe any masses up to 35 cm from the sigmoid but given her poor prep  it would be difficult to determine if there were any lesions in the sigmoid. The scope could not be advanced past this area as there was significant  amount of stool. A 8 mm polyp was found in the sigmoid colon.  The polyp was sessile. This could not be removed as there was significant amount of stool covering the polyp despite trying to clean it multiple times. A frond-like/villous and fungating non-obstructing large mass was found at 5 cm proximal to the anus. The lesion had two localized areas of depression. The mass was non-circumferential.  The mass measured one cm in length.  In addition, its diameter measured thirty mm.  No bleeding was  present.  This was biopsied with a cold forceps for histology.   RECOMMENDATIONS: - Resume previous diet.  - Start Miralax TID once tolerating oral medications. - Await pathology results.  - Return patient to hospital ward for ongoing care.  - Continue Protonix 40 mg twice a day. - May require a colonoscopy with full prep if patient desires to further evaluate the area of narrowing in rectosigmoid.  Maylon Peppers, MD Gastroenterology and Hepatology [facility]

## 2021-06-29 ENCOUNTER — Encounter (HOSPITAL_COMMUNITY): Payer: Self-pay | Admitting: Gastroenterology

## 2021-06-29 LAB — CULTURE, BLOOD (ROUTINE X 2)
Culture: NO GROWTH
Culture: NO GROWTH
Special Requests: ADEQUATE
Special Requests: ADEQUATE

## 2021-06-29 LAB — CBC
HCT: 33.6 % — ABNORMAL LOW (ref 36.0–46.0)
Hemoglobin: 11.5 g/dL — ABNORMAL LOW (ref 12.0–15.0)
MCH: 32.7 pg (ref 26.0–34.0)
MCHC: 34.2 g/dL (ref 30.0–36.0)
MCV: 95.5 fL (ref 80.0–100.0)
Platelets: 165 10*3/uL (ref 150–400)
RBC: 3.52 MIL/uL — ABNORMAL LOW (ref 3.87–5.11)
RDW: 13.9 % (ref 11.5–15.5)
WBC: 4.5 10*3/uL (ref 4.0–10.5)
nRBC: 0 % (ref 0.0–0.2)

## 2021-06-29 LAB — BASIC METABOLIC PANEL
Anion gap: 5 (ref 5–15)
BUN: 10 mg/dL (ref 8–23)
CO2: 23 mmol/L (ref 22–32)
Calcium: 8.3 mg/dL — ABNORMAL LOW (ref 8.9–10.3)
Chloride: 109 mmol/L (ref 98–111)
Creatinine, Ser: 0.65 mg/dL (ref 0.44–1.00)
GFR, Estimated: >60 mL/min (ref >60)
Glucose, Bld: 201 mg/dL — ABNORMAL HIGH (ref 70–99)
Potassium: 4.3 mmol/L (ref 3.5–5.1)
Sodium: 137 mmol/L (ref 135–145)

## 2021-06-29 LAB — GLUCOSE, CAPILLARY: Glucose-Capillary: 161 mg/dL — ABNORMAL HIGH (ref 70–99)

## 2021-06-29 MED ORDER — MILK AND MOLASSES ENEMA
1.0000 | Freq: Once | RECTAL | Status: AC
  Administered 2021-06-29: 240 mL via RECTAL

## 2021-06-29 MED ORDER — CARVEDILOL 3.125 MG PO TABS
6.2500 mg | ORAL_TABLET | Freq: Two times a day (BID) | ORAL | Status: DC
  Administered 2021-06-29 – 2021-06-30 (×3): 6.25 mg via ORAL
  Filled 2021-06-29 (×3): qty 2

## 2021-06-29 MED ORDER — HYDRALAZINE HCL 20 MG/ML IJ SOLN
10.0000 mg | INTRAMUSCULAR | Status: DC | PRN
  Administered 2021-07-04: 10 mg via INTRAVENOUS
  Filled 2021-06-29 (×2): qty 1

## 2021-06-29 MED ORDER — CARVEDILOL 3.125 MG PO TABS
6.2500 mg | ORAL_TABLET | Freq: Once | ORAL | Status: AC
  Administered 2021-06-29: 6.25 mg via ORAL
  Filled 2021-06-29: qty 2

## 2021-06-29 NOTE — Op Note (Signed)
[facility] Patient Name: Debra Doyle Procedure Date: 06/28/2021 1:46 PM MRN: [id number] Date of Birth: 1928/12/30 Attending MD: Maylon Peppers ,  CSN: [id number] Age: 87 Admit Type: Inpatient Procedure:                Flexible Sigmoidoscopy Indications:              Abnormal CT of the GI tract Providers:                Maylon Peppers, Lambert Mody, Kristine L.                            Risa Grill, Technician Referring MD:              Medicines:                General Anesthesia Complications:            No immediate complications. Estimated Blood Loss:     Estimated blood loss: none. Procedure:                Pre-Anesthesia Assessment:                           - Prior to the procedure, a History and Physical                            was performed, and patient medications, allergies                            and sensitivities were reviewed. The patient's                            tolerance of previous anesthesia was reviewed.                           - The risks and benefits of the procedure and the                            sedation options and risks were discussed with the                            patient. All questions were answered and informed                            consent was obtained.                           - ASA Grade Assessment: III - A patient with severe                            systemic disease.                           After obtaining informed consent, the scope was                            passed under direct vision. The [id number]  scope was introduced through the anus and advanced                            to the 35 cm from the anal verge. The flexible                            sigmoidoscopy was performed with difficulty due to                            poor bowel prep. The patient tolerated the                            procedure well. The quality of the bowel                             preparation was poor. Scope In: 1:49:39 PM Scope Out: 2:17:27 PM Total Procedure Duration: 0 hours 27 minutes 48 seconds  Findings:      The perianal and digital rectal examinations were normal.      A large amount of solid stool was found in the sigmoid colon, making       visualization difficult. I did not observe any masses up to 35 cm from       the sigmoid but given her poor prep it would be difficult to determine       if there were any lesions in the sigmoid. The scope could not be       advanced past this area as there was significant amount of stool.      A 8 mm polyp was found in the sigmoid colon. The polyp was sessile. This       could not be removed as there was significant amount of stool covering       the polyp despite trying to clean it multiple times.      A frond-like/villous and fungating non-obstructing large mass was found       at 5 cm proximal to the anus. The lesion had two localized areas of       depression. The mass was non-circumferential. The mass measured one cm       in length. In addition, its diameter measured thirty mm. No bleeding was       present. This was biopsied with a cold forceps for histology.      No additional abnormalities were found on retroflexion. Impression:               - Preparation of the colon was poor.                           - Stool in the sigmoid colon.                           - One 8 mm polyp in the sigmoid colon.                           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 Biopsied. Moderate Sedation:      Per Anesthesia Care Recommendation:           - Resume previous diet.                           - Start Miralax TID once tolerating oral                            medications.                           - Await pathology results.                           - Return patient to hospital ward for ongoing care.                           - May require a colonoscopy with full  prep if                            patient desires to further evaluate the area of                            narrowing in rectosigmoid. Procedure Code(s):        --- Professional ---                           [phone], Sigmoidoscopy, flexible; with biopsy, single                            or multiple Diagnosis Code(s):        --- Professional ---                           K63.5, Polyp of colon                           D49.0, Neoplasm of unspecified behavior of                            digestive system                           R93.3, Abnormal findings on diagnostic imaging of                            other parts of digestive tract CPT copyright 2019 [employer]. All rights reserved. The codes documented in this report are preliminary and upon coder review may  be revised to meet current compliance requirements. Maylon Peppers, MD Maylon Peppers,  06/29/2021 12:48:46 PM This report has been signed electronically. Number of Addenda: 0

## 2021-06-29 NOTE — Progress Notes (Signed)
Patient Demographics:    Debra Doyle, is a 87 y.o. female, DOB - 05-03-1929, WQ:[id number]  Admit date - 06/24/2021   Admitting Physician [name], MD  Outpatient Primary MD for the patient is [name], MD  LOS - 5   Chief Complaint  Patient presents with   Abdominal Pain        Subjective:    Debra Doyle was seen and examined this morning, complaining of pain and discomfort otherwise awake alert oriented in bed.  NG tube still in place Per GI recommendations, nursing staff trying to initiate p.o. intake soft/liquid  Status post EGD, flex sigmoidoscopy by gastroenterologist on 06/28/2021, 5 cm mass in the rectal area was discovered, biopsy obtained pathology pending  Overnight patient had a urinary retention, patient was catheterized in/out, Likely due to rectal mass, evaluating for Foley catheter placement-patient is agreeable   Assessment  & Plan :    Principal Problem:   Gastric distention Active Problems:   AKI (acute kidney injury) ([place])   Hyponatremia   Mild protein-calorie malnutrition (HCC)   Hypotension   Brief Summary:- 87 y.o. female, with history of coronary artery disease/Prior MI/ischemic cardiomyopathy and HTN--admitted on 06/25/2021 with abdominal pain nausea and vomiting and found to have significant gastric distention on CT abdomen and pelvis   Assessment/plan:   1)Abdominal pain with Nausea and Vomiting- Gasteric Ulcer/5 cm anal mass -Improved, NG tube still in place   -- significant gastric distention on CT abdomen and Pelvis -Continue NG tube----(NG tube replaced on 06/26/21) -c/n IV fluids and PPI  EGD and colonoscopy on EGD and flex sig- - EGD: Nonbleeding Gastric ulcer, --Flex sig shows lower: anal mass 5 cm from anal verge --- status postbiopsy pending pathology     2)Short segment area of Narrowing S/p EGD and flex sig on 8/16/2022g at the  Rectosigmoid junction with a moderately dilated distal sigmoid colon--- rectal stool ball noted on CT abdomen and pelvis as well  -- Cannot rule out  sigmoid stricture/malignancy----- -GI consult appreciated  Flex sig shows lower: anal mass 5 cm from anal verge --- biopsy/pathology pending -Continue laxatives -Anticipating surgical consultation    3)Dehydration/AKI/HypoMagnesiemia/HypoKalemia/HypoNatremia--- in the setting of poor oral intake and nausea vomiting----continue IV fluids Na 124 >> 129>>133 >> 137   4)Transiet Hypotension--suspect due to dehydration and AKI as noted above in #3 above, rather than frank sepsis AM Cortisol is 26.2  --WBC 11.1 >> 10.4>> 8.1>>6.0 >>  PCT is 0.23 Lactic acid 2.8 >>1.5 (lactic acidosis may be due to dehydration and poor tissue perfusion) -Discontinued vancomycin, cefepime and Flagyl   Disposition/Need for in-Hospital Stay- patient unable to be discharged at this time due to -requiring IV fluids until able to tolerate oral intake, likely have a stricture due to rectal mass, needing further evaluation from GI and possibly surgery  Status is: Inpatient  Remains inpatient appropriate because: Please see disposition above  Disposition: The patient is from: Home              Anticipated d/c is to: Home with HH Vs SNF              Anticipated d/c date is: 2 days  Patient currently is not medically stable to d/c. Barriers: Not Clinically Stable-   Code Status :  -  Code Status: DNR   Family Communication:   (patient is alert, awake and coherent)  Son [name] at [phone] Consults  :  Gi  DVT Prophylaxis  :   - SCDs   Place and maintain sequential compression device Start: 06/27/21 2139 SCDs Start: 06/24/21 2330    Lab Results  Component Value Date   PLT 165 06/29/2021    Inpatient Medications  Scheduled Meds:  carvedilol  6.25 mg Oral BID WC   lidocaine  1 patch Transdermal Q24H   pantoprazole (PROTONIX) IV  40 mg  Intravenous Q12H   polyethylene glycol  17 g Oral BID   senna-docusate  2 tablet Oral BID   sodium phosphate  1 enema Rectal Once   Continuous Infusions:  dextrose 5 % and 0.9 % NaCl with KCl 20 mEq/L 75 mL/hr at 06/29/21 0121   PRN Meds:.acetaminophen **OR** acetaminophen, benzonatate, morphine injection, ondansetron **OR** ondansetron (ZOFRAN) IV, phenol    Anti-infectives (From admission, onward)    Start     Dose/Rate Route Frequency Ordered Stop   06/25/21 1800  ceFEPIme (MAXIPIME) 2 g in sodium chloride 0.9 % 100 mL IVPB  Status:  Discontinued        2 g 200 mL/hr over 30 Minutes Intravenous Every 24 hours 06/24/21 1851 06/27/21 1255   06/25/21 1800  vancomycin (VANCOREADY) IVPB 500 mg/100 mL        500 mg 100 mL/hr over 60 Minutes Intravenous Every 24 hours 06/24/21 1856 06/25/21 2359   06/24/21 1830  ceFEPIme (MAXIPIME) 2 g in sodium chloride 0.9 % 100 mL IVPB        2 g 200 mL/hr over 30 Minutes Intravenous  Once 06/24/21 1829 06/24/21 2118   06/24/21 1830  metroNIDAZOLE (FLAGYL) IVPB 500 mg  Status:  Discontinued        500 mg 100 mL/hr over 60 Minutes Intravenous Every 8 hours 06/24/21 1829 06/27/21 1255   06/24/21 1830  vancomycin (VANCOCIN) IVPB 1000 mg/200 mL premix        1,000 mg 200 mL/hr over 60 Minutes Intravenous  Once 06/24/21 1829 06/24/21 2118         Objective:   Vitals:   06/28/21 1545 06/28/21 1745 06/28/21 2045 06/29/21 0607  BP: 119/90  (!) 157/108 (!) 160/100  Pulse: 79 94 (!) 107 (!) 103  Resp: (!) 30 (!) '24 20 20  '$ Temp:  (!) 97.5 F (36.4 C) (!) 97.5 F (36.4 C) 98.1 F (36.7 C)  TempSrc:  Oral Oral Oral  SpO2: 97% 97% 98%   Weight:      Height:        Wt Readings from Last 3 Encounters:  06/28/21 51.7 kg  03/31/21 51.7 kg  01/29/20 56.7 kg     Intake/Output Summary (Last 24 hours) at 06/29/2021 1233 Last data filed at 06/29/2021 0900 Gross per 24 hour  Intake 561.41 ml  Output 1800 ml  Net -1238.59 ml       Physical  Exam:   General:  Alert, oriented, cooperative, no distress;   HEENT:  Normocephalic, PERRL, otherwise with in Normal limits   Neuro:  CNII-XII intact. , normal motor and sensation, reflexes intact   Lungs:   Clear to auscultation BL, Respirations unlabored, no wheezes / crackles  Cardio:    S1/S2, RRR, No murmure, No Rubs or Gallops   Abdomen:  Soft, non-tender, bowel sounds active all four quadrants,  no guarding or peritoneal signs.  Muscular skeletal:  Severe global generalized weaknesses Limited exam - in bed, able to move all 4 extremities, Normal strength,  2+ pulses,  symmetric, No pitting edema  Skin:  Dry, warm to touch, negative for any Rashes,  Wounds: Please see nursing documentation  Pressure Injury 06/28/21 Coccyx Mid Deep Tissue Pressure Injury - Purple or maroon localized area of discolored intact skin or blood-filled blister due to damage of underlying soft tissue from pressure and/or shear. (Active)  06/28/21 2030  Location: Coccyx  Location Orientation: Mid  Staging: Deep Tissue Pressure Injury - Purple or maroon localized area of discolored intact skin or blood-filled blister due to damage of underlying soft tissue from pressure and/or shear.  Wound Description (Comments):   Present on Admission: Yes          Data Review:   Micro Results Recent Results (from the past 240 hour(s))  Blood Culture (routine x 2)     Status: None   Collection Time: 06/24/21  6:35 PM   Specimen: Right Antecubital; Blood  Result Value Ref Range Status   Specimen Description RIGHT ANTECUBITAL  Final   Special Requests   Final    BOTTLES DRAWN AEROBIC AND ANAEROBIC Blood Culture adequate volume   Culture   Final    NO GROWTH 5 DAYS Performed at [facility], [address], [place], [place] [address]    Report Status 06/29/2021 FINAL  Final  Blood Culture (routine x 2)     Status: None   Collection Time: 06/24/21  6:42 PM   Specimen: Left Antecubital; Blood  Result Value Ref  Range Status   Specimen Description LEFT ANTECUBITAL  Final   Special Requests   Final    BOTTLES DRAWN AEROBIC AND ANAEROBIC Blood Culture adequate volume   Culture   Final    NO GROWTH 5 DAYS Performed at [facility], [address], [place], [place] [address]    Report Status 06/29/2021 FINAL  Final  SARS CORONAVIRUS 2 (TAT 6-24 HRS) Nasopharyngeal Nasopharyngeal Swab     Status: None   Collection Time: 06/25/21  1:52 AM   Specimen: Nasopharyngeal Swab  Result Value Ref Range Status   SARS Coronavirus 2 NEGATIVE NEGATIVE Final    Comment: (NOTE) SARS-CoV-2 target nucleic acids are NOT DETECTED.  The SARS-CoV-2 RNA is generally detectable in upper and lower respiratory specimens during the acute phase of infection. Negative results do not preclude SARS-CoV-2 infection, do not rule out co-infections with other pathogens, and should not be used as the sole basis for treatment or other patient management decisions. Negative results must be combined with clinical observations, patient history, and epidemiological information. The expected result is Negative.  Fact Sheet for Patients: SugarRoll.be  Fact Sheet for Healthcare Providers: https://www.woods-mathews.com/  This test is not yet approved or cleared by the [place] FDA and  has been authorized for detection and/or diagnosis of SARS-CoV-2 by FDA under an Emergency Use Authorization (EUA). This EUA will remain  in effect (meaning this test can be used) for the duration of the COVID-19 declaration under Se ction 564(b)(1) of the Act, 21 U.S.C. section 360bbb-3(b)(1), unless the authorization is terminated or revoked sooner.  Performed at [facility], [place] [address], [place],  [address]     Radiology Reports CT ABDOMEN PELVIS W CONTRAST  Result Date: 06/24/2021 CLINICAL DATA:  Abdominal distension EXAM: CT ABDOMEN AND PELVIS WITH  CONTRAST TECHNIQUE:  Multidetector CT imaging of the abdomen and pelvis was performed using the standard protocol following bolus administration of intravenous contrast. CONTRAST:  82m OMNIPAQUE IOHEXOL 350 MG/ML SOLN COMPARISON:  CT 01/28/2019 FINDINGS: Lower chest: Bibasilar atelectasis. Heart size is mildly enlarged. Coronary artery calcifications are seen. Hepatobiliary: No focal liver abnormality is seen. No gallstones, gallbladder wall thickening, or biliary dilatation. Pancreas: Mildly atrophic, but appears grossly unremarkable. Spleen: Normal in size without focal abnormality. Adrenals/Urinary Tract: Unremarkable adrenal glands. Kidneys enhance symmetrically without focal lesion, stone, or hydronephrosis. Ureters are nondilated. Urinary bladder appears unremarkable. Stomach/Bowel: Markedly dilated stomach with fluid and air. No evidence of mechanical outlet obstruction. There is a moderate-sized rectal stool ball. Short segment area of narrowing at the rectosigmoid junction with a moderately dilated distal sigmoid colon. Moderate volume of stool throughout the remaining colon. What appears to be a normal appendix is seen in the right lower quadrant (series 4, image 51). No dilated loops of small bowel are seen within the abdomen. Vascular/Lymphatic: Atherosclerotic calcifications are present throughout the aortoiliac axis. No abdominopelvic lymphadenopathy. Reproductive: Uterus and bilateral adnexa are unremarkable. Other: No free fluid. No abdominopelvic fluid collection. No pneumoperitoneum. No abdominal wall hernia. Musculoskeletal: Diffuse osseous demineralization. Vertebra plana deformity of the T10 vertebral body, progressed from 2020. Mild chronic L2 compression deformity. IMPRESSION: 1. Markedly dilated stomach with fluid and air. No evidence of mechanical outlet obstruction. 2. Short segment area of narrowing at the rectosigmoid junction with a moderately dilated distal sigmoid colon. Findings may represent a focal  area of peristalsis, although a colonic mass is not entirely excluded. A follow-up colonoscopy could be considered. 3. Moderate-sized rectal stool ball. 4. Vertebra plana deformity of the T10 vertebral body, progressed from 2020. Mild chronic L2 compression deformity. 5. Aortic atherosclerosis (ICD10-I70.0). Electronically Signed   By: NDavina PokeD.O.   On: 06/24/2021 20:07   DG CHEST PORT 1 VIEW  Result Date: 06/26/2021 CLINICAL DATA:  NG tube placement EXAM: PORTABLE CHEST 1 VIEW COMPARISON:  None. FINDINGS: NG tube with tip in the gastric body and side port below the GE junction. Low lung volumes.  Cardiac stent noted. IMPRESSION: NG tube with tip in stomach. Electronically Signed   By: SSuzy BouchardM.D.   On: 06/26/2021 09:17   DG CHEST PORT 1 VIEW  Result Date: 06/25/2021 CLINICAL DATA:  Cough, NG tube placement EXAM: PORTABLE CHEST 1 VIEW COMPARISON:  01/29/2019 FINDINGS: Lungs are essentially clear.  No pleural effusion or pneumothorax. The heart is normal in size.  Coronary stent. Enteric tube courses into the mid stomach. IMPRESSION: Enteric tube courses into the mid stomach. Electronically Signed   By: SJulian HyM.D.   On: 06/25/2021 02:44   DG ABD ACUTE 2+V W 1V CHEST  Result Date: 06/27/2021 CLINICAL DATA:  Abdominal pain and gastric distension. Patient with severe pain throughout. Hx of HTN and MI. Negative covid-19 test x 3 days ago. EXAM: DG ABDOMEN ACUTE WITH 1 VIEW CHEST COMPARISON:  the previous day's study FINDINGS: Low lung volumes with patchy subsegmental atelectasis at the lung bases. Heart size upper limits normal for technique. Aortic Atherosclerosis (ICD10-170.0). Coronary stent. Blunting of the left lateral costophrenic angle suggesting small effusion. No pneumothorax. No free air. Normal bowel gas pattern. Nasogastric tube extends into the decompressed stomach. Urinary bladder is distended. Regional bones unremarkable. IMPRESSION: 1. Distended urinary bladder.  2. Low volumes with patchy subsegmental atelectasis at the lung bases, possible small left effusion. 3. For nasogastric tube  to the decompressed stomach. Electronically Signed   By: Lucrezia Europe M.D.   On: 06/27/2021 07:29   DG ABD ACUTE 2+V W 1V CHEST  Result Date: 06/26/2021 CLINICAL DATA:  87 year old female with abdominal pain. EXAM: DG ABDOMEN ACUTE WITH 1 VIEW CHEST COMPARISON:  CT Abdomen and Pelvis 06/24/2021 and earlier. FINDINGS: Portable upright and supine views at 0442 hours. Enteric tube has been placed into the stomach, with evidence of resolved gastric distension since the recent CT. Mildly distended urinary bladder with excreted IV contrast. Continued low lung volumes. No pneumothorax or pneumoperitoneum identified. Increased hypo ventilation at the left lung base. Stable cardiac size and mediastinal contours. Gas-filled bowel loops elsewhere in the abdomen and pelvis appear stable from the recent CT. Osteopenia. No acute osseous abnormality identified. IMPRESSION: 1. Enteric tube placed into the stomach with evidence of gastric decompression since the recent CT. 2. Otherwise stable bowel gas pattern.  No pneumoperitoneum. 3. Interval decreased ventilation at the left lung base, perhaps atelectasis. Electronically Signed   By: Genevie Ann M.D.   On: 06/26/2021 05:25   DG Abdomen Acute W/Chest  Result Date: 06/24/2021 CLINICAL DATA:  Abdominal pain and distension. EXAM: DG ABDOMEN ACUTE WITH 1 VIEW CHEST COMPARISON:  CT 01/28/2019 FINDINGS: Anti lordotic positioning. Low lung volumes. Borderline cardiomegaly with coronary stent. No acute airspace disease or pleural effusion. No pneumothorax. No free intra-abdominal air. Marked gaseous gastric distension, similar gastric distension seen on prior exam when the stomach is fluid-filled. Large volume of colonic stool. There is also likely gaseous distension of transverse colon, although air-filled structure coursing across the mid abdomen may be  distended stomach. No obvious small bowel dilatation. Prominent vascular calcifications are seen. The bones are diffusely under mineralized. No obvious acute osseous abnormality. Surgical clips in the right breast. IMPRESSION: 1. Marked gaseous gastric distension. The stomach was previously prominently distended with fluid. Air-filled structure coursing transversely across the mid abdomen may be gaseous distension of transverse colon or gastric distension. 2. Large volume of colonic stool. 3. Low lung volumes without acute abnormality in the chest. Electronically Signed   By: Keith Rake M.D.   On: 06/24/2021 17:59     CBC Recent Labs  Lab 06/24/21 1711 06/25/21 0455 06/26/21 0554 06/27/21 2208 06/28/21 0329 06/28/21 1151 06/29/21 0408  WBC 11.1* 10.4 8.1  --  6.0  --  4.5  HGB 14.1 12.2 11.6* 12.4 11.3* 12.1 11.5*  HCT 40.3 33.8* 33.6* 34.8* 31.7* 35.2* 33.6*  PLT 198 170 156  --  154  --  165  MCV 96.0 94.7 97.7  --  96.4  --  95.5  MCH 33.6 34.2* 33.7  --  34.3*  --  32.7  MCHC 35.0 36.1* 34.5  --  35.6  --  34.2  RDW 13.3 13.2 13.7  --  13.8  --  13.9  LYMPHSABS 1.0 0.8  --   --   --   --   --   MONOABS 0.6 0.7  --   --   --   --   --   EOSABS 0.1 0.0  --   --   --   --   --   BASOSABS 0.0 0.0  --   --   --   --   --     Chemistries  Recent Labs  Lab 06/24/21 1711 06/25/21 0455 06/26/21 0554 06/28/21 0329 06/29/21 0408  NA 124* 128* 129* 133* 137  K 4.1 3.0* 3.7 3.7 4.3  CL 85* 97* 102 106 109  CO2 27 23 21* 22 23  GLUCOSE 188* 100* 164* 131* 201*  BUN 27* 32* '18 11 10  '$ CREATININE 1.25* 0.85 0.74 0.74 0.65  CALCIUM 8.6* 8.0* 7.8* 7.8* 8.3*  MG  --  1.7  --   --   --   AST '28 23 23  '$ --   --   ALT '19 16 14  '$ --   --   ALKPHOS 110 89 78  --   --   BILITOT 1.0 1.1 0.6  --   --    ------------------------------------------------------------------------------------------------------------------ No results for input(s): CHOL, HDL, LDLCALC, TRIG, CHOLHDL, LDLDIRECT in  the last 72 hours.  Lab Results  Component Value Date   HGBA1C 5.0 11/25/2013   ------------------------------------------------------------------------------------------------------------------ No results for input(s): TSH, T4TOTAL, T3FREE, THYROIDAB in the last 72 hours.  Invalid input(s): FREET3 ------------------------------------------------------------------------------------------------------------------ No results for input(s): VITAMINB12, FOLATE, FERRITIN, TIBC, IRON, RETICCTPCT in the last 72 hours.  Coagulation profile Recent Labs  Lab 06/24/21 1832 06/25/21 0455  INR 1.2 1.2    No results for input(s): DDIMER in the last 72 hours.  Cardiac Enzymes No results for input(s): CKMB, TROPONINI, MYOGLOBIN in the last 168 hours.  Invalid input(s): CK ------------------------------------------------------------------------------------------------------------------ No results found for: BNP   Deatra James M.D on 06/29/2021 at 12:33 PM  Go to [email or web address] - for contact info  [facility] - Office  [phone]

## 2021-06-29 NOTE — Progress Notes (Signed)
Subjective: Patient states she is feeling okay this morning, she is unsure of when her last BM was, thinks she may have soiled herself. She denies any abdominal pain or nausea. States she was able to eat the pudding, drink the juice that was on her breakfast tray. She denies much of an appetite.   Objective: Vital signs in last 24 hours: Temp:  [97.5 F (36.4 C)-98.1 F (36.7 C)] 98.1 F (36.7 C) (08/17 0607) Pulse Rate:  [79-107] 103 (08/17 0607) Resp:  [20-30] 20 (08/17 0607) BP: (114-160)/(77-115) 160/100 (08/17 0607) SpO2:  [96 %-100 %] 98 % (08/16 2045) Weight:  [51.7 kg] 51.7 kg (08/16 1241) Last BM Date: 06/26/21 General:   Alert and oriented, pleasant Head:  Normocephalic and atraumatic. Eyes:  No icterus, sclera clear. Conjuctiva pink.  Mouth:  Without lesions, mucosa pink and moist.  Heart:  S1, S2 present, no murmurs noted.  Lungs: Clear to auscultation bilaterally, without wheezing, rales, or rhonchi.  Abdomen:  Bowel sounds present, hyperactive, soft, non-tender, remains moderately distended. No HSM or hernias noted. No rebound or guarding. No masses appreciated  Msk:  Symmetrical without gross deformities. Normal posture. Pulses:  Normal pulses noted. Extremities:  Without clubbing or edema. Neurologic:  Alert and  oriented x4;  grossly normal neurologically. Skin:  Warm and dry, intact without significant lesions.  Psych:  Alert and cooperative. Normal mood and affect.  Intake/Output from previous day: 08/16 0701 - 08/17 0700 In: 321.4 [I.V.:321.4] Out: 1800 [Urine:1800] Intake/Output this shift: No intake/output data recorded.  Lab Results: Recent Labs    06/28/21 0329 06/28/21 1151 06/29/21 0408  WBC 6.0  --  4.5  HGB 11.3* 12.1 11.5*  HCT 31.7* 35.2* 33.6*  PLT 154  --  165   BMET Recent Labs    06/28/21 0329 06/29/21 0408  NA 133* 137  K 3.7 4.3  CL 106 109  CO2 22 23  GLUCOSE 131* 201*  BUN 11 10  CREATININE 0.74 0.65  CALCIUM 7.8* 8.3*     Assessment: Pleasant 87 year old female with history of CAD, Prior MI, ischemic cardiomyopathy, and HTN, presenting [facility] ED on 8/12 with abdominal pain, distention and nausea and vomiting. CT abd/pelvis revealed markedly dilated stomach with fluid and air, short segment area of narrowing at rectosigmoid junction with moderately dilated distal sigmoid, moderate-sized rectal stool ball. NG tube placed for decompression with improvement in abdominal distention and symptoms. Fecal disimpaction by hospitalist at bedside on 8/14.   Previous EGD in 2020 revealed LA Grade D esophagitis and non-bleeding duodenal ulcers.  EGD and flex sig done yesterday by Dr. Jenetta Downer, bowel prep was poor, revealed non-bleeding gastric ulcer, normal duodenum. Flex sig revealed large amount of solid stool in sigmoid area, making vizualiation difficult, 77m polyp found in sigmoid colon that was sessile, could not be removed due to stool presence. Non obstructing large mass found at 5cm proximal to anus with two localized areas of depression, mass was non circumferential and measured 1cm in length, diameter measured 371m No bleeding present. Biopsy was taken.   Patient may benefit from colonoscopy outpatient, with full prep for further investigation of narrowing of rectosigmoid.  NG tube remains in place with dark brown/reddish discharge, approx 700cc in canister. Abdomen still remains distended but soft.  Plan: -advance diet as tolerated -Continue PPI BID -Start miralax TID once tolerating oral meds -Awaiting pathology from biopsies -May require colonoscopy with full prep if patient desire further investigation of narrowing in rectosigmoid.  LOS: 5 days    06/29/2021, 9:21 AM  Rector Devonshire L. Alver Sorrow, MSN, APRN, AGNP-C Adult-Gerontology Nurse Practitioner [facility]

## 2021-06-29 NOTE — Progress Notes (Signed)
Pt unable to void this shift. Patient bladder scanned at 2300 bladder scan volume 900, I/O performed 700cc of amber urine returned. Bladder scan at 0615, volume >999, I/O 1100cc of amber urine returned, BP also elevated 160/100 manually, MD notified, orders received.

## 2021-06-30 ENCOUNTER — Inpatient Hospital Stay (HOSPITAL_COMMUNITY): Payer: PPO

## 2021-06-30 DIAGNOSIS — K5900 Constipation, unspecified: Secondary | ICD-10-CM | POA: Diagnosis not present

## 2021-06-30 DIAGNOSIS — R140 Abdominal distension (gaseous): Secondary | ICD-10-CM

## 2021-06-30 DIAGNOSIS — K6289 Other specified diseases of anus and rectum: Secondary | ICD-10-CM

## 2021-06-30 DIAGNOSIS — K3189 Other diseases of stomach and duodenum: Secondary | ICD-10-CM | POA: Diagnosis not present

## 2021-06-30 LAB — BASIC METABOLIC PANEL
Anion gap: 4 — ABNORMAL LOW (ref 5–15)
BUN: 8 mg/dL (ref 8–23)
CO2: 23 mmol/L (ref 22–32)
Calcium: 7.8 mg/dL — ABNORMAL LOW (ref 8.9–10.3)
Chloride: 112 mmol/L — ABNORMAL HIGH (ref 98–111)
Creatinine, Ser: 0.7 mg/dL (ref 0.44–1.00)
GFR, Estimated: >60 mL/min (ref >60)
Glucose, Bld: 124 mg/dL — ABNORMAL HIGH (ref 70–99)
Potassium: 3.9 mmol/L (ref 3.5–5.1)
Sodium: 139 mmol/L (ref 135–145)

## 2021-06-30 LAB — GLUCOSE, CAPILLARY
Glucose-Capillary: 106 mg/dL — ABNORMAL HIGH (ref 70–99)
Glucose-Capillary: 110 mg/dL — ABNORMAL HIGH (ref 70–99)
Glucose-Capillary: 168 mg/dL — ABNORMAL HIGH (ref 70–99)

## 2021-06-30 LAB — SURGICAL PATHOLOGY

## 2021-06-30 MED ORDER — AMLODIPINE BESYLATE 5 MG PO TABS
5.0000 mg | ORAL_TABLET | Freq: Every day | ORAL | Status: DC
  Administered 2021-06-30 – 2021-07-03 (×4): 5 mg via ORAL
  Filled 2021-06-30 (×5): qty 1

## 2021-06-30 MED ORDER — CARVEDILOL 12.5 MG PO TABS
12.5000 mg | ORAL_TABLET | Freq: Two times a day (BID) | ORAL | Status: DC
  Administered 2021-06-30 – 2021-07-03 (×7): 12.5 mg via ORAL
  Filled 2021-06-30 (×8): qty 1

## 2021-06-30 MED ORDER — POLYETHYLENE GLYCOL 3350 17 G PO PACK
17.0000 g | PACK | Freq: Three times a day (TID) | ORAL | Status: DC
  Administered 2021-06-30 – 2021-07-03 (×10): 17 g via ORAL
  Filled 2021-06-30 (×14): qty 1

## 2021-06-30 NOTE — Progress Notes (Signed)
Patient Demographics:    Debra Doyle, is a 87 y.o. female, DOB - 02-08-1929, WQ:[id number]  Admit date - 06/24/2021   Admitting Physician [name], MD  Outpatient Primary MD for the patient is [name], MD  LOS - 6  CC: Abdominal pain,   Subjective:    Debra Doyle and examined this morning, mild distended abdomen, but soft, denies any pain.  NG tube still in place   Assessment  & Plan :    Principal Problem:   Gastric distention Active Problems:   AKI (acute kidney injury) ([place])   Hyponatremia   Mild protein-calorie malnutrition (HCC)   Hypotension   Brief Summary:- 87 y.o. female, with history of coronary artery disease/Prior MI/ischemic cardiomyopathy and HTN--admitted on 06/25/2021 with abdominal pain nausea and vomiting and found to have significant gastric distention on CT abdomen and pelvis   Assessment/plan:   1)Abdominal pain with Nausea and Vomiting -Improved pain nausea and vomiting tolerating clear - Gasteric Ulcer/5 cm anal mass -Improved, NG tube still in place   -- significant gastric distention on CT abdomen and Pelvis -Continue NG tube----(NG tube replaced on 06/26/21) -c/n IV fluids and PPI   5 cm anal mass - EGD and colonoscopy on EGD and flex sig- - EGD: Nonbleeding Gastric ulcer, --Flex sig shows lower: anal mass 5 cm from anal verge --- status postbiopsy pending pathology  -Biopsy results   2)Short segment area of Narrowing S/p EGD and flex sig on 8/16/2022g at the Rectosigmoid junction with a moderately dilated distal sigmoid colon--- rectal stool ball noted on CT abdomen and pelvis as well  -- Cannot rule out  sigmoid stricture/malignancy----- -GI following closely, further input appreciated Flex sig shows lower: anal mass 5 cm from anal verge --- biopsy/pathology pending -Continue laxatives -Consulted, following in the periphery     3)Dehydration/AKI/HypoMagnesiemia/HypoKalemia/HypoNatremia-- - in the setting of poor oral intake and nausea vomiting----continue IV fluids - Na 124 >> 129>>133 >> 137, 139 -Improving   4)Transiet Hypotension- -suspect due to dehydration and AKI as noted above in #3 above, rather than frank sepsis AM Cortisol is 26.2  --WBC 11.1 >> 10.4>> 8.1>>6.0 >>  PCT is 0.23 Lactic acid 2.8 >>1.5 (lactic acidosis may be due to dehydration and poor tissue perfusion) -Discontinued vancomycin, cefepime and Flagyl   5) Hypertension -Increasing Coreg 12.5 mg p.o. twice daily, adding Norvasc 5 mg daily   Disposition/Need for in-Hospital Stay- patient unable to be discharged at this time due to -requiring IV fluids until able to tolerate oral intake, likely have a stricture due to rectal mass, needing further evaluation from GI and possibly surgery  Status is: Inpatient  Remains inpatient appropriate because: Please see disposition above  Disposition: The patient is from: Home              Anticipated d/c is to: Home with HH Vs SNF              Anticipated d/c date is: 2 days              Patient currently is not medically stable to d/c. Barriers: Not Clinically Stable-   Code Status :  -  Code Status: DNR   Family Communication:   (patient is alert, awake and  coherent)  Son [name] at [phone] Consults  :  Gi  DVT Prophylaxis  :   - SCDs   Place and maintain sequential compression device Start: 06/27/21 2139 SCDs Start: 06/24/21 2330    Lab Results  Component Value Date   PLT 165 06/29/2021    Inpatient Medications  Scheduled Meds:  amLODipine  5 mg Oral Daily   carvedilol  12.5 mg Oral BID WC   lidocaine  1 patch Transdermal Q24H   pantoprazole (PROTONIX) IV  40 mg Intravenous Q12H   polyethylene glycol  17 g Oral BID   senna-docusate  2 tablet Oral BID   sodium phosphate  1 enema Rectal Once   Continuous Infusions:  dextrose 5 % and 0.9 % NaCl with KCl 20 mEq/L 75  mL/hr at 06/29/21 0121   PRN Meds:.acetaminophen **OR** acetaminophen, benzonatate, hydrALAZINE, morphine injection, ondansetron **OR** ondansetron (ZOFRAN) IV, phenol    Anti-infectives (From admission, onward)    Start     Dose/Rate Route Frequency Ordered Stop   06/25/21 1800  ceFEPIme (MAXIPIME) 2 g in sodium chloride 0.9 % 100 mL IVPB  Status:  Discontinued        2 g 200 mL/hr over 30 Minutes Intravenous Every 24 hours 06/24/21 1851 06/27/21 1255   06/25/21 1800  vancomycin (VANCOREADY) IVPB 500 mg/100 mL        500 mg 100 mL/hr over 60 Minutes Intravenous Every 24 hours 06/24/21 1856 06/25/21 2359   06/24/21 1830  ceFEPIme (MAXIPIME) 2 g in sodium chloride 0.9 % 100 mL IVPB        2 g 200 mL/hr over 30 Minutes Intravenous  Once 06/24/21 1829 06/24/21 2118   06/24/21 1830  metroNIDAZOLE (FLAGYL) IVPB 500 mg  Status:  Discontinued        500 mg 100 mL/hr over 60 Minutes Intravenous Every 8 hours 06/24/21 1829 06/27/21 1255   06/24/21 1830  vancomycin (VANCOCIN) IVPB 1000 mg/200 mL premix        1,000 mg 200 mL/hr over 60 Minutes Intravenous  Once 06/24/21 1829 06/24/21 2118         Objective:   Vitals:   06/29/21 1353 06/29/21 2026 06/30/21 0518 06/30/21 0951  BP: (!) 151/92 (!) 161/105 (!) 158/84 (!) 160/97  Pulse: 82 79 62 89  Resp: '20 19 19   '$ Temp: (!) 97.5 F (36.4 C) 97.9 F (36.6 C) 97.8 F (36.6 C)   TempSrc: Oral Oral Oral   SpO2: 99% 99% 99%   Weight:      Height:        Wt Readings from Last 3 Encounters:  06/28/21 51.7 kg  03/31/21 51.7 kg  01/29/20 56.7 kg     Intake/Output Summary (Last 24 hours) at 06/30/2021 1453 Last data filed at 06/30/2021 0900 Gross per 24 hour  Intake 240 ml  Output 400 ml  Net -160 ml       Physical Exam:   General:  Alert, oriented, cooperative, no distress;   HEENT:  Normocephalic, PERRL, otherwise with in Normal limits   Neuro:  CNII-XII intact. , normal motor and sensation, reflexes intact   Lungs:    Clear to auscultation BL, Respirations unlabored, no wheezes / crackles  Cardio:    S1/S2, RRR, No murmure, No Rubs or Gallops   Abdomen:   Soft, mild-moderate distended non-tender, bowel sounds active all four quadrants,  no guarding or peritoneal signs.  Muscular skeletal:  Limited exam - in bed, able to  move all 4 extremities, Normal strength,  2+ pulses,  symmetric, No pitting edema  Skin:  Dry, warm to touch, negative for any Rashes,  Wounds: Please see nursing documentation  Pressure Injury 06/28/21 Coccyx Mid Deep Tissue Pressure Injury - Purple or maroon localized area of discolored intact skin or blood-filled blister due to damage of underlying soft tissue from pressure and/or shear. (Active)  06/28/21 2030  Location: Coccyx  Location Orientation: Mid  Staging: Deep Tissue Pressure Injury - Purple or maroon localized area of discolored intact skin or blood-filled blister due to damage of underlying soft tissue from pressure and/or shear.  Wound Description (Comments):   Present on Admission: Yes           Data Review:   Micro Results Recent Results (from the past 240 hour(s))  Blood Culture (routine x 2)     Status: None   Collection Time: 06/24/21  6:35 PM   Specimen: Right Antecubital; Blood  Result Value Ref Range Status   Specimen Description RIGHT ANTECUBITAL  Final   Special Requests   Final    BOTTLES DRAWN AEROBIC AND ANAEROBIC Blood Culture adequate volume   Culture   Final    NO GROWTH 5 DAYS Performed at [facility], [address] [place], [place] [address]    Report Status 06/29/2021 FINAL  Final  Blood Culture (routine x 2)     Status: None   Collection Time: 06/24/21  6:42 PM   Specimen: Left Antecubital; Blood  Result Value Ref Range Status   Specimen Description LEFT ANTECUBITAL  Final   Special Requests   Final    BOTTLES DRAWN AEROBIC AND ANAEROBIC Blood Culture adequate volume   Culture   Final    NO GROWTH 5 DAYS Performed at [facility], [address] [place], [place] [address]    Report Status 06/29/2021 FINAL  Final  SARS CORONAVIRUS 2 (TAT 6-24 HRS) Nasopharyngeal Nasopharyngeal Swab     Status: None   Collection Time: 06/25/21  1:52 AM   Specimen: Nasopharyngeal Swab  Result Value Ref Range Status   SARS Coronavirus 2 NEGATIVE NEGATIVE Final    Comment: (NOTE) SARS-CoV-2 target nucleic acids are NOT DETECTED.  The SARS-CoV-2 RNA is generally detectable in upper and lower respiratory specimens during the acute phase of infection. Negative results do not preclude SARS-CoV-2 infection, do not rule out co-infections with other pathogens, and should not be used as the sole basis for treatment or other patient management decisions. Negative results must be combined with clinical observations, patient history, and epidemiological information. The expected result is Negative.  Fact Sheet for Patients: SugarRoll.be  Fact Sheet for Healthcare Providers: https://www.woods-mathews.com/  This test is not yet approved or cleared by the [place] FDA and  has been authorized for detection and/or diagnosis of SARS-CoV-2 by FDA under an Emergency Use Authorization (EUA). This EUA will remain  in effect (meaning this test can be used) for the duration of the COVID-19 declaration under Se ction 564(b)(1) of the Act, 21 U.S.C. section 360bbb-3(b)(1), unless the authorization is terminated or revoked sooner.  Performed at [facility], [place] [address], [place], [place] [address]     Radiology Reports CT ABDOMEN PELVIS W CONTRAST  Result Date: 06/24/2021 CLINICAL DATA:  Abdominal distension EXAM: CT ABDOMEN AND PELVIS WITH CONTRAST TECHNIQUE: Multidetector CT imaging of the abdomen and pelvis was performed using the standard protocol following bolus administration of intravenous contrast. CONTRAST:  77m OMNIPAQUE IOHEXOL 350  MG/ML SOLN COMPARISON:  CT 01/28/2019 FINDINGS:  Lower chest: Bibasilar atelectasis. Heart size is mildly enlarged. Coronary artery calcifications are seen. Hepatobiliary: No focal liver abnormality is seen. No gallstones, gallbladder wall thickening, or biliary dilatation. Pancreas: Mildly atrophic, but appears grossly unremarkable. Spleen: Normal in size without focal abnormality. Adrenals/Urinary Tract: Unremarkable adrenal glands. Kidneys enhance symmetrically without focal lesion, stone, or hydronephrosis. Ureters are nondilated. Urinary bladder appears unremarkable. Stomach/Bowel: Markedly dilated stomach with fluid and air. No evidence of mechanical outlet obstruction. There is a moderate-sized rectal stool ball. Short segment area of narrowing at the rectosigmoid junction with a moderately dilated distal sigmoid colon. Moderate volume of stool throughout the remaining colon. What appears to be a normal appendix is seen in the right lower quadrant (series 4, image 51). No dilated loops of small bowel are seen within the abdomen. Vascular/Lymphatic: Atherosclerotic calcifications are present throughout the aortoiliac axis. No abdominopelvic lymphadenopathy. Reproductive: Uterus and bilateral adnexa are unremarkable. Other: No free fluid. No abdominopelvic fluid collection. No pneumoperitoneum. No abdominal wall hernia. Musculoskeletal: Diffuse osseous demineralization. Vertebra plana deformity of the T10 vertebral body, progressed from 2020. Mild chronic L2 compression deformity. IMPRESSION: 1. Markedly dilated stomach with fluid and air. No evidence of mechanical outlet obstruction. 2. Short segment area of narrowing at the rectosigmoid junction with a moderately dilated distal sigmoid colon. Findings may represent a focal area of peristalsis, although a colonic mass is not entirely excluded. A follow-up colonoscopy could be considered. 3. Moderate-sized rectal stool ball. 4. Vertebra plana deformity of the T10 vertebral body, progressed from 2020. Mild  chronic L2 compression deformity. 5. Aortic atherosclerosis (ICD10-I70.0). Electronically Signed   By: Davina Poke D.O.   On: 06/24/2021 20:07   DG CHEST PORT 1 VIEW  Result Date: 06/26/2021 CLINICAL DATA:  NG tube placement EXAM: PORTABLE CHEST 1 VIEW COMPARISON:  None. FINDINGS: NG tube with tip in the gastric body and side port below the GE junction. Low lung volumes.  Cardiac stent noted. IMPRESSION: NG tube with tip in stomach. Electronically Signed   By: Suzy Bouchard M.D.   On: 06/26/2021 09:17   DG CHEST PORT 1 VIEW  Result Date: 06/25/2021 CLINICAL DATA:  Cough, NG tube placement EXAM: PORTABLE CHEST 1 VIEW COMPARISON:  01/29/2019 FINDINGS: Lungs are essentially clear.  No pleural effusion or pneumothorax. The heart is normal in size.  Coronary stent. Enteric tube courses into the mid stomach. IMPRESSION: Enteric tube courses into the mid stomach. Electronically Signed   By: Julian Hy M.D.   On: 06/25/2021 02:44   DG ABD ACUTE 2+V W 1V CHEST  Result Date: 06/27/2021 CLINICAL DATA:  Abdominal pain and gastric distension. Patient with severe pain throughout. Hx of HTN and MI. Negative covid-19 test x 3 days ago. EXAM: DG ABDOMEN ACUTE WITH 1 VIEW CHEST COMPARISON:  the previous day's study FINDINGS: Low lung volumes with patchy subsegmental atelectasis at the lung bases. Heart size upper limits normal for technique. Aortic Atherosclerosis (ICD10-170.0). Coronary stent. Blunting of the left lateral costophrenic angle suggesting small effusion. No pneumothorax. No free air. Normal bowel gas pattern. Nasogastric tube extends into the decompressed stomach. Urinary bladder is distended. Regional bones unremarkable. IMPRESSION: 1. Distended urinary bladder. 2. Low volumes with patchy subsegmental atelectasis at the lung bases, possible small left effusion. 3. For nasogastric tube to the decompressed stomach. Electronically Signed   By: Lucrezia Europe M.D.   On: 06/27/2021 07:29   DG ABD  ACUTE 2+V W 1V CHEST  Result Date: 06/26/2021 CLINICAL DATA:  87 year Doyle female with abdominal pain. EXAM: DG ABDOMEN ACUTE WITH 1 VIEW CHEST COMPARISON:  CT Abdomen and Pelvis 06/24/2021 and earlier. FINDINGS: Portable upright and supine views at 0442 hours. Enteric tube has been placed into the stomach, with evidence of resolved gastric distension since the recent CT. Mildly distended urinary bladder with excreted IV contrast. Continued low lung volumes. No pneumothorax or pneumoperitoneum identified. Increased hypo ventilation at the left lung base. Stable cardiac size and mediastinal contours. Gas-filled bowel loops elsewhere in the abdomen and pelvis appear stable from the recent CT. Osteopenia. No acute osseous abnormality identified. IMPRESSION: 1. Enteric tube placed into the stomach with evidence of gastric decompression since the recent CT. 2. Otherwise stable bowel gas pattern.  No pneumoperitoneum. 3. Interval decreased ventilation at the left lung base, perhaps atelectasis. Electronically Signed   By: Genevie Ann M.D.   On: 06/26/2021 05:25   DG Abdomen Acute W/Chest  Result Date: 06/24/2021 CLINICAL DATA:  Abdominal pain and distension. EXAM: DG ABDOMEN ACUTE WITH 1 VIEW CHEST COMPARISON:  CT 01/28/2019 FINDINGS: Anti lordotic positioning. Low lung volumes. Borderline cardiomegaly with coronary stent. No acute airspace disease or pleural effusion. No pneumothorax. No free intra-abdominal air. Marked gaseous gastric distension, similar gastric distension seen on prior exam when the stomach is fluid-filled. Large volume of colonic stool. There is also likely gaseous distension of transverse colon, although air-filled structure coursing across the mid abdomen may be distended stomach. No obvious small bowel dilatation. Prominent vascular calcifications are seen. The bones are diffusely under mineralized. No obvious acute osseous abnormality. Surgical clips in the right breast. IMPRESSION: 1. Marked  gaseous gastric distension. The stomach was previously prominently distended with fluid. Air-filled structure coursing transversely across the mid abdomen may be gaseous distension of transverse colon or gastric distension. 2. Large volume of colonic stool. 3. Low lung volumes without acute abnormality in the chest. Electronically Signed   By: Keith Rake M.D.   On: 06/24/2021 17:59     CBC Recent Labs  Lab 06/24/21 1711 06/25/21 0455 06/26/21 0554 06/27/21 2208 06/28/21 0329 06/28/21 1151 06/29/21 0408  WBC 11.1* 10.4 8.1  --  6.0  --  4.5  HGB 14.1 12.2 11.6* 12.4 11.3* 12.1 11.5*  HCT 40.3 33.8* 33.6* 34.8* 31.7* 35.2* 33.6*  PLT 198 170 156  --  154  --  165  MCV 96.0 94.7 97.7  --  96.4  --  95.5  MCH 33.6 34.2* 33.7  --  34.3*  --  32.7  MCHC 35.0 36.1* 34.5  --  35.6  --  34.2  RDW 13.3 13.2 13.7  --  13.8  --  13.9  LYMPHSABS 1.0 0.8  --   --   --   --   --   MONOABS 0.6 0.7  --   --   --   --   --   EOSABS 0.1 0.0  --   --   --   --   --   BASOSABS 0.0 0.0  --   --   --   --   --     Chemistries  Recent Labs  Lab 06/24/21 1711 06/25/21 0455 06/26/21 0554 06/28/21 0329 06/29/21 0408 06/30/21 0538  NA 124* 128* 129* 133* 137 139  K 4.1 3.0* 3.7 3.7 4.3 3.9  CL 85* 97* 102 106 109 112*  CO2 27 23 21* '22 23 23  '$ GLUCOSE 188* 100* 164* 131* 201* 124*  BUN 27* 32* '18 11 10 8  '$ CREATININE 1.25* 0.85 0.74 0.74 0.65 0.70  CALCIUM 8.6* 8.0* 7.8* 7.8* 8.3* 7.8*  MG  --  1.7  --   --   --   --   AST '28 23 23  '$ --   --   --   ALT '19 16 14  '$ --   --   --   ALKPHOS 110 89 78  --   --   --   BILITOT 1.0 1.1 0.6  --   --   --    ------------------------------------------------------------------------------------------------------------------ No results for input(s): CHOL, HDL, LDLCALC, TRIG, CHOLHDL, LDLDIRECT in the last 72 hours.  Lab Results  Component Value Date   HGBA1C 5.0 11/25/2013    ------------------------------------------------------------------------------------------------------------------ No results for input(s): TSH, T4TOTAL, T3FREE, THYROIDAB in the last 72 hours.  Invalid input(s): FREET3 ------------------------------------------------------------------------------------------------------------------ No results for input(s): VITAMINB12, FOLATE, FERRITIN, TIBC, IRON, RETICCTPCT in the last 72 hours.  Coagulation profile Recent Labs  Lab 06/24/21 1832 06/25/21 0455  INR 1.2 1.2    No results for input(s): DDIMER in the last 72 hours.  Cardiac Enzymes No results for input(s): CKMB, TROPONINI, MYOGLOBIN in the last 168 hours.  Invalid input(s): CK ------------------------------------------------------------------------------------------------------------------ No results found for: BNP   Deatra James M.D on 06/30/2021 at 2:53 PM  Go to [email or web address] - for contact info  [facility] - Office  [phone]

## 2021-06-30 NOTE — Progress Notes (Signed)
Subjective:  NG tube in place but was not hooked up to canister. Only 100cc of fluid in canister from day shift. She was eating full liquid tray. Denied abdominal pain.   Objective: Vital signs in last 24 hours: Temp:  [97.8 F (36.6 C)-98 F (36.7 C)] 98 F (36.7 C) (08/18 1456) Pulse Rate:  [62-89] 75 (08/18 1456) Resp:  [18-19] 18 (08/18 1456) BP: (158-164)/(84-105) 164/88 (08/18 1456) SpO2:  [99 %] 99 % (08/18 1456) Last BM Date: 06/29/21 General:   Alert,  Well-developed, well-nourished, pleasant and cooperative in NAD Head:  Normocephalic and atraumatic. Eyes:  Sclera clear, no icterus.  Abdomen:  Soft, nontender, some distention. Normal bowel sounds, without guarding, and without rebound.   Extremities:  Without clubbing, deformity or edema. Neurologic:  Alert and  oriented x4;  grossly normal neurologically. Skin:  Intact without significant lesions or rashes. Psych:  Alert and cooperative. Normal mood and affect.  Intake/Output from previous day: 08/17 0701 - 08/18 0700 In: 480 [P.O.:480] Out: 600 [Urine:400; Emesis/NG output:200] Intake/Output this shift: Total I/O In: 480 [P.O.:480] Out: 600 [Emesis/NG output:600]  Lab Results: CBC Recent Labs    06/28/21 0329 06/28/21 1151 06/29/21 0408  WBC 6.0  --  4.5  HGB 11.3* 12.1 11.5*  HCT 31.7* 35.2* 33.6*  MCV 96.4  --  95.5  PLT 154  --  165   BMET Recent Labs    06/28/21 0329 06/29/21 0408 06/30/21 0538  NA 133* 137 139  K 3.7 4.3 3.9  CL 106 109 112*  CO2 '22 23 23  '$ GLUCOSE 131* 201* 124*  BUN '11 10 8  '$ CREATININE 0.74 0.65 0.70  CALCIUM 7.8* 8.3* 7.8*   LFTs No results for input(s): BILITOT, BILIDIR, IBILI, ALKPHOS, AST, ALT, PROT, ALBUMIN in the last 72 hours. No results for input(s): LIPASE in the last 72 hours. PT/INR No results for input(s): LABPROT, INR in the last 72 hours.    Imaging Studies: CT ABDOMEN PELVIS WO CONTRAST  Result Date: 06/30/2021 CLINICAL DATA:  Abdominal distension,  rectal mass on sigmoidoscopy performed yesterday. EXAM: CT ABDOMEN AND PELVIS WITHOUT CONTRAST TECHNIQUE: Multidetector CT imaging of the abdomen and pelvis was performed following the standard protocol without IV contrast. COMPARISON:  Multiple exams, including 06/24/2021 FINDINGS: Lower chest: Small bilateral pleural effusions with atelectasis in both lower lobes and mild atelectasis in the right middle lobe and lingula. Clips are noted along the upper glandular tissues of the right breast. Coronary atherosclerosis most notable in the left anterior descending coronary artery. Descending thoracic aortic atherosclerosis. Hepatobiliary: Poor visualization of the gallbladder. Otherwise unremarkable. Pancreas: Unremarkable Spleen: Unremarkable Adrenals/Urinary Tract: Foley catheter in the otherwise empty urinary bladder. No hydronephrosis or hydroureter. Normal renal contours. No renal calculi observed. Stomach/Bowel: A nasogastric tube terminates in the stomach body. There less distention of the stomach than on the prior exam. Increased thickening of the gastric wall probably related to the lesser degree of distension compared to previous. A rectal tube is in place and contrast was administered via the rectal tube. 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 The rectum is fairly distended (possibly due to contrast filling), there is transition to smaller caliber at the rectosigmoid junction which partially fills with contrast medium for example on image 54 series 2. There is formed stool in the sigmoid colon along with  various diverticular compatible with diverticulosis. The amount of formed stool in the colon is pronounced compatible with constipation and there is likely some redundancy of the sigmoid colon which is otherwise difficult to follow due  to the paucity of intra-adipose tissue in this patient which makes separation of bowel loops difficult. The transverse colon does course down into the left lower quadrant and then back up towards the spleen, compatible with redundant transverse colon. No overtly dilated small bowel loops are identified. There is stranding in the perirectal space for example on image 65 of series 2 which may indicate inflammation or third spacing of fluid, some of this was also present on 06/24/2021. There is no extraluminal gas or substantial perirectal hematoma identified. Soft tissue density anterior to the rectum is compatible with uterine tissues. Vascular/Lymphatic: Aortoiliac atherosclerotic vascular disease. Reproductive: Uterus visualized although the contour is fairly indistinct. Other: There is potentially low-level mesenteric edema as well. Edema tracks into the left upper thigh region anteriorly and along the pubis. Musculoskeletal: There is a transverse fracture of the lower sternum on image 67 series 6. Age indeterminate nondisplaced rib fractures of the left fifth through tenth ribs. Stable prominent compression fracture at T10 and stable mild endplate compression fractures at L2 and along the superior endplate of L3. Stable mild superior endplate compression fracture at L4. IMPRESSION: 1. Interval decompression of the stomach with a nasogastric tube. 2. Rectal contrast was administered and L lined some anterior rectal irregularity probably corresponding to the biopsy tumor. 3. Prominent stool throughout the colon favors constipation. Redundant sigmoid colon and redundant transverse colon. 4. There is some increased presacral and perirectal stranding although this may be part of the or widespread third spacing of fluid process with small bilateral pleural effusions, bilateral subcutaneous edema, and mild mesenteric edema. 5. No findings of extraluminal gas or obvious abscess. Paucity of intra-adipose tissue makes  separating adjacent bowel loops difficult. 6. Bibasilar atelectasis. 7. Subacute sternal fractures and subacute fractures of the left fifth through tenth ribs. 8. Prior compression fractures at T10, L2, L3, and L4. 9.  Aortic Atherosclerosis (ICD10-I70.0). Electronically Signed   By: Van Clines M.D.   On: 06/30/2021 14:57   CT ABDOMEN PELVIS W CONTRAST  Result Date: 06/24/2021 CLINICAL DATA:  Abdominal distension EXAM: CT ABDOMEN AND PELVIS WITH CONTRAST TECHNIQUE: Multidetector CT imaging of the abdomen and pelvis was performed using the standard protocol following bolus administration of intravenous contrast. CONTRAST:  75m OMNIPAQUE IOHEXOL 350 MG/ML SOLN COMPARISON:  CT 01/28/2019 FINDINGS: Lower chest: Bibasilar atelectasis. Heart size is mildly enlarged. Coronary artery calcifications are seen. Hepatobiliary: No focal liver abnormality is seen. No gallstones, gallbladder wall thickening, or biliary dilatation. Pancreas: Mildly atrophic, but appears grossly unremarkable. Spleen: Normal in size without focal abnormality. Adrenals/Urinary Tract: Unremarkable adrenal glands. Kidneys enhance symmetrically without focal lesion, stone, or hydronephrosis. Ureters are nondilated. Urinary bladder appears unremarkable. Stomach/Bowel: Markedly dilated stomach with fluid and air. No evidence of mechanical outlet obstruction. There is a moderate-sized rectal stool ball. Short segment area of narrowing at the rectosigmoid junction with a moderately dilated distal sigmoid colon. Moderate volume of stool throughout the remaining colon. What appears to be a normal appendix is seen in the right lower quadrant (series 4, image 51). No dilated loops of small bowel are seen within the abdomen. Vascular/Lymphatic: Atherosclerotic calcifications are present throughout the aortoiliac axis. No abdominopelvic lymphadenopathy. Reproductive: Uterus and bilateral adnexa are unremarkable. Other: No free fluid. No abdominopelvic  fluid collection. No pneumoperitoneum. No  abdominal wall hernia. Musculoskeletal: Diffuse osseous demineralization. Vertebra plana deformity of the T10 vertebral body, progressed from 2020. Mild chronic L2 compression deformity. IMPRESSION: 1. Markedly dilated stomach with fluid and air. No evidence of mechanical outlet obstruction. 2. Short segment area of narrowing at the rectosigmoid junction with a moderately dilated distal sigmoid colon. Findings may represent a focal area of peristalsis, although a colonic mass is not entirely excluded. A follow-up colonoscopy could be considered. 3. Moderate-sized rectal stool ball. 4. Vertebra plana deformity of the T10 vertebral body, progressed from 2020. Mild chronic L2 compression deformity. 5. Aortic atherosclerosis (ICD10-I70.0). Electronically Signed   By: Davina Poke D.O.   On: 06/24/2021 20:07   DG CHEST PORT 1 VIEW  Result Date: 06/26/2021 CLINICAL DATA:  NG tube placement EXAM: PORTABLE CHEST 1 VIEW COMPARISON:  None. FINDINGS: NG tube with tip in the gastric body and side port below the GE junction. Low lung volumes.  Cardiac stent noted. IMPRESSION: NG tube with tip in stomach. Electronically Signed   By: Suzy Bouchard M.D.   On: 06/26/2021 09:17   DG CHEST PORT 1 VIEW  Result Date: 06/25/2021 CLINICAL DATA:  Cough, NG tube placement EXAM: PORTABLE CHEST 1 VIEW COMPARISON:  01/29/2019 FINDINGS: Lungs are essentially clear.  No pleural effusion or pneumothorax. The heart is normal in size.  Coronary stent. Enteric tube courses into the mid stomach. IMPRESSION: Enteric tube courses into the mid stomach. Electronically Signed   By: Julian Hy M.D.   On: 06/25/2021 02:44   DG ABD ACUTE 2+V W 1V CHEST  Result Date: 06/27/2021 CLINICAL DATA:  Abdominal pain and gastric distension. Patient with severe pain throughout. Hx of HTN and MI. Negative covid-19 test x 3 days ago. EXAM: DG ABDOMEN ACUTE WITH 1 VIEW CHEST COMPARISON:  the previous  day's study FINDINGS: Low lung volumes with patchy subsegmental atelectasis at the lung bases. Heart size upper limits normal for technique. Aortic Atherosclerosis (ICD10-170.0). Coronary stent. Blunting of the left lateral costophrenic angle suggesting small effusion. No pneumothorax. No free air. Normal bowel gas pattern. Nasogastric tube extends into the decompressed stomach. Urinary bladder is distended. Regional bones unremarkable. IMPRESSION: 1. Distended urinary bladder. 2. Low volumes with patchy subsegmental atelectasis at the lung bases, possible small left effusion. 3. For nasogastric tube to the decompressed stomach. Electronically Signed   By: Lucrezia Europe M.D.   On: 06/27/2021 07:29   DG ABD ACUTE 2+V W 1V CHEST  Result Date: 06/26/2021 CLINICAL DATA:  87 year old female with abdominal pain. EXAM: DG ABDOMEN ACUTE WITH 1 VIEW CHEST COMPARISON:  CT Abdomen and Pelvis 06/24/2021 and earlier. FINDINGS: Portable upright and supine views at 0442 hours. Enteric tube has been placed into the stomach, with evidence of resolved gastric distension since the recent CT. Mildly distended urinary bladder with excreted IV contrast. Continued low lung volumes. No pneumothorax or pneumoperitoneum identified. Increased hypo ventilation at the left lung base. Stable cardiac size and mediastinal contours. Gas-filled bowel loops elsewhere in the abdomen and pelvis appear stable from the recent CT. Osteopenia. No acute osseous abnormality identified. IMPRESSION: 1. Enteric tube placed into the stomach with evidence of gastric decompression since the recent CT. 2. Otherwise stable bowel gas pattern.  No pneumoperitoneum. 3. Interval decreased ventilation at the left lung base, perhaps atelectasis. Electronically Signed   By: Genevie Ann M.D.   On: 06/26/2021 05:25   DG Abdomen Acute W/Chest  Result Date: 06/24/2021 CLINICAL DATA:  Abdominal pain and distension. EXAM: DG  ABDOMEN ACUTE WITH 1 VIEW CHEST COMPARISON:  CT  01/28/2019 FINDINGS: Anti lordotic positioning. Low lung volumes. Borderline cardiomegaly with coronary stent. No acute airspace disease or pleural effusion. No pneumothorax. No free intra-abdominal air. Marked gaseous gastric distension, similar gastric distension seen on prior exam when the stomach is fluid-filled. Large volume of colonic stool. There is also likely gaseous distension of transverse colon, although air-filled structure coursing across the mid abdomen may be distended stomach. No obvious small bowel dilatation. Prominent vascular calcifications are seen. The bones are diffusely under mineralized. No obvious acute osseous abnormality. Surgical clips in the right breast. IMPRESSION: 1. Marked gaseous gastric distension. The stomach was previously prominently distended with fluid. Air-filled structure coursing transversely across the mid abdomen may be gaseous distension of transverse colon or gastric distension. 2. Large volume of colonic stool. 3. Low lung volumes without acute abnormality in the chest. Electronically Signed   By: Keith Rake M.D.   On: 06/24/2021 17:59  [2 weeks]   Assessment: Pleasant 87 year old female with history of CAD, prior MI, ischemic cardiomyopathy, hypertension presenting to [facility], ED August 12 with abdominal pain, distention, nausea/vomiting.  CT abdomen pelvis revealed markedly dilated stomach with fluid and air, short segment area of narrowing at the rectosigmoid junction with moderately dilated distal sigmoid, moderate sized rectal stool ball.  NG tube placed for decompression with improvement in abdominal distention and symptoms.  Fecal disimpaction by hospitalist at bedside on August 14.  EGD and flexible sigmoidoscopy August 16 with Dr. Jenetta Downer, revealed nonbleeding gastric ulcer, normal duodenum.  Large amount of solid stool in the sigmoid area, making visualization difficult, 8 mm polyp found in the sigmoid colon that was sessile, could not be  removed due to stool presence.  Nonobstructing large mass found at 5 cm proximal to the anus with 2 localized areas of depression, mass was not circumferential and measured 1 cm in length, measuring 30 mm.  No bleeding present.  Biopsy pending.  CT abdomen pelvis with rectal contrast today showing some anterior rectal irregularity probably corresponding to biopsy of tumor.  Prominent stool throughout the colon, redundant sigmoid colon and redundant transverse colon.  Subacute sternal fractures and subacute fractures of the left fifth through 10th ribs.  Some increased presacral and perirectal stranding possibly due to third spacing.   Plan: F/U path as available. Clear liquid diet.  Continue bowel regimen.  Increase Miralax to 17 grams TID while inpatient.  Based on path results, may may need to consider colonoscopy with full bowel.   Laureen Ochs. Bernarda Caffey [facility] [phone] 8/18/20228:55 PM    LOS: 6 days

## 2021-06-30 NOTE — Consult Note (Signed)
[place]  Reason for Consult: Rectal mass, sigmoid stricture?  Referring Physician: Dr. Jamesetta Geralds   Chief Complaint   Abdominal Pain     HPI: Debra Doyle is a 87 y.o. female with CAD, HTN, ischemic cardiomyopathy who is independent and lives alone but has help with other ADL like cooking. She presented with back pain and bloating and had reported eating small meals for months and having some nausea and vomiting of food. CT done in the Ed demonstrated impaction and possible sigmoid narrowing, gastric distention. She was disimpacted by the hospitalist. She was seen by GI and had a Egd that demonstrated an ulcer and a flex sigmoidoscopy that only passed into the rectum and note a non obstructing rectal mass.  She had a rectal contrasted CT today noting the rectal mass but no sigmoid narrowing or stricture reported. She remains constipated. Pathology is not back on the rectal mass.   She is pleasant and able to answer most questions.  Past Medical History:  Diagnosis Date   Coronary atherosclerosis of native coronary artery    a. s/p DES x4 to LAD in 2012, ISR in 11/2013 with thrombectomy and angioplasty alone of LAD   Essential hypertension    Fibrocystic breast disease    Hx of colonic polyps    Ischemic cardiomyopathy    Myocardial infarction [facility])    NSTEMI 1/12   ST elevation (STEMI) myocardial infarction involving left anterior descending coronary artery [facility])    January 2015 - LAD stent thrombosis off DAPT    Past Surgical History:  Procedure Laterality Date   BIOPSY  06/28/2021   Procedure: BIOPSY;  Surgeon: Harvel Quale, MD;  Location: [facility];  Service: Gastroenterology;;   CATARACT EXTRACTION, BILATERAL     Bilateral   ESOPHAGOGASTRODUODENOSCOPY N/A 01/29/2019   Procedure: ESOPHAGOGASTRODUODENOSCOPY (EGD);  Surgeon: Rogene Houston, MD;  Location: [facility];  Service: Endoscopy;  Laterality: N/A;    ESOPHAGOGASTRODUODENOSCOPY (EGD) WITH PROPOFOL N/A 06/28/2021   Procedure: ESOPHAGOGASTRODUODENOSCOPY (EGD) WITH PROPOFOL;  Surgeon: Harvel Quale, MD;  [facility]: [facility];  Service: Gastroenterology;  Laterality: N/A;   FLEXIBLE SIGMOIDOSCOPY N/A 06/28/2021   Procedure: FLEXIBLE SIGMOIDOSCOPY;  Surgeon: Harvel Quale, MD;  [facility]: [facility];  Service: Gastroenterology;  Laterality: N/A;   LEFT HEART CATHETERIZATION WITH CORONARY ANGIOGRAM N/A 11/25/2013   Procedure: LEFT HEART CATHETERIZATION WITH CORONARY ANGIOGRAM;  Surgeon: Sinclair Grooms, MD;  Location: [facility] CATH LAB;  Service: Cardiovascular;  Laterality: N/A;   PERCUTANEOUS CORONARY STENT INTERVENTION (PCI-S)  11/25/2013   Procedure: PERCUTANEOUS CORONARY STENT INTERVENTION (PCI-S);  Surgeon: Sinclair Grooms, MD;  Location: [facility] CATH LAB;  Service: Cardiovascular;;    Family History  Problem Relation Age of Onset   Coronary artery disease Mother    Emphysema Father    Cancer Sister        1 sister/cancer type unknown   Stroke Brother        1 brother    Social History   Tobacco Use   Smoking status: Never   Smokeless tobacco: Never  Vaping Use   Vaping Use: Never used  Substance Use Topics   Alcohol use: No   Drug use: No    Medications: I have reviewed the patient's current medications. Prior to Admission:  Medications Prior to Admission  Medication Sig Dispense Refill Last Dose   acetaminophen (TYLENOL) 325 MG tablet Take 2 tablets (650 mg total) by mouth every 6 (  six) hours as needed for fever or headache (pain).      Ascorbic Acid (VITAMIN C) 500 MG tablet Take 500 mg by mouth daily.   Past Week   aspirin EC 81 MG tablet Take 1 tablet (81 mg total) by mouth daily.   06/24/2021 at 0800   atorvastatin (LIPITOR) 40 MG tablet TAKE 1 TABLET BY MOUTH EVERY DAY AT 6:00 PM (Patient taking differently: Take 40 mg by mouth daily.) 90 tablet 1 06/24/2021   carvedilol (COREG) 6.25 MG tablet TAKE 1  TABLET BY MOUTH TWICE DAILY 180 tablet 1 06/24/2021 at 0800   cyanocobalamin 1000 MCG tablet Take 1,000 mcg by mouth daily.   Past Week   furosemide (LASIX) 40 MG tablet Take 40 mg by mouth daily.    06/24/2021   ketoconazole (NIZORAL) 2 % cream Apply 1 application topically 2 (two) times daily.      losartan (COZAAR) 50 MG tablet TAKE 1 TABLET BY MOUTH EVERY DAY 90 tablet 3 06/24/2021   LUTEIN PO Take 1 capsule by mouth daily.   Past Week   nitroGLYCERIN (NITROSTAT) 0.4 MG SL tablet Place 0.4 mg under the tongue as needed for chest pain. Place one tablet under the tongue every 5 minutes x 3 doses max as needed for chest pain.  Contact MD or 911 with unrelieved pain      pantoprazole (PROTONIX) 40 MG tablet Take 40 mg by mouth daily.   06/28/2021   senna (SENOKOT) 8.6 MG TABS tablet Take 1 tablet by mouth daily as needed for mild constipation.   06/24/2021   Specialty Vitamins Products (BILBERRY PLUS PO) Take 1 capsule by mouth daily.   Past Week   spironolactone (ALDACTONE) 25 MG tablet TAKE 1/2 TABLET BY MOUTH EVERY DAY 45 tablet 1 06/24/2021   Zinc Oxide 10 % OINT Apply 1 application topically daily as needed (buttocks).      Scheduled:  amLODipine  5 mg Oral Daily   carvedilol  12.5 mg Oral BID WC   lidocaine  1 patch Transdermal Q24H   pantoprazole (PROTONIX) IV  40 mg Intravenous Q12H   polyethylene glycol  17 g Oral BID   senna-docusate  2 tablet Oral BID   sodium phosphate  1 enema Rectal Once   Continuous:  dextrose 5 % and 0.9 % NaCl with KCl 20 mEq/L 75 mL/hr at 06/29/21 0121   [id number] **OR** acetaminophen, benzonatate, hydrALAZINE, morphine injection, ondansetron **OR** ondansetron (ZOFRAN) IV, phenol  No Known Allergies   ROS:  A comprehensive review of systems was negative except for: Gastrointestinal: positive for constipation and bloating and distention  Blood pressure (!) 164/88, pulse 75, temperature 98 F (36.7 C), temperature source Oral, resp. rate 18,  height '5\' 1"'$  (1.549 m), weight 51.7 kg, SpO2 99 %. Physical Exam Vitals reviewed.  HENT:     Head: Normocephalic.  Eyes:     Extraocular Movements: Extraocular movements intact.  Cardiovascular:     Rate and Rhythm: Normal rate.  Pulmonary:     Effort: Pulmonary effort is normal.  Abdominal:     General: There is distension.     Palpations: Abdomen is soft.     Tenderness: There is generalized abdominal tenderness.  Skin:    General: Skin is warm.  Neurological:     General: No focal deficit present.     Mental Status: She is alert.  Psychiatric:        Mood and Affect: Mood normal.    Results: Results  for orders placed or performed during the hospital encounter of 06/24/21 (from the past 48 hour(s))  Glucose, capillary     Status: Abnormal   Collection Time: 06/28/21  6:51 PM  Result Value Ref Range   Glucose-Capillary 142 (H) 70 - 99 mg/dL    Comment: Glucose reference range applies only to samples taken after fasting for at least 8 hours.   Comment 1 Notify RN    Comment 2 Document in Chart   CBC     Status: Abnormal   Collection Time: 06/29/21  4:08 AM  Result Value Ref Range   WBC 4.5 4.0 - 10.5 K/uL   RBC 3.52 (L) 3.87 - 5.11 MIL/uL   Hemoglobin 11.5 (L) 12.0 - 15.0 g/dL   HCT 33.6 (L) 36.0 - 46.0 %   MCV 95.5 80.0 - 100.0 fL   MCH 32.7 26.0 - 34.0 pg   MCHC 34.2 30.0 - 36.0 g/dL   RDW 13.9 11.5 - 15.5 %   Platelets 165 150 - 400 K/uL   nRBC 0.0 0.0 - 0.2 %    Comment: Performed at [facility], [address] [place], [place] [id number]  Basic metabolic panel     Status: Abnormal   Collection Time: 06/29/21  4:08 AM  Result Value Ref Range   Sodium 137 135 - 145 mmol/L   Potassium 4.3 3.5 - 5.1 mmol/L   Chloride 109 98 - 111 mmol/L   CO2 23 22 - 32 mmol/L   Glucose, Bld 201 (H) 70 - 99 mg/dL    Comment: Glucose reference range applies only to samples taken after fasting for at least 8 hours.   BUN 10 8 - 23 mg/dL   Creatinine, Ser 0.65 0.44 - 1.00 mg/dL    Calcium 8.3 (L) 8.9 - 10.3 mg/dL   GFR, Estimated >60 >60 mL/min    Comment: (NOTE) Calculated using the CKD-EPI Creatinine Equation (2021)    Anion gap 5 5 - 15    Comment: Performed at [facility], [address] [place], [place] [address]  Glucose, capillary     Status: Abnormal   Collection Time: 06/29/21 11:33 AM  Result Value Ref Range   Glucose-Capillary 161 (H) 70 - 99 mg/dL    Comment: Glucose reference range applies only to samples taken after fasting for at least 8 hours.  Basic metabolic panel     Status: Abnormal   Collection Time: 06/30/21  5:38 AM  Result Value Ref Range   Sodium 139 135 - 145 mmol/L   Potassium 3.9 3.5 - 5.1 mmol/L   Chloride 112 (H) 98 - 111 mmol/L   CO2 23 22 - 32 mmol/L   Glucose, Bld 124 (H) 70 - 99 mg/dL    Comment: Glucose reference range applies only to samples taken after fasting for at least 8 hours.   BUN 8 8 - 23 mg/dL   Creatinine, Ser 0.70 0.44 - 1.00 mg/dL   Calcium 7.8 (L) 8.9 - 10.3 mg/dL   GFR, Estimated >60 >60 mL/min    Comment: (NOTE) Calculated using the CKD-EPI Creatinine Equation (2021)    Anion gap 4 (L) 5 - 15    Comment: Performed at [facility], [address] [place], [place] [address]  Glucose, capillary     Status: Abnormal   Collection Time: 06/30/21  7:28 AM  Result Value Ref Range   Glucose-Capillary 106 (H) 70 - 99 mg/dL    Comment: Glucose reference range applies only to samples  taken after fasting for at least 8 hours.  Glucose, capillary     Status: Abnormal   Collection Time: 06/30/21 11:10 AM  Result Value Ref Range   Glucose-Capillary 110 (H) 70 - 99 mg/dL    Comment: Glucose reference range applies only to samples taken after fasting for at least 8 hours.   Personally reviewed the CT from admission and CT with rectal contrast, note the known rectal mass, no obvious stricture but the rectum is enlarged and there is transition to smaller caliber at the rectosigmoid junction with stool and  contrast CT ABDOMEN PELVIS WO CONTRAST  Result Date: 06/30/2021 CLINICAL DATA:  Abdominal distension, rectal mass on sigmoidoscopy performed yesterday. EXAM: CT ABDOMEN AND PELVIS WITHOUT CONTRAST TECHNIQUE: Multidetector CT imaging of the abdomen and pelvis was performed following the standard protocol without IV contrast. COMPARISON:  Multiple exams, including 06/24/2021 FINDINGS: Lower chest: Small bilateral pleural effusions with atelectasis in both lower lobes and mild atelectasis in the right middle lobe and lingula. Clips are noted along the upper glandular tissues of the right breast. Coronary atherosclerosis most notable in the left anterior descending coronary artery. Descending thoracic aortic atherosclerosis. Hepatobiliary: Poor visualization of the gallbladder. Otherwise unremarkable. Pancreas: Unremarkable Spleen: Unremarkable Adrenals/Urinary Tract: Foley catheter in the otherwise empty urinary bladder. No hydronephrosis or hydroureter. Normal renal contours. No renal calculi observed. Stomach/Bowel: A nasogastric tube terminates in the stomach body. There less distention of the stomach than on the prior exam. Increased thickening of the gastric wall probably related to the lesser degree of distension compared to previous. A rectal tube is in place and contrast was administered via the rectal tube. 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 The rectum is fairly distended (possibly due to contrast filling), there is transition to smaller caliber at the rectosigmoid junction which partially fills with contrast medium for example on image 54 series 2. There is formed stool in the sigmoid colon along with various diverticular compatible with diverticulosis. The amount of formed stool in the colon is pronounced compatible with  constipation and there is likely some redundancy of the sigmoid colon which is otherwise difficult to follow due to the paucity of intra-adipose tissue in this patient which makes separation of bowel loops difficult. The transverse colon does course down into the left lower quadrant and then back up towards the spleen, compatible with redundant transverse colon. No overtly dilated small bowel loops are identified. There is stranding in the perirectal space for example on image 65 of series 2 which may indicate inflammation or third spacing of fluid, some of this was also present on 06/24/2021. There is no extraluminal gas or substantial perirectal hematoma identified. Soft tissue density anterior to the rectum is compatible with uterine tissues. Vascular/Lymphatic: Aortoiliac atherosclerotic vascular disease. Reproductive: Uterus visualized although the contour is fairly indistinct. Other: There is potentially low-level mesenteric edema as well. Edema tracks into the left upper thigh region anteriorly and along the pubis. Musculoskeletal: There is a transverse fracture of the lower sternum on image 67 series 6. Age indeterminate nondisplaced rib fractures of the left fifth through tenth ribs. Stable prominent compression fracture at T10 and stable mild endplate compression fractures at L2 and along the superior endplate of L3. Stable mild superior endplate compression fracture at L4. IMPRESSION: 1. Interval decompression of the stomach with a nasogastric  tube. 2. Rectal contrast was administered and L lined some anterior rectal irregularity probably corresponding to the biopsy tumor. 3. Prominent stool throughout the colon favors constipation. Redundant sigmoid colon and redundant transverse colon. 4. There is some increased presacral and perirectal stranding although this may be part of the or widespread third spacing of fluid process with small bilateral pleural effusions, bilateral subcutaneous edema, and mild  mesenteric edema. 5. No findings of extraluminal gas or obvious abscess. Paucity of intra-adipose tissue makes separating adjacent bowel loops difficult. 6. Bibasilar atelectasis. 7. Subacute sternal fractures and subacute fractures of the left fifth through tenth ribs. 8. Prior compression fractures at T10, L2, L3, and L4. 9.  Aortic Atherosclerosis (ICD10-I70.0). Electronically Signed   By: Van Clines M.D.   On: 06/30/2021 14:57     Assessment & Plan:  Debra Doyle is a 87 y.o. female with a rectal mass pathology pending, and some smaller caliber rectosigmoid junction but has stool and contrast in it, no obvious stricture or narrowing on my read or the radiologist read. She is constipated. I think she needs to ultimately get a full bowel prep and completion colonoscopy to further evaluate the remaining colon.   Awaiting rectal mass pathology Will discuss with Gi and hospitialist further.    All questions were answered to the satisfaction of the patient.   Virl Cagey 06/30/2021, 5:11 PM

## 2021-07-01 ENCOUNTER — Inpatient Hospital Stay (HOSPITAL_COMMUNITY): Payer: PPO

## 2021-07-01 DIAGNOSIS — R0602 Shortness of breath: Secondary | ICD-10-CM

## 2021-07-01 LAB — BASIC METABOLIC PANEL
Anion gap: 5 (ref 5–15)
BUN: 9 mg/dL (ref 8–23)
CO2: 22 mmol/L (ref 22–32)
Calcium: 8.1 mg/dL — ABNORMAL LOW (ref 8.9–10.3)
Chloride: 111 mmol/L (ref 98–111)
Creatinine, Ser: 0.55 mg/dL (ref 0.44–1.00)
GFR, Estimated: >60 mL/min (ref >60)
Glucose, Bld: 133 mg/dL — ABNORMAL HIGH (ref 70–99)
Potassium: 4 mmol/L (ref 3.5–5.1)
Sodium: 138 mmol/L (ref 135–145)

## 2021-07-01 LAB — GLUCOSE, CAPILLARY
Glucose-Capillary: 130 mg/dL — ABNORMAL HIGH (ref 70–99)
Glucose-Capillary: 134 mg/dL — ABNORMAL HIGH (ref 70–99)

## 2021-07-01 MED ORDER — METOCLOPRAMIDE HCL 5 MG/ML IJ SOLN
5.0000 mg | Freq: Three times a day (TID) | INTRAMUSCULAR | Status: DC
  Administered 2021-07-01 – 2021-07-04 (×9): 5 mg via INTRAVENOUS
  Filled 2021-07-01 (×8): qty 2

## 2021-07-01 MED ORDER — CHLORHEXIDINE GLUCONATE CLOTH 2 % EX PADS
6.0000 | MEDICATED_PAD | Freq: Every day | CUTANEOUS | Status: DC
  Administered 2021-07-02 – 2021-07-06 (×5): 6 via TOPICAL

## 2021-07-01 NOTE — Progress Notes (Signed)
Patient Demographics:    Debra Doyle, is a 87 y.o. female, DOB - 10/20/1929, [id number]  Admit date - 06/24/2021   Admitting Physician [name], MD  Outpatient Primary MD for the patient is [name], MD  LOS - 7  CC: Abdominal pain,   Subjective:    Debra Doyle was seen and examined abdomen mild distended, NG tube in place, reporting of 2 bowel movement and gas overnight.  Reported patient at initial biopsy of the rectal mass seems to be benign.  Discussed with patient possibility of gastroenterologist planning for colonoscopy for further evaluation as recommended...  She expresses understanding      Assessment  & Plan :    Principal Problem:   Gastric distention Active Problems:   AKI (acute kidney injury) (HCC)   Hyponatremia   Mild protein-calorie malnutrition (HCC)   Hypotension   Abdominal distension   Rectal mass   Constipation   Brief Summary:- 87 y.o. female, with history of coronary artery disease/Prior MI/ischemic cardiomyopathy and HTN--admitted on 06/25/2021 with abdominal pain nausea and vomiting and found to have significant gastric distention on CT abdomen and pelvis   Assessment/plan:   1)Abdominal pain with Nausea and Vomiting -abdominal distention -improved -NG tube still in place - Gasteric Ulcer/5 cm anal mass - significant gastric distention on CT abdomen and Pelvis -Continue NG tube----(NG tube replaced on 06/26/21) -c/n IV fluids and PPI -Continue MiraLAX, ambulation, following imaging, as needed Reglan   5 cm anal mass - EGD and colonoscopy on EGD and flex sig- - EGD: Nonbleeding Gastric ulcer, --Flex sig shows lower: anal mass 5 cm from anal verge --- status postbiopsy pending pathology  -Biopsy results --Path report -benign nonmalignant mass   2)Short segment area of Narrowing  S/p EGD and flex sig on 8/16/2022g at the Rectosigmoid  junction with a moderately dilated distal sigmoid colon--- rectal stool ball noted on CT abdomen and pelvis as well  -At this point profound stricture is ruled out with Gastrografin study-patient passing stool and gas -Malignancy of the mass has been ruled out -GI following anticipating colonoscopy -Pursuing bowel prep  -Continue laxatives --- Surgery following peripherally    3)Dehydration/AKI/HypoMagnesiemia/HypoKalemia/HypoNatremia-- -Due to poor p.o. intake, nausea vomiting-improved with treatment electrolytes and IV fluids - Na 124 >>>137, 139 -Improving   4)Transiet Hypotension- -Likely due to dehydration, volume depletion, sepsis was ruled out AM Cortisol is 26.2, with no leukocytosis PCT is 0.23 Lactic acid 2.8 >>1.5 (lactic acidosis may be due to dehydration and poor tissue perfusion) -Discontinued vancomycin, cefepime and Flagyl   5) Hypertension -Increasing Coreg 12.5 mg p.o. twice daily, adding Norvasc 5 mg daily -Monitoring and titrating medication accordingly   Disposition/Need for in-Hospital Stay- patient unable to be discharged at this time due to  -requiring IV fluids until able to tolerate oral intake, likely have a stricture due to rectal mass, needing further evaluation from GI and possibly surgery  Status is: Inpatient  Remains inpatient appropriate because: Please see disposition above  Disposition: The patient is from: Home              Anticipated d/c is to: Home with HH Vs SNF              Anticipated d/c date  is: 2 days              Patient currently is not medically stable to d/c. Barriers: Not Clinically Stable-   Code Status :  -  Code Status: DNR   Family Communication:   (patient is alert, awake and coherent)  Son [name] at [phone] Consults  :  Gi  DVT Prophylaxis  :   - SCDs   Place and maintain sequential compression device Start: 06/27/21 2139 SCDs Start: 06/24/21 2330    Lab Results  Component Value Date   PLT 165  06/29/2021    Inpatient Medications  Scheduled Meds:  amLODipine  5 mg Oral Daily   carvedilol  12.5 mg Oral BID WC   lidocaine  1 patch Transdermal Q24H   pantoprazole (PROTONIX) IV  40 mg Intravenous Q12H   polyethylene glycol  17 g Oral TID   senna-docusate  2 tablet Oral BID   sodium phosphate  1 enema Rectal Once   Continuous Infusions:  dextrose 5 % and 0.9 % NaCl with KCl 20 mEq/L 75 mL/hr at 07/01/21 1140   PRN Meds:.acetaminophen **OR** acetaminophen, benzonatate, hydrALAZINE, morphine injection, ondansetron **OR** ondansetron (ZOFRAN) IV, phenol    Anti-infectives (From admission, onward)    Start     Dose/Rate Route Frequency Ordered Stop   06/25/21 1800  ceFEPIme (MAXIPIME) 2 g in sodium chloride 0.9 % 100 mL IVPB  Status:  Discontinued        2 g 200 mL/hr over 30 Minutes Intravenous Every 24 hours 06/24/21 1851 06/27/21 1255   06/25/21 1800  vancomycin (VANCOREADY) IVPB 500 mg/100 mL        500 mg 100 mL/hr over 60 Minutes Intravenous Every 24 hours 06/24/21 1856 06/25/21 2359   06/24/21 1830  ceFEPIme (MAXIPIME) 2 g in sodium chloride 0.9 % 100 mL IVPB        2 g 200 mL/hr over 30 Minutes Intravenous  Once 06/24/21 1829 06/24/21 2118   06/24/21 1830  metroNIDAZOLE (FLAGYL) IVPB 500 mg  Status:  Discontinued        500 mg 100 mL/hr over 60 Minutes Intravenous Every 8 hours 06/24/21 1829 06/27/21 1255   06/24/21 1830  vancomycin (VANCOCIN) IVPB 1000 mg/200 mL premix        1,000 mg 200 mL/hr over 60 Minutes Intravenous  Once 06/24/21 1829 06/24/21 2118         Objective:   Vitals:   06/30/21 1456 06/30/21 1837 06/30/21 2146 07/01/21 0536  BP: (!) 164/88 (!) 174/114 (!) 135/92 (!) 154/91  Pulse: 75 91 89 82  Resp: '18  18 20  '$ Temp: 98 F (36.7 C)  98 F (36.7 C) 98.3 F (36.8 C)  TempSrc: Oral  Oral Oral  SpO2: 99%  96% 98%  Weight:      Height:        Wt Readings from Last 3 Encounters:  06/28/21 51.7 kg  03/31/21 51.7 kg  01/29/20 56.7 kg      Intake/Output Summary (Last 24 hours) at 07/01/2021 1230 Last data filed at 07/01/2021 0300 Gross per 24 hour  Intake 2537.16 ml  Output --  Net 2537.16 ml       Physical Exam:   General:  Alert, oriented, cooperative, no distress;   HEENT:  Normocephalic, PERRL, otherwise with in Normal limits   Neuro:  CNII-XII intact. , normal motor and sensation, reflexes intact   Lungs:   Clear to auscultation BL, Respirations unlabored, no  wheezes / crackles  Cardio:    S1/S2, RRR, No murmure, No Rubs or Gallops   Abdomen:   Soft, non-tender, positive bowel sounds, mildly distended.  Muscular skeletal:  Global generalized weaknesses,  Limited exam - in bed, able to move all 4 extremities,   2+ pulses,  symmetric, No pitting edema  Skin:  Dry, warm to touch, negative for any Rashes,  Wounds: Please see nursing documentation  Pressure Injury 06/28/21 Coccyx Mid Deep Tissue Pressure Injury - Purple or maroon localized area of discolored intact skin or blood-filled blister due to damage of underlying soft tissue from pressure and/or shear. (Active)  06/28/21 2030  Location: Coccyx  Location Orientation: Mid  Staging: Deep Tissue Pressure Injury - Purple or maroon localized area of discolored intact skin or blood-filled blister due to damage of underlying soft tissue from pressure and/or shear.  Wound Description (Comments):   Present on Admission: Yes               Data Review:   Micro Results Recent Results (from the past 240 hour(s))  Blood Culture (routine x 2)     Status: None   Collection Time: 06/24/21  6:35 PM   Specimen: Right Antecubital; Blood  Result Value Ref Range Status   Specimen Description RIGHT ANTECUBITAL  Final   Special Requests   Final    BOTTLES DRAWN AEROBIC AND ANAEROBIC Blood Culture adequate volume   Culture   Final    NO GROWTH 5 DAYS Performed at [facility], [address] [place], [place] [address]    Report Status 06/29/2021 FINAL  Final   Blood Culture (routine x 2)     Status: None   Collection Time: 06/24/21  6:42 PM   Specimen: Left Antecubital; Blood  Result Value Ref Range Status   Specimen Description LEFT ANTECUBITAL  Final   Special Requests   Final    BOTTLES DRAWN AEROBIC AND ANAEROBIC Blood Culture adequate volume   Culture   Final    NO GROWTH 5 DAYS Performed at [facility], [address] [place], [place] [address]    Report Status 06/29/2021 FINAL  Final  SARS CORONAVIRUS 2 (TAT 6-24 HRS) Nasopharyngeal Nasopharyngeal Swab     Status: None   Collection Time: 06/25/21  1:52 AM   Specimen: Nasopharyngeal Swab  Result Value Ref Range Status   SARS Coronavirus 2 NEGATIVE NEGATIVE Final    Comment: (NOTE) SARS-CoV-2 target nucleic acids are NOT DETECTED.  The SARS-CoV-2 RNA is generally detectable in upper and lower respiratory specimens during the acute phase of infection. Negative results do not preclude SARS-CoV-2 infection, do not rule out co-infections with other pathogens, and should not be used as the sole basis for treatment or other patient management decisions. Negative results must be combined with clinical observations, patient history, and epidemiological information. The expected result is Negative.  Fact Sheet for Patients: SugarRoll.be  Fact Sheet for Healthcare Providers: https://www.woods-mathews.com/  This test is not yet approved or cleared by the [place] FDA and  has been authorized for detection and/or diagnosis of SARS-CoV-2 by FDA under an Emergency Use Authorization (EUA). This EUA will remain  in effect (meaning this test can be used) for the duration of the COVID-19 declaration under Se ction 564(b)(1) of the Act, 21 U.S.C. section 360bbb-3(b)(1), unless the authorization is terminated or revoked sooner.  Performed at [facility], [place] [address], [place],  [address]     Radiology Reports CT ABDOMEN  PELVIS WO CONTRAST  Result Date: 06/30/2021 CLINICAL DATA:  Abdominal distension, rectal mass on sigmoidoscopy performed yesterday. EXAM: CT ABDOMEN AND PELVIS WITHOUT CONTRAST TECHNIQUE: Multidetector CT imaging of the abdomen and pelvis was performed following the standard protocol without IV contrast. COMPARISON:  Multiple exams, including 06/24/2021 FINDINGS: Lower chest: Small bilateral pleural effusions with atelectasis in both lower lobes and mild atelectasis in the right middle lobe and lingula. Clips are noted along the upper glandular tissues of the right breast. Coronary atherosclerosis most notable in the left anterior descending coronary artery. Descending thoracic aortic atherosclerosis. Hepatobiliary: Poor visualization of the gallbladder. Otherwise unremarkable. Pancreas: Unremarkable Spleen: Unremarkable Adrenals/Urinary Tract: Foley catheter in the otherwise empty urinary bladder. No hydronephrosis or hydroureter. Normal renal contours. No renal calculi observed. Stomach/Bowel: A nasogastric tube terminates in the stomach body. There less distention of the stomach than on the prior exam. Increased thickening of the gastric wall probably related to the lesser degree of distension compared to previous. A rectal tube is in place and contrast was administered via the rectal tube. 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 The rectum is fairly distended (possibly due to contrast filling), there is transition to smaller caliber at the rectosigmoid junction which partially fills with contrast medium for example on image 54 series 2. There is formed stool in the sigmoid colon along with various diverticular compatible with diverticulosis. The amount of formed stool in the colon is pronounced compatible with constipation and there is  likely some redundancy of the sigmoid colon which is otherwise difficult to follow due to the paucity of intra-adipose tissue in this patient which makes separation of bowel loops difficult. The transverse colon does course down into the left lower quadrant and then back up towards the spleen, compatible with redundant transverse colon. No overtly dilated small bowel loops are identified. There is stranding in the perirectal space for example on image 65 of series 2 which may indicate inflammation or third spacing of fluid, some of this was also present on 06/24/2021. There is no extraluminal gas or substantial perirectal hematoma identified. Soft tissue density anterior to the rectum is compatible with uterine tissues. Vascular/Lymphatic: Aortoiliac atherosclerotic vascular disease. Reproductive: Uterus visualized although the contour is fairly indistinct. Other: There is potentially low-level mesenteric edema as well. Edema tracks into the left upper thigh region anteriorly and along the pubis. Musculoskeletal: There is a transverse fracture of the lower sternum on image 67 series 6. Age indeterminate nondisplaced rib fractures of the left fifth through tenth ribs. Stable prominent compression fracture at T10 and stable mild endplate compression fractures at L2 and along the superior endplate of L3. Stable mild superior endplate compression fracture at L4. IMPRESSION: 1. Interval decompression of the stomach with a nasogastric tube. 2. Rectal contrast was administered and L lined some anterior rectal irregularity probably corresponding to the biopsy tumor. 3. Prominent stool throughout the colon favors constipation. Redundant sigmoid colon and redundant transverse colon. 4. There is some increased presacral and perirectal stranding although this may be part of the or widespread third spacing of fluid process with small bilateral pleural effusions, bilateral subcutaneous edema, and mild mesenteric edema. 5. No  findings of extraluminal gas or obvious abscess. Paucity of intra-adipose tissue makes separating adjacent bowel loops difficult. 6. Bibasilar atelectasis. 7. Subacute sternal fractures and subacute fractures of the  left fifth through tenth ribs. 8. Prior compression fractures at T10, L2, L3, and L4. 9.  Aortic Atherosclerosis (ICD10-I70.0). Electronically Signed   By: Van Clines M.D.   On: 06/30/2021 14:57   CT ABDOMEN PELVIS W CONTRAST  Result Date: 06/24/2021 CLINICAL DATA:  Abdominal distension EXAM: CT ABDOMEN AND PELVIS WITH CONTRAST TECHNIQUE: Multidetector CT imaging of the abdomen and pelvis was performed using the standard protocol following bolus administration of intravenous contrast. CONTRAST:  64m OMNIPAQUE IOHEXOL 350 MG/ML SOLN COMPARISON:  CT 01/28/2019 FINDINGS: Lower chest: Bibasilar atelectasis. Heart size is mildly enlarged. Coronary artery calcifications are seen. Hepatobiliary: No focal liver abnormality is seen. No gallstones, gallbladder wall thickening, or biliary dilatation. Pancreas: Mildly atrophic, but appears grossly unremarkable. Spleen: Normal in size without focal abnormality. Adrenals/Urinary Tract: Unremarkable adrenal glands. Kidneys enhance symmetrically without focal lesion, stone, or hydronephrosis. Ureters are nondilated. Urinary bladder appears unremarkable. Stomach/Bowel: Markedly dilated stomach with fluid and air. No evidence of mechanical outlet obstruction. There is a moderate-sized rectal stool ball. Short segment area of narrowing at the rectosigmoid junction with a moderately dilated distal sigmoid colon. Moderate volume of stool throughout the remaining colon. What appears to be a normal appendix is seen in the right lower quadrant (series 4, image 51). No dilated loops of small bowel are seen within the abdomen. Vascular/Lymphatic: Atherosclerotic calcifications are present throughout the aortoiliac axis. No abdominopelvic lymphadenopathy.  Reproductive: Uterus and bilateral adnexa are unremarkable. Other: No free fluid. No abdominopelvic fluid collection. No pneumoperitoneum. No abdominal wall hernia. Musculoskeletal: Diffuse osseous demineralization. Vertebra plana deformity of the T10 vertebral body, progressed from 2020. Mild chronic L2 compression deformity. IMPRESSION: 1. Markedly dilated stomach with fluid and air. No evidence of mechanical outlet obstruction. 2. Short segment area of narrowing at the rectosigmoid junction with a moderately dilated distal sigmoid colon. Findings may represent a focal area of peristalsis, although a colonic mass is not entirely excluded. A follow-up colonoscopy could be considered. 3. Moderate-sized rectal stool ball. 4. Vertebra plana deformity of the T10 vertebral body, progressed from 2020. Mild chronic L2 compression deformity. 5. Aortic atherosclerosis (ICD10-I70.0). Electronically Signed   By: NDavina PokeD.O.   On: 06/24/2021 20:07   DG CHEST PORT 1 VIEW  Result Date: 06/26/2021 CLINICAL DATA:  NG tube placement EXAM: PORTABLE CHEST 1 VIEW COMPARISON:  None. FINDINGS: NG tube with tip in the gastric body and side port below the GE junction. Low lung volumes.  Cardiac stent noted. IMPRESSION: NG tube with tip in stomach. Electronically Signed   By: SSuzy BouchardM.D.   On: 06/26/2021 09:17   DG CHEST PORT 1 VIEW  Result Date: 06/25/2021 CLINICAL DATA:  Cough, NG tube placement EXAM: PORTABLE CHEST 1 VIEW COMPARISON:  01/29/2019 FINDINGS: Lungs are essentially clear.  No pleural effusion or pneumothorax. The heart is normal in size.  Coronary stent. Enteric tube courses into the mid stomach. IMPRESSION: Enteric tube courses into the mid stomach. Electronically Signed   By: SJulian HyM.D.   On: 06/25/2021 02:44   DG ABD ACUTE 2+V W 1V CHEST  Result Date: 06/27/2021 CLINICAL DATA:  Abdominal pain and gastric distension. Patient with severe pain throughout. Hx of HTN and MI. Negative  covid-19 test x 3 days ago. EXAM: DG ABDOMEN ACUTE WITH 1 VIEW CHEST COMPARISON:  the previous day's study FINDINGS: Low lung volumes with patchy subsegmental atelectasis at the lung bases. Heart size upper limits normal for technique. Aortic Atherosclerosis (ICD10-170.0). Coronary stent. Blunting of  the left lateral costophrenic angle suggesting small effusion. No pneumothorax. No free air. Normal bowel gas pattern. Nasogastric tube extends into the decompressed stomach. Urinary bladder is distended. Regional bones unremarkable. IMPRESSION: 1. Distended urinary bladder. 2. Low volumes with patchy subsegmental atelectasis at the lung bases, possible small left effusion. 3. For nasogastric tube to the decompressed stomach. Electronically Signed   By: Lucrezia Europe M.D.   On: 06/27/2021 07:29   DG ABD ACUTE 2+V W 1V CHEST  Result Date: 06/26/2021 CLINICAL DATA:  87 year old female with abdominal pain. EXAM: DG ABDOMEN ACUTE WITH 1 VIEW CHEST COMPARISON:  CT Abdomen and Pelvis 06/24/2021 and earlier. FINDINGS: Portable upright and supine views at 0442 hours. Enteric tube has been placed into the stomach, with evidence of resolved gastric distension since the recent CT. Mildly distended urinary bladder with excreted IV contrast. Continued low lung volumes. No pneumothorax or pneumoperitoneum identified. Increased hypo ventilation at the left lung base. Stable cardiac size and mediastinal contours. Gas-filled bowel loops elsewhere in the abdomen and pelvis appear stable from the recent CT. Osteopenia. No acute osseous abnormality identified. IMPRESSION: 1. Enteric tube placed into the stomach with evidence of gastric decompression since the recent CT. 2. Otherwise stable bowel gas pattern.  No pneumoperitoneum. 3. Interval decreased ventilation at the left lung base, perhaps atelectasis. Electronically Signed   By: Genevie Ann M.D.   On: 06/26/2021 05:25   DG Abdomen Acute W/Chest  Result Date: 06/24/2021 CLINICAL DATA:   Abdominal pain and distension. EXAM: DG ABDOMEN ACUTE WITH 1 VIEW CHEST COMPARISON:  CT 01/28/2019 FINDINGS: Anti lordotic positioning. Low lung volumes. Borderline cardiomegaly with coronary stent. No acute airspace disease or pleural effusion. No pneumothorax. No free intra-abdominal air. Marked gaseous gastric distension, similar gastric distension seen on prior exam when the stomach is fluid-filled. Large volume of colonic stool. There is also likely gaseous distension of transverse colon, although air-filled structure coursing across the mid abdomen may be distended stomach. No obvious small bowel dilatation. Prominent vascular calcifications are seen. The bones are diffusely under mineralized. No obvious acute osseous abnormality. Surgical clips in the right breast. IMPRESSION: 1. Marked gaseous gastric distension. The stomach was previously prominently distended with fluid. Air-filled structure coursing transversely across the mid abdomen may be gaseous distension of transverse colon or gastric distension. 2. Large volume of colonic stool. 3. Low lung volumes without acute abnormality in the chest. Electronically Signed   By: Keith Rake M.D.   On: 06/24/2021 17:59     CBC Recent Labs  Lab 06/24/21 1711 06/25/21 0455 06/26/21 0554 06/27/21 2208 06/28/21 0329 06/28/21 1151 06/29/21 0408  WBC 11.1* 10.4 8.1  --  6.0  --  4.5  HGB 14.1 12.2 11.6* 12.4 11.3* 12.1 11.5*  HCT 40.3 33.8* 33.6* 34.8* 31.7* 35.2* 33.6*  PLT 198 170 156  --  154  --  165  MCV 96.0 94.7 97.7  --  96.4  --  95.5  MCH 33.6 34.2* 33.7  --  34.3*  --  32.7  MCHC 35.0 36.1* 34.5  --  35.6  --  34.2  RDW 13.3 13.2 13.7  --  13.8  --  13.9  LYMPHSABS 1.0 0.8  --   --   --   --   --   MONOABS 0.6 0.7  --   --   --   --   --   EOSABS 0.1 0.0  --   --   --   --   --  BASOSABS 0.0 0.0  --   --   --   --   --     Chemistries  Recent Labs  Lab 06/24/21 1711 06/25/21 0455 06/26/21 0554 06/28/21 0329  06/29/21 0408 06/30/21 0538 07/01/21 0458  NA 124* 128* 129* 133* 137 139 138  K 4.1 3.0* 3.7 3.7 4.3 3.9 4.0  CL 85* 97* 102 106 109 112* 111  CO2 27 23 21* '22 23 23 22  '$ GLUCOSE 188* 100* 164* 131* 201* 124* 133*  BUN 27* 32* '18 11 10 8 9  '$ CREATININE 1.25* 0.85 0.74 0.74 0.65 0.70 0.55  CALCIUM 8.6* 8.0* 7.8* 7.8* 8.3* 7.8* 8.1*  MG  --  1.7  --   --   --   --   --   AST '28 23 23  '$ --   --   --   --   ALT '19 16 14  '$ --   --   --   --   ALKPHOS 110 89 78  --   --   --   --   BILITOT 1.0 1.1 0.6  --   --   --   --    ------------------------------------------------------------------------------------------------------------------ No results for input(s): CHOL, HDL, LDLCALC, TRIG, CHOLHDL, LDLDIRECT in the last 72 hours.  Lab Results  Component Value Date   HGBA1C 5.0 11/25/2013   ------------------------------------------------------------------------------------------------------------------ No results for input(s): TSH, T4TOTAL, T3FREE, THYROIDAB in the last 72 hours.  Invalid input(s): FREET3 ------------------------------------------------------------------------------------------------------------------ No results for input(s): VITAMINB12, FOLATE, FERRITIN, TIBC, IRON, RETICCTPCT in the last 72 hours.  Coagulation profile Recent Labs  Lab 06/24/21 1832 06/25/21 0455  INR 1.2 1.2    No results for input(s): DDIMER in the last 72 hours.  Cardiac Enzymes No results for input(s): CKMB, TROPONINI, MYOGLOBIN in the last 168 hours.  Invalid input(s): CK ------------------------------------------------------------------------------------------------------------------ No results found for: BNP   Deatra James M.D on 07/01/2021 at 12:30 PM  Go to [email or web address] - for contact info  [facility] - Office  [phone]

## 2021-07-01 NOTE — Progress Notes (Addendum)
Subjective: NG tube is clamped.  Per nursing staff, this has been clamped all night.  No fluid in canister.  Patient denies abdominal pain, nausea, or vomiting.  States she is tolerating her diet, but she is really only consuming minimal amounts.  All she had was a little orange juice this morning.  Per nursing staff, reports gagging/dry heaving after taking MiraLAX and medications this morning.  When laying patient down to about 45 degrees, she states she couldn't breathe and started coughing slightly. Raised head of bed back up. Coughing stopped. Reports slight shortness of breath.   Discussed possibility of colonoscopy.  Patient states she is not sure if she is ready to pursue this.  2 documented BMs in the last 24 hours.   Objective: Vital signs in last 24 hours: Temp:  [98 F (36.7 C)-98.3 F (36.8 C)] 98.3 F (36.8 C) (08/19 0536) Pulse Rate:  [75-91] 82 (08/19 0536) Resp:  [18-20] 20 (08/19 0536) BP: (135-174)/(88-114) 154/91 (08/19 0536) SpO2:  [96 %-99 %] 98 % (08/19 0536) Last BM Date: 06/30/21 General:   Alert and oriented, pleasant, NAD.  Head:  Normocephalic and atraumatic. Eyes:  No icterus, sclera clear. Conjuctiva pink.  Lungs: Clear to auscultation bilaterally though some coursed/abnormal sounds in left lower lung fields. No crackles, rhonchi, or wheezing.   Abdomen:  Bowel sounds present. Abdomen remains distended but is soft and nontender to palpation.  No rebound or guarding. No masses appreciated  Msk:  Symmetrical without gross deformities. Normal posture. Extremities:  Without edema.  SCDs in place. Neurologic:  Alert and  oriented x4;  grossly normal neurologically.Marland Kitchen Psych:  Normal mood and affect.  Intake/Output from previous day: 08/18 0701 - 08/19 0700 In: 2777.2 [P.O.:600; I.V.:2177.2] Out: 950 [Urine:350; Emesis/NG output:600] Intake/Output this shift: No intake/output data recorded.  Lab Results: Recent Labs    06/28/21 1151 06/29/21 0408   WBC  --  4.5  HGB 12.1 11.5*  HCT 35.2* 33.6*  PLT  --  165   BMET Recent Labs    06/29/21 0408 06/30/21 0538 07/01/21 0458  NA 137 139 138  K 4.3 3.9 4.0  CL 109 112* 111  CO2 '23 23 22  '$ GLUCOSE 201* 124* 133*  BUN '10 8 9  '$ CREATININE 0.65 0.70 0.55  CALCIUM 8.3* 7.8* 8.1*   Studies/Results: CT ABDOMEN PELVIS WO CONTRAST  Result Date: 06/30/2021 CLINICAL DATA:  Abdominal distension, rectal mass on sigmoidoscopy performed yesterday. EXAM: CT ABDOMEN AND PELVIS WITHOUT CONTRAST TECHNIQUE: Multidetector CT imaging of the abdomen and pelvis was performed following the standard protocol without IV contrast. COMPARISON:  Multiple exams, including 06/24/2021 FINDINGS: Lower chest: Small bilateral pleural effusions with atelectasis in both lower lobes and mild atelectasis in the right middle lobe and lingula. Clips are noted along the upper glandular tissues of the right breast. Coronary atherosclerosis most notable in the left anterior descending coronary artery. Descending thoracic aortic atherosclerosis. Hepatobiliary: Poor visualization of the gallbladder. Otherwise unremarkable. Pancreas: Unremarkable Spleen: Unremarkable Adrenals/Urinary Tract: Foley catheter in the otherwise empty urinary bladder. No hydronephrosis or hydroureter. Normal renal contours. No renal calculi observed. Stomach/Bowel: A nasogastric tube terminates in the stomach body. There less distention of the stomach than on the prior exam. Increased thickening of the gastric wall probably related to the lesser degree of distension compared to previous. A rectal tube is in place and contrast was administered via the rectal tube. This outlines some irregular filling defects most of which probably represent mucus, although  an anterior filling defect in the rectum on image 67 of series 6 could represent the frond-like tumor that was biopsied. The rectum is fairly distended (possibly due to contrast filling), there is transition to  smaller caliber at the rectosigmoid junction which partially fills with contrast medium for example on image 54 series 2. There is formed stool in the sigmoid colon along with various diverticular compatible with diverticulosis. The amount of formed stool in the colon is pronounced compatible with constipation and there is likely some redundancy of the sigmoid colon which is otherwise difficult to follow due to the paucity of intra-adipose tissue in this patient which makes separation of bowel loops difficult. The transverse colon does course down into the left lower quadrant and then back up towards the spleen, compatible with redundant transverse colon. No overtly dilated small bowel loops are identified. There is stranding in the perirectal space for example on image 65 of series 2 which may indicate inflammation or third spacing of fluid, some of this was also present on 06/24/2021. There is no extraluminal gas or substantial perirectal hematoma identified. Soft tissue density anterior to the rectum is compatible with uterine tissues. Vascular/Lymphatic: Aortoiliac atherosclerotic vascular disease. Reproductive: Uterus visualized although the contour is fairly indistinct. Other: There is potentially low-level mesenteric edema as well. Edema tracks into the left upper thigh region anteriorly and along the pubis. Musculoskeletal: There is a transverse fracture of the lower sternum on image 67 series 6. Age indeterminate nondisplaced rib fractures of the left fifth through tenth ribs. Stable prominent compression fracture at T10 and stable mild endplate compression fractures at L2 and along the superior endplate of L3. Stable mild superior endplate compression fracture at L4. IMPRESSION: 1. Interval decompression of the stomach with a nasogastric tube. 2. Rectal contrast was administered and L lined some anterior rectal irregularity probably corresponding to the biopsy tumor. 3. Prominent stool throughout the colon  favors constipation. Redundant sigmoid colon and redundant transverse colon. 4. There is some increased presacral and perirectal stranding although this may be part of the or widespread third spacing of fluid process with small bilateral pleural effusions, bilateral subcutaneous edema, and mild mesenteric edema. 5. No findings of extraluminal gas or obvious abscess. Paucity of intra-adipose tissue makes separating adjacent bowel loops difficult. 6. Bibasilar atelectasis. 7. Subacute sternal fractures and subacute fractures of the left fifth through tenth ribs. 8. Prior compression fractures at T10, L2, L3, and L4. 9.  Aortic Atherosclerosis (ICD10-I70.0). Electronically Signed   By: Van Clines M.D.   On: 06/30/2021 14:57    Assessment: 87 year old female with history of CAD, prior MI, ischemic cardiomyopathy, hypertension presenting to [facility] ED August 12 with abdominal pain, distention, nausea/vomiting.  CT abdomen pelvis revealed markedly dilated stomach with fluid and air, short segment area of narrowing at the rectosigmoid junction with moderately dilated distal sigmoid, moderate sized rectal stool ball.  NG tube placed for decompression with improvement in abdominal distention and symptoms.  Fecal disimpaction by hospitalist at bedside on August 14.  EGD and flexible sigmoidoscopy August 16 with Dr. Jenetta Downer, revealed nonbleeding gastric ulcer, normal duodenum.  Large amount of solid stool in the sigmoid area, making visualization difficult, 8 mm polyp found in the sigmoid colon that was sessile, could not be removed due to stool presence.  Nonobstructing large mass found at 5 cm proximal to the anus with 2 localized areas of depression, mass was not circumferential and measured 1 cm in length, measuring 30 mm.  No  bleeding present.  Gastric biopsy revealed nonspecific reactive gastropathy, negative for H. pylori.  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  CT abdomen pelvis with rectal contrast 8/19 showing improving gastric distention with NG tube in place, some anterior rectal irregularity probably corresponding to biopsy of tumor, prominent stool throughout the colon, redundant sigmoid colon and redundant transverse colon, no overtly dilated small bowel loops, subacute sternal fractures and subacute fractures of the left fifth through 10th ribs.  Some increased presacral and perirectal stranding possibly due to third spacing.   Clinically, patient seems to have slight improvement this admission. Reportedly had been tolerating full liquids, now on clear liquids due to consideration of need for complete colonoscopy. 2 documented Bms in last 24 hours. NG tube has been clamped overnight with no nausea/vomiting, or abdominal pain. However, when I laid patient back to about 45 degrees, she states she couldn't breathe and started coughing slightly.  Nurse also tells me patient had gagging after taking MiraLAX and pills this morning.  She may have increased retained contents/gastric distension since NG tube has been clamped. She may need something to help with GI motility. Discussed the possibility of a colonoscopy with patient today. She states she is not sure if she is ready for this and would like to think about it a little longer. Feels she needs to rest for another day. I will discuss with Dr. Jenetta Downer on role of colonoscopy as biopsies did not reveal malignancy.   Shortness of breath: Patient reports mild SOB today. Clear to auscultation bilaterally though some coursed/abnormal sounds in left lower lung fields. No crackles, rhonchi, or wheezing.  Will obtain DG Chest.   Plan: Will discuss role/timing of colonoscopy with Dr. Jenetta Downer. Continue clear liquid diet for now as tolerated. Continue bowel regimen with MiraLAX 17 g 3 times daily and Senokot twice daily. Continue PPI twice daily. May need  something to help with GI motility.  DG Chest today.    LOS: 7 days    07/01/2021, 11:20 AM   Aliene Altes, PA-C [facility]

## 2021-07-01 NOTE — Care Management Important Message (Signed)
Important Message  Patient Details  Name: Debra Doyle MRN: [id number] Date of Birth: 18-Nov-1928   Medicare Important Message Given:  Yes     Tommy Medal 07/01/2021, 4:05 PM

## 2021-07-01 NOTE — Progress Notes (Addendum)
[facility] Progress Note  3 Days Post-Op  Subjective: NG clamped overnight and no nausea vomiting and on clears and having BM but she is very distended in the gastric region and looks full and uncomfortable in the bed.   CT with rectal contrast with decreased caliber at the rectosigmoid but I think this could be from just a dilated rectum and the rectosigmoid is normal. Mass with no cancer on biopsy and photos do not look overly suspicious.   Objective: Vital signs in last 24 hours: Temp:  [98 F (36.7 C)-98.3 F (36.8 C)] 98.3 F (36.8 C) (08/19 0536) Pulse Rate:  [75-91] 82 (08/19 0536) Resp:  [18-20] 20 (08/19 0536) BP: (135-174)/(88-114) 154/91 (08/19 0536) SpO2:  [96 %-99 %] 98 % (08/19 0536) Last BM Date: 06/30/21  Intake/Output from previous day: 08/18 0701 - 08/19 0700 In: 2777.2 [P.O.:600; I.V.:2177.2] Out: 950 [Urine:350; Emesis/NG output:600] Intake/Output this shift: No intake/output data recorded.  General appearance: alert and no distress Resp: shallow breaths GI: soft, gastric distention  Lab Results:  Recent Labs    06/29/21 0408  WBC 4.5  HGB 11.5*  HCT 33.6*  PLT 165   BMET Recent Labs    06/30/21 0538 07/01/21 0458  NA 139 138  K 3.9 4.0  CL 112* 111  CO2 23 22  GLUCOSE 124* 133*  BUN 8 9  CREATININE 0.70 0.55  CALCIUM 7.8* 8.1*   PT/INR No results for input(s): LABPROT, INR in the last 72 hours.  Studies/Results: CT ABDOMEN PELVIS WO CONTRAST  Result Date: 06/30/2021 CLINICAL DATA:  Abdominal distension, rectal mass on sigmoidoscopy performed yesterday. EXAM: CT ABDOMEN AND PELVIS WITHOUT CONTRAST TECHNIQUE: Multidetector CT imaging of the abdomen and pelvis was performed following the standard protocol without IV contrast. COMPARISON:  Multiple exams, including 06/24/2021 FINDINGS: Lower chest: Small bilateral pleural effusions with atelectasis in both lower lobes and mild atelectasis in the right middle lobe and  lingula. Clips are noted along the upper glandular tissues of the right breast. Coronary atherosclerosis most notable in the left anterior descending coronary artery. Descending thoracic aortic atherosclerosis. Hepatobiliary: Poor visualization of the gallbladder. Otherwise unremarkable. Pancreas: Unremarkable Spleen: Unremarkable Adrenals/Urinary Tract: Foley catheter in the otherwise empty urinary bladder. No hydronephrosis or hydroureter. Normal renal contours. No renal calculi observed. Stomach/Bowel: A nasogastric tube terminates in the stomach body. There less distention of the stomach than on the prior exam. Increased thickening of the gastric wall probably related to the lesser degree of distension compared to previous. A rectal tube is in place and contrast was administered via the rectal tube. 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 The rectum is fairly distended (possibly due to contrast filling), there is transition to smaller caliber at the rectosigmoid junction which partially fills with contrast medium for example on image 54 series 2. There is formed stool in the sigmoid colon along with various diverticular compatible with diverticulosis. The amount of formed stool in the colon is pronounced compatible with constipation and there is likely some redundancy of the sigmoid colon which is otherwise difficult to follow due to the paucity of intra-adipose tissue in this patient which makes separation of bowel loops difficult. The transverse colon does course down into the left lower quadrant and then back up towards the spleen, compatible with redundant transverse colon. No overtly dilated  small bowel loops are identified. There is stranding in the perirectal space for example on image 65 of series 2 which may indicate inflammation  or third spacing of fluid, some of this was also present on 06/24/2021. There is no extraluminal gas or substantial perirectal hematoma identified. Soft tissue density anterior to the rectum is compatible with uterine tissues. Vascular/Lymphatic: Aortoiliac atherosclerotic vascular disease. Reproductive: Uterus visualized although the contour is fairly indistinct. Other: There is potentially low-level mesenteric edema as well. Edema tracks into the left upper thigh region anteriorly and along the pubis. Musculoskeletal: There is a transverse fracture of the lower sternum on image 67 series 6. Age indeterminate nondisplaced rib fractures of the left fifth through tenth ribs. Stable prominent compression fracture at T10 and stable mild endplate compression fractures at L2 and along the superior endplate of L3. Stable mild superior endplate compression fracture at L4. IMPRESSION: 1. Interval decompression of the stomach with a nasogastric tube. 2. Rectal contrast was administered and L lined some anterior rectal irregularity probably corresponding to the biopsy tumor. 3. Prominent stool throughout the colon favors constipation. Redundant sigmoid colon and redundant transverse colon. 4. There is some increased presacral and perirectal stranding although this may be part of the or widespread third spacing of fluid process with small bilateral pleural effusions, bilateral subcutaneous edema, and mild mesenteric edema. 5. No findings of extraluminal gas or obvious abscess. Paucity of intra-adipose tissue makes separating adjacent bowel loops difficult. 6. Bibasilar atelectasis. 7. Subacute sternal fractures and subacute fractures of the left fifth through tenth ribs. 8. Prior compression fractures at T10, L2, L3, and L4. 9.  Aortic Atherosclerosis (ICD10-I70.0). Electronically Signed   By: Van Clines M.D.   On: 06/30/2021 14:57    Anti-infectives: Anti-infectives (From admission, onward)    Start     Dose/Rate  Route Frequency Ordered Stop   06/25/21 1800  ceFEPIme (MAXIPIME) 2 g in sodium chloride 0.9 % 100 mL IVPB  Status:  Discontinued        2 g 200 mL/hr over 30 Minutes Intravenous Every 24 hours 06/24/21 1851 06/27/21 1255   06/25/21 1800  vancomycin (VANCOREADY) IVPB 500 mg/100 mL        500 mg 100 mL/hr over 60 Minutes Intravenous Every 24 hours 06/24/21 1856 06/25/21 2359   06/24/21 1830  ceFEPIme (MAXIPIME) 2 g in sodium chloride 0.9 % 100 mL IVPB        2 g 200 mL/hr over 30 Minutes Intravenous  Once 06/24/21 1829 06/24/21 2118   06/24/21 1830  metroNIDAZOLE (FLAGYL) IVPB 500 mg  Status:  Discontinued        500 mg 100 mL/hr over 60 Minutes Intravenous Every 8 hours 06/24/21 1829 06/27/21 1255   06/24/21 1830  vancomycin (VANCOCIN) IVPB 1000 mg/200 mL premix        1,000 mg 200 mL/hr over 60 Minutes Intravenous  Once 06/24/21 1829 06/24/21 2118       Assessment/Plan: Debra Doyle is a 87 yo with gastric distention, no obstruction, and no obvious narrowing or mass in th rectosigmoid and rectal mass that is polyp in nature after biopsy. I think biggest issue now is the gastric distention and trial of motility agent likely will help and can get NG out and adv diet.   Discussed with team.  KUB to confirm just gastric distention  Reglan 5 TID ordered, will have to monitor given her age   As far as completion colonoscopy, given the CT with rectal contrast,  Dr. Jenetta Downer getting above 30cm it think it is unlikely anything going on that would cause narrowing. Will need endoscopic removal of the rectal lesion and can get colonoscopy with that when appropriate.    LOS: 7 days    Debra Doyle 07/01/2021

## 2021-07-02 DIAGNOSIS — K567 Ileus, unspecified: Secondary | ICD-10-CM

## 2021-07-02 LAB — BASIC METABOLIC PANEL
Anion gap: 3 — ABNORMAL LOW (ref 5–15)
BUN: 6 mg/dL — ABNORMAL LOW (ref 8–23)
CO2: 23 mmol/L (ref 22–32)
Calcium: 8 mg/dL — ABNORMAL LOW (ref 8.9–10.3)
Chloride: 112 mmol/L — ABNORMAL HIGH (ref 98–111)
Creatinine, Ser: 0.67 mg/dL (ref 0.44–1.00)
GFR, Estimated: >60 mL/min (ref >60)
Glucose, Bld: 155 mg/dL — ABNORMAL HIGH (ref 70–99)
Potassium: 4.3 mmol/L (ref 3.5–5.1)
Sodium: 138 mmol/L (ref 135–145)

## 2021-07-02 LAB — GLUCOSE, CAPILLARY: Glucose-Capillary: 139 mg/dL — ABNORMAL HIGH (ref 70–99)

## 2021-07-02 MED ORDER — MORPHINE SULFATE (PF) 2 MG/ML IV SOLN: 2.0000 mg | INTRAVENOUS | Status: DC | PRN

## 2021-07-02 NOTE — Progress Notes (Signed)
L arm noted to be swollen at beginning of shift.  Arm below PIV was cold, but arm above PIV was warm.  There was a small amount of blood around the PIV insertion point. No redness noted.  There was blood return and saline could be felt flowing through the vein. At the end of my shift, L arm has increased in swelling.  There is still blood return and saline can be felt when flushing.  Because of the edema, I placed another PIV

## 2021-07-02 NOTE — Progress Notes (Signed)
[facility] Progress Note  4 Days Post-Op  Subjective: KUB with global ileus, started reglan, and is on clear diet already, having Bms. Is not obstructed. Looks uncomfortable slouched in bed but RN reports patient only wants to sit like that because of her back pain.   Objective: Vital signs in last 24 hours: Temp:  [98 F (36.7 C)-98.6 F (37 C)] 98.6 F (37 C) (08/20 0508) Pulse Rate:  [60-77] 76 (08/20 0924) Resp:  [17-18] 17 (08/20 0508) BP: (121-168)/(71-108) 168/108 (08/20 0924) SpO2:  [96 %-97 %] 96 % (08/20 0508) Last BM Date: 07/01/21  Intake/Output from previous day: 08/19 0701 - 08/20 0700 In: 1795.7 [I.V.:1795.7] Out: 500 [Urine:500] Intake/Output this shift: No intake/output data recorded.  General appearance: alert and no distress GI: distended, mildly tender  Lab Results:  No results for input(s): WBC, HGB, HCT, PLT in the last 72 hours. BMET Recent Labs    07/01/21 0458 07/02/21 0745  NA 138 138  K 4.0 4.3  CL 111 112*  CO2 22 23  GLUCOSE 133* 155*  BUN 9 6*  CREATININE 0.55 0.67  CALCIUM 8.1* 8.0*   PT/INR No results for input(s): LABPROT, INR in the last 72 hours.  Studies/Results: DG Abd 1 View  Result Date: 07/01/2021 CLINICAL DATA:  Abdominal distension and pain. EXAM: ABDOMEN - 1 VIEW COMPARISON:  Comparison with June 27, 2021. FINDINGS: Gastric tube remains in the stomach. Increasing distension of bowel loops. Evacuation of contrast was present in the rectum compared to previous imaging. EKG leads project throughout the abdomen. Osteopenia. No acute skeletal process on limited assessment. IMPRESSION: Slight increase in distension of bowel loops. Evacuation of contrast in the rectum compared to previous imaging. Findings likely related to global ileus. Attention on follow-up. Electronically Signed   By: Zetta Bills M.D.   On: 07/01/2021 14:51   DG CHEST PORT 1 VIEW  Result Date: 07/01/2021 CLINICAL DATA:  Shortness of  breath abdominal pain, negative COVID assessment on 06/25/2021. EXAM: PORTABLE CHEST 1 VIEW COMPARISON:  June 26, 2021 and CT of the abdomen and pelvis from June 30, 2021. FINDINGS: Gastric tube in place, tip likely in mid stomach, side port below the level of the GE junction in the proximal to mid stomach. EKG leads project over the chest. Cardiomediastinal contours are stable. There is further consolidative change at the LEFT lung base in the retrocardiac region obscuring LEFT hemidiaphragm. Changes of percutaneous coronary intervention along the LEFT heart border as before. No lobar consolidative changes.  No effusion on the RIGHT. Diffuse bowel distension in the upper abdomen is similar to prior imaging. No acute skeletal process on limited assessment. IMPRESSION: 1. Increasing consolidative change at the LEFT lung base in the retrocardiac region. May reflect developing pneumonia or volume loss with pleural effusion. 2. Gastric tube in place, tip likely in mid stomach. Electronically Signed   By: Zetta Bills M.D.   On: 07/01/2021 14:49    Anti-infectives: Anti-infectives (From admission, onward)    Start     Dose/Rate Route Frequency Ordered Stop   06/25/21 1800  ceFEPIme (MAXIPIME) 2 g in sodium chloride 0.9 % 100 mL IVPB  Status:  Discontinued        2 g 200 mL/hr over 30 Minutes Intravenous Every 24 hours 06/24/21 1851 06/27/21 1255   06/25/21 1800  vancomycin (VANCOREADY) IVPB 500 mg/100 mL        500 mg 100 mL/hr over 60 Minutes Intravenous Every 24 hours 06/24/21 1856  06/25/21 2359   06/24/21 1830  ceFEPIme (MAXIPIME) 2 g in sodium chloride 0.9 % 100 mL IVPB        2 g 200 mL/hr over 30 Minutes Intravenous  Once 06/24/21 1829 06/24/21 2118   06/24/21 1830  metroNIDAZOLE (FLAGYL) IVPB 500 mg  Status:  Discontinued        500 mg 100 mL/hr over 60 Minutes Intravenous Every 8 hours 06/24/21 1829 06/27/21 1255   06/24/21 1830  vancomycin (VANCOCIN) IVPB 1000 mg/200 mL premix         1,000 mg 200 mL/hr over 60 Minutes Intravenous  Once 06/24/21 1829 06/24/21 2118       Assessment/Plan: Ms. Mayeda is a 87 yo with global ileus, rectal polyp that can be removed endoscopically, no obvious narrowing or mass in rectosigmoid on CT with rectal contrast, who was started on Reglan yesterday. NG removed today. Would continue motility agent and monitor.   When/ if she finally gets the rectal polyp removed she can get attempt at completion colonoscopy but do not that it is urgent in nature. Her ileus and motility issues need to resolve.  No surgical intervention indicated at this time. Will be available if needed.    LOS: 8 days    Virl Cagey 07/02/2021

## 2021-07-02 NOTE — Progress Notes (Signed)
Debra Doyle, M.D. Gastroenterology & Hepatology   Interval History:  No acute events overnight. Patient states feeling well but reports that her abdomen is still distended in her upper abdomen.  Has been moving her bowels. She reports that she has not been ambulating yet as she needs a walker.  Not asking for any pain medications.  Still has NG tube placed but is clamped.  Inpatient Medications:  Current Facility-Administered Medications:    acetaminophen (TYLENOL) tablet 650 mg, 650 mg, Oral, Q6H PRN **OR** acetaminophen (TYLENOL) suppository 650 mg, 650 mg, Rectal, Q6H PRN, Zierle-Ghosh, Asia B, DO   amLODipine (NORVASC) tablet 5 mg, 5 mg, Oral, Daily, Shahmehdi, Seyed A, MD, 5 mg at 07/02/21 3546   benzonatate (TESSALON) capsule 200 mg, 200 mg, Oral, TID PRN, Zierle-Ghosh, Asia B, DO   carvedilol (COREG) tablet 12.5 mg, 12.5 mg, Oral, BID WC, Shahmehdi, Seyed A, MD, 12.5 mg at 07/02/21 5681   Chlorhexidine Gluconate Cloth 2 % PADS 6 each, 6 each, Topical, Daily, Shahmehdi, Seyed A, MD, 6 each at 07/02/21 0926   dextrose 5 % and 0.9 % NaCl with KCl 20 mEq/L infusion, , Intravenous, Continuous, Shahmehdi, Seyed A, MD, Last Rate: 50 mL/hr at 07/02/21 0934, Rate Change at 07/02/21 0934   hydrALAZINE (APRESOLINE) injection 10 mg, 10 mg, Intravenous, Q4H PRN, Shahmehdi, Seyed A, MD   lidocaine (LIDODERM) 5 % 1 patch, 1 patch, Transdermal, Q24H, Zierle-Ghosh, Asia B, DO, 1 patch at 07/01/21 2326   metoCLOPramide (REGLAN) injection 5 mg, 5 mg, Intravenous, Q8H, Virl Cagey, MD, 5 mg at 07/02/21 2751   morphine 2 MG/ML injection 2 mg, 2 mg, Intravenous, Q4H PRN, Shahmehdi, Seyed A, MD   ondansetron (ZOFRAN) tablet 4 mg, 4 mg, Oral, Q6H PRN **OR** ondansetron (ZOFRAN) injection 4 mg, 4 mg, Intravenous, Q6H PRN, Zierle-Ghosh, Asia B, DO   pantoprazole (PROTONIX) injection 40 mg, 40 mg, Intravenous, Q12H, Zierle-Ghosh, Asia B, DO, 40 mg at 07/02/21 0926   phenol (CHLORASEPTIC) mouth spray  1 spray, 1 spray, Mouth/Throat, PRN, Emokpae, Courage, MD   polyethylene glycol (MIRALAX / GLYCOLAX) packet 17 g, 17 g, Oral, TID, Mahala Menghini, PA-C, 17 g at 07/02/21 7001   senna-docusate (Senokot-S) tablet 2 tablet, 2 tablet, Oral, BID, Denton Brick, Courage, MD, 2 tablet at 07/02/21 7494   sodium phosphate (FLEET) 7-19 GM/118ML enema 1 enema, 1 enema, Rectal, Once, Eloise Harman, DO   I/O    Intake/Output Summary (Last 24 hours) at 07/02/2021 1219 Last data filed at 07/02/2021 0543 Gross per 24 hour  Intake 1420.74 ml  Output 250 ml  Net 1170.74 ml     Physical Exam: Temp:  [98 F (36.7 C)-98.6 F (37 C)] 98.6 F (37 C) (08/20 0508) Pulse Rate:  [60-77] 76 (08/20 0924) Resp:  [17-18] 17 (08/20 0508) BP: (121-168)/(71-108) 168/108 (08/20 0924) SpO2:  [96 %-97 %] 96 % (08/20 0508)  Temp (24hrs), Avg:98.3 F (36.8 C), Min:98 F (36.7 C), Max:98.6 F (37 C) GENERAL: The patient is AO x3, in no acute distress. Elder and frail. NGT clamped. HEENT: Head is normocephalic and atraumatic. EOMI are intact. Mouth is well hydrated and without lesions. NECK: Supple. No masses LUNGS: Clear to auscultation. No presence of rhonchi/wheezing/rales. Adequate chest expansion HEART: RRR, normal s1 and s2. ABDOMEN: nontender, no guarding, no peritoneal signs. Has presence of distention diffusely in upper abdomen. BS + but diminished. No masses. EXTREMITIES: Without any cyanosis, clubbing, rash, lesions or edema. NEUROLOGIC: AOx3, no focal motor deficit. SKIN:  no jaundice, no rashes  Laboratory Data: CBC:     Component Value Date/Time   WBC 4.5 06/29/2021 0408   RBC 3.52 (L) 06/29/2021 0408   HGB 11.5 (L) 06/29/2021 0408   HCT 33.6 (L) 06/29/2021 0408   PLT 165 06/29/2021 0408   MCV 95.5 06/29/2021 0408   MCH 32.7 06/29/2021 0408   MCHC 34.2 06/29/2021 0408   RDW 13.9 06/29/2021 0408   LYMPHSABS 0.8 06/25/2021 0455   MONOABS 0.7 06/25/2021 0455   EOSABS 0.0 06/25/2021 0455    BASOSABS 0.0 06/25/2021 0455   COAG:  Lab Results  Component Value Date   INR 1.2 06/25/2021   INR 1.2 06/24/2021   INR 1.4 (H) 01/28/2019    BMP:  BMP Latest Ref Rng & Units 07/02/2021 07/01/2021 06/30/2021  Glucose 70 - 99 mg/dL 155(H) 133(H) 124(H)  BUN 8 - 23 mg/dL 6(L) 9 8  Creatinine 0.44 - 1.00 mg/dL 0.67 0.55 0.70  Sodium 135 - 145 mmol/L 138 138 139  Potassium 3.5 - 5.1 mmol/L 4.3 4.0 3.9  Chloride 98 - 111 mmol/L 112(H) 111 112(H)  CO2 22 - 32 mmol/L 23 22 23   Calcium 8.9 - 10.3 mg/dL 8.0(L) 8.1(L) 7.8(L)    HEPATIC:  Hepatic Function Latest Ref Rng & Units 06/26/2021 06/25/2021 06/24/2021  Total Protein 6.5 - 8.1 g/dL 5.3(L) 5.6(L) 6.5  Albumin 3.5 - 5.0 g/dL 2.6(L) 2.9(L) 3.4(L)  AST 15 - 41 U/L 23 23 28   ALT 0 - 44 U/L 14 16 19   Alk Phosphatase 38 - 126 U/L 78 89 110  Total Bilirubin 0.3 - 1.2 mg/dL 0.6 1.1 1.0  Bilirubin, Direct 0.0 - 0.3 mg/dL - - -    CARDIAC:  Lab Results  Component Value Date   CKTOTAL 820 (H) 12/10/2010   CKMB (HH) 12/10/2010    97.4 CRITICAL VALUE NOTED.  VALUE IS CONSISTENT WITH PREVIOUSLY REPORTED AND CALLED VALUE.   TROPONINI >20.00 ([place]) 11/26/2013      Imaging: I personally reviewed and interpreted the available labs, imaging and endoscopic files.   Assessment/Plan: 87 year old female with multiple comorbidities including coronary artery disease status post PCI with stent placement, prior myocardial infarction, ischemic cardiomyopathy, hypertension, who was admitted to the hospital after presenting worsening abdominal pain and distention, nausea and vomiting.  There was presence of large distention of the gastric chamber and questionable narrowing at the rectosigmoid junction with presence of a rectal stool ball.  She had fecal disimpaction performed but without any improvement so an NG tube was placed and drainage has been minimal recently.  Due to the concern for possible partial obstruction, an EGD and flexible sigmoidoscopy were  performed that showed a nonbleeding gastric ulcer with normal histology negative for H. pylori and normal duodenum without presence of any obstructive lesions.  It was noted in her flexible sigmoidoscopy that she had large amount of stool but no sigmoid alterations, however there was presence of a large rectal mass which was TV adenoma. No obstructive lesions have been found.  Overall, the patient has presented clinical picture that is more consistent with a generalized dysmotility in her gastrointestinal system, likely worsened by her poor mobility.  Reglan trial was started yesterday and she has been on bowel regimen with some bowel movements but still has some gas distention.  I emphasized again to the nursing staff the need to mobilize her with a walker so she can have some progress in her gas mobilization.  We will also advance her to  clear liquid diet which was discussed with general surgery, NG tube can be removed.  - Advance to [facility]D - Remove NGT - Frequent mobilization with walker - Miralax TID - Minimize opiate or constipating medications - Patient and son will discuss potential outpatient colonoscopy after they have thoroughly discussed benefits vs risks given her age and comorbidities, as well as life expectancy  Debra Peppers, MD Gastroenterology and Hepatology [facility]

## 2021-07-02 NOTE — Progress Notes (Signed)
Patient Demographics:    Debra Doyle, is a 87 y.o. female, DOB - 1929/09/17, [id number]  Admit date - 06/24/2021   Admitting Physician [name], MD  Outpatient Primary MD for the patient is [name], MD  LOS - 8  CC: Abdominal pain,   Subjective:    [name] was seen and examined this morning, NG tube still in place, but no residual Hemodynamically stable, not very motivated to move out of the bed..   Stating she does not want to go to any rehab facility, she is living at home alone independently  KUB consistent ileus Nursing staff reported 3 stool overnight.  On clear liquid diet No signs of obstruction    Assessment  & Plan :    Principal Problem:   Gastric distention Active Problems:   AKI (acute kidney injury) (HCC)   Hyponatremia   Mild protein-calorie malnutrition (HCC)   Hypotension   Abdominal distension   Rectal mass   Constipation   Shortness of breath   Ileus, unspecified (HCC)   Brief Summary:- 86 y.o. female, with history of coronary artery disease/Prior MI/ischemic cardiomyopathy and HTN--admitted on 06/25/2021 with abdominal pain nausea and vomiting and found to have significant gastric distention on CT abdomen and pelvis   Assessment/plan:   1)Abdominal pain with Nausea and Vomiting -abdominal distention -NG tube still in place..  GI surgery agreed to remove NG tube -KUB consistent with ileus passing gas and has had BMs, no signs of obstruction -Was started on Reglan yesterday - - Gasteric Ulcer/5 cm anal mass - significant gastric distention on CT abdomen and Pelvis.  Revealing no retrosigmoid obstruction with rectal mass -Continue NG tube----(NG tube replaced on 06/26/21 -07/02/2021 -c/n IV fluids and PPI -Continue MiraLAX, ambulation, following imaging, as needed Reglan  -No surgical intervention, she can proceed with colonoscopy once rectal  polyp was removed per general surgery - per GI colonoscopy as an outpatient   5 cm anal mass - EGD and colonoscopy on EGD and flex sig- - EGD: Nonbleeding Gastric ulcer, --Flex sig shows lower: anal mass 5 cm from anal verge --- status postbiopsy pending pathology  -Biopsy results --Path report -benign nonmalignant mass   2)Short segment area of Narrowing  S/p EGD and flex sig on 8/16/2022g at the Rectosigmoid junction with a moderately dilated distal sigmoid colon--- rectal stool ball noted on CT abdomen and pelvis as well  -At this point profound stricture is ruled out with CT scan with contrast-  -Malignancy of the mass has been ruled out -GI recommending colonoscopy as an outpatient in the future   -Continue laxatives --- Surgery following peripherally    3)Dehydration/AKI/HypoMagnesiemia/HypoKalemia/HypoNatremia-- -Due to poor p.o. intake, nausea vomiting-improved with treatment electrolytes and IV fluids - Na 124 >>>137, 139 -Improving   4)Transiet Hypotension- -Resolved, sepsis ruled out AM Cortisol is 26.2, with no leukocytosis PCT is 0.23 Lactic acid 2.8 >>1.5 (lactic acidosis may be due to dehydration and poor tissue perfusion) -Discontinued vancomycin, cefepime and Flagyl   5) Hypertension -Increasing Coreg 12.5 mg p.o. twice daily, adding Norvasc 5 mg daily -Monitoring and titrating medication accordingly -Remained stable   Disposition/Need for in-Hospital Stay- patient unable to be discharged at this time due to  -requiring IV fluids until able  to tolerate oral intake, likely have a stricture due to rectal mass, needing further evaluation from GI and possibly surgery    Ethics -DNR/DNI Prognosis poor due to age, multiple comorbidities, decreased mobility decreased p.o. intake continue GI issues Discussed with family, his son on 07/01/2021   Status is: Inpatient  Remains inpatient appropriate because: Please see disposition above  Disposition: The  patient is from: Home              Anticipated d/c is to: Home with [facility]              Anticipated d/c date is: 2 days              Patient currently is not medically stable to d/c. Barriers: Not Clinically Stable-   Code Status :  -  Code Status: DNR   Family Communication:   (patient is alert, awake and coherent)  Son [name] at [phone] Consults  :  Gi  DVT Prophylaxis  :   - SCDs   Place and maintain sequential compression device Start: 06/27/21 2139 SCDs Start: 06/24/21 2330    Lab Results  Component Value Date   PLT 165 06/29/2021    Inpatient Medications  Scheduled Meds:  amLODipine  5 mg Oral Daily   carvedilol  12.5 mg Oral BID WC   Chlorhexidine Gluconate Cloth  6 each Topical Daily   lidocaine  1 patch Transdermal Q24H   metoCLOPramide (REGLAN) injection  5 mg Intravenous Q8H   pantoprazole (PROTONIX) IV  40 mg Intravenous Q12H   polyethylene glycol  17 g Oral TID   senna-docusate  2 tablet Oral BID   sodium phosphate  1 enema Rectal Once   Continuous Infusions:  dextrose 5 % and 0.9 % NaCl with KCl 20 mEq/L 50 mL/hr at 07/02/21 1337   PRN Meds:.acetaminophen **OR** acetaminophen, benzonatate, hydrALAZINE, morphine injection, ondansetron **OR** ondansetron (ZOFRAN) IV, phenol    Anti-infectives (From admission, onward)    Start     Dose/Rate Route Frequency Ordered Stop   06/25/21 1800  ceFEPIme (MAXIPIME) 2 g in sodium chloride 0.9 % 100 mL IVPB  Status:  Discontinued        2 g 200 mL/hr over 30 Minutes Intravenous Every 24 hours 06/24/21 1851 06/27/21 1255   06/25/21 1800  vancomycin (VANCOREADY) IVPB 500 mg/100 mL        500 mg 100 mL/hr over 60 Minutes Intravenous Every 24 hours 06/24/21 1856 06/25/21 2359   06/24/21 1830  ceFEPIme (MAXIPIME) 2 g in sodium chloride 0.9 % 100 mL IVPB        2 g 200 mL/hr over 30 Minutes Intravenous  Once 06/24/21 1829 06/24/21 2118   06/24/21 1830  metroNIDAZOLE (FLAGYL) IVPB 500 mg  Status:   Discontinued        500 mg 100 mL/hr over 60 Minutes Intravenous Every 8 hours 06/24/21 1829 06/27/21 1255   06/24/21 1830  vancomycin (VANCOCIN) IVPB 1000 mg/200 mL premix        1,000 mg 200 mL/hr over 60 Minutes Intravenous  Once 06/24/21 1829 06/24/21 2118         Objective:   Vitals:   07/01/21 1338 07/01/21 2117 07/02/21 0508 07/02/21 0924  BP: (!) 154/92 121/71 (!) 141/85 (!) 168/108  Pulse: 67 60 77 76  Resp: '18 18 17   '$ Temp: 98 F (36.7 C) 98.4 F (36.9 C) 98.6 F (37 C)   TempSrc: Oral  Oral  SpO2: 97% 97% 96%   Weight:      Height:        Wt Readings from Last 3 Encounters:  06/28/21 51.7 kg  03/31/21 51.7 kg  01/29/20 56.7 kg     Intake/Output Summary (Last 24 hours) at 07/02/2021 1359 Last data filed at 07/02/2021 0543 Gross per 24 hour  Intake 1420.74 ml  Output 250 ml  Net 1170.74 ml        Physical Exam:   General:  Alert, oriented, cooperative, no distress; NG tube still in place  HEENT:  Normocephalic, PERRL, otherwise with in Normal limits   Neuro:  CNII-XII intact. , normal motor and sensation, reflexes intact   Lungs:   Clear to auscultation BL, Respirations unlabored, no wheezes / crackles  Cardio:    S1/S2, RRR, No murmure, No Rubs or Gallops   Abdomen:   Soft, non-tender, bowel sounds active all four quadrants,  no guarding or peritoneal signs.... Distended abdomen  Muscular skeletal:  Severe generalized weaknesses,  Limited exam - in bed, able to move all 4 extremities, ,  2+ pulses,  symmetric, No pitting edema  Skin:  Dry, warm to touch, negative for any Rashes,  Wounds: Please see nursing documentation  Pressure Injury 06/28/21 Coccyx Mid Deep Tissue Pressure Injury - Purple or maroon localized area of discolored intact skin or blood-filled blister due to damage of underlying soft tissue from pressure and/or shear. (Active)  06/28/21 2030  Location: Coccyx  Location Orientation: Mid  Staging: Deep Tissue Pressure Injury -  Purple or maroon localized area of discolored intact skin or blood-filled blister due to damage of underlying soft tissue from pressure and/or shear.  Wound Description (Comments):   Present on Admission: Yes             Data Review:   Micro Results Recent Results (from the past 240 hour(s))  Blood Culture (routine x 2)     Status: None   Collection Time: 06/24/21  6:35 PM   Specimen: Right Antecubital; Blood  Result Value Ref Range Status   Specimen Description RIGHT ANTECUBITAL  Final   Special Requests   Final    BOTTLES DRAWN AEROBIC AND ANAEROBIC Blood Culture adequate volume   Culture   Final    NO GROWTH 5 DAYS Performed at [facility], [address], [place], [place] [address]    Report Status 06/29/2021 FINAL  Final  Blood Culture (routine x 2)     Status: None   Collection Time: 06/24/21  6:42 PM   Specimen: Left Antecubital; Blood  Result Value Ref Range Status   Specimen Description LEFT ANTECUBITAL  Final   Special Requests   Final    BOTTLES DRAWN AEROBIC AND ANAEROBIC Blood Culture adequate volume   Culture   Final    NO GROWTH 5 DAYS Performed at [facility], [address], [place], [place] [address]    Report Status 06/29/2021 FINAL  Final  SARS CORONAVIRUS 2 (TAT 6-24 HRS) Nasopharyngeal Nasopharyngeal Swab     Status: None   Collection Time: 06/25/21  1:52 AM   Specimen: Nasopharyngeal Swab  Result Value Ref Range Status   SARS Coronavirus 2 NEGATIVE NEGATIVE Final    Comment: (NOTE) SARS-CoV-2 target nucleic acids are NOT DETECTED.  The SARS-CoV-2 RNA is generally detectable in upper and lower respiratory specimens during the acute phase of infection. Negative results do not preclude SARS-CoV-2 infection, do not rule out co-infections with other pathogens, and should not be  used as the sole basis for treatment or other patient management decisions. Negative results must be combined with clinical observations, patient history, and  epidemiological information. The expected result is Negative.  Fact Sheet for Patients: SugarRoll.be  Fact Sheet for Healthcare Providers: https://www.woods-mathews.com/  This test is not yet approved or cleared by the [place] FDA and  has been authorized for detection and/or diagnosis of SARS-CoV-2 by FDA under an Emergency Use Authorization (EUA). This EUA will remain  in effect (meaning this test can be used) for the duration of the COVID-19 declaration under Se ction 564(b)(1) of the Act, 21 U.S.C. section 360bbb-3(b)(1), unless the authorization is terminated or revoked sooner.  Performed at [facility], [place] [address] [place], [place] [address]     Radiology Reports CT ABDOMEN PELVIS WO CONTRAST  Result Date: 06/30/2021 CLINICAL DATA:  Abdominal distension, rectal mass on sigmoidoscopy performed yesterday. EXAM: CT ABDOMEN AND PELVIS WITHOUT CONTRAST TECHNIQUE: Multidetector CT imaging of the abdomen and pelvis was performed following the standard protocol without IV contrast. COMPARISON:  Multiple exams, including 06/24/2021 FINDINGS: Lower chest: Small bilateral pleural effusions with atelectasis in both lower lobes and mild atelectasis in the right middle lobe and lingula. Clips are noted along the upper glandular tissues of the right breast. Coronary atherosclerosis most notable in the left anterior descending coronary artery. Descending thoracic aortic atherosclerosis. Hepatobiliary: Poor visualization of the gallbladder. Otherwise unremarkable. Pancreas: Unremarkable Spleen: Unremarkable Adrenals/Urinary Tract: Foley catheter in the otherwise empty urinary bladder. No hydronephrosis or hydroureter. Normal renal contours. No renal calculi observed. Stomach/Bowel: A nasogastric tube terminates in the stomach body. There less distention of the stomach than on the prior exam. Increased thickening of the gastric wall probably  related to the lesser degree of distension compared to previous. A rectal tube is in place and contrast was administered via the rectal tube. 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 The rectum is fairly distended (possibly due to contrast filling), there is transition to smaller caliber at the rectosigmoid junction which partially fills with contrast medium for example on image 54 series 2. There is formed stool in the sigmoid colon along with various diverticular compatible with diverticulosis. The amount of formed stool in the colon is pronounced compatible with constipation and there is likely some redundancy of the sigmoid colon which is otherwise difficult to follow due to the paucity of intra-adipose tissue in this patient which makes separation of bowel loops difficult. The transverse colon does course down into the left lower quadrant and then back up towards the spleen, compatible with redundant transverse colon. No overtly dilated small bowel loops are identified. There is stranding in the perirectal space for example on image 65 of series 2 which may indicate inflammation or third spacing of fluid, some of this was also present on 06/24/2021. There is no extraluminal gas or substantial perirectal hematoma identified. Soft tissue density anterior to the rectum is compatible with uterine tissues. Vascular/Lymphatic: Aortoiliac atherosclerotic vascular disease. Reproductive: Uterus visualized although the contour is fairly indistinct. Other: There is potentially low-level mesenteric edema as well. Edema tracks into the left upper thigh region anteriorly and along the pubis. Musculoskeletal: There is a transverse fracture of the lower sternum on image 67 series 6. Age indeterminate nondisplaced rib fractures of the left fifth through  tenth ribs. Stable  prominent compression fracture at T10 and stable mild endplate compression fractures at L2 and along the superior endplate of L3. Stable mild superior endplate compression fracture at L4. IMPRESSION: 1. Interval decompression of the stomach with a nasogastric tube. 2. Rectal contrast was administered and L lined some anterior rectal irregularity probably corresponding to the biopsy tumor. 3. Prominent stool throughout the colon favors constipation. Redundant sigmoid colon and redundant transverse colon. 4. There is some increased presacral and perirectal stranding although this may be part of the or widespread third spacing of fluid process with small bilateral pleural effusions, bilateral subcutaneous edema, and mild mesenteric edema. 5. No findings of extraluminal gas or obvious abscess. Paucity of intra-adipose tissue makes separating adjacent bowel loops difficult. 6. Bibasilar atelectasis. 7. Subacute sternal fractures and subacute fractures of the left fifth through tenth ribs. 8. Prior compression fractures at T10, L2, L3, and L4. 9.  Aortic Atherosclerosis (ICD10-I70.0). Electronically Signed   By: Van Clines M.D.   On: 06/30/2021 14:57   DG Abd 1 View  Result Date: 07/01/2021 CLINICAL DATA:  Abdominal distension and pain. EXAM: ABDOMEN - 1 VIEW COMPARISON:  Comparison with June 27, 2021. FINDINGS: Gastric tube remains in the stomach. Increasing distension of bowel loops. Evacuation of contrast was present in the rectum compared to previous imaging. EKG leads project throughout the abdomen. Osteopenia. No acute skeletal process on limited assessment. IMPRESSION: Slight increase in distension of bowel loops. Evacuation of contrast in the rectum compared to previous imaging. Findings likely related to global ileus. Attention on follow-up. Electronically Signed   By: Zetta Bills M.D.   On: 07/01/2021 14:51   CT ABDOMEN PELVIS W CONTRAST  Result Date: 06/24/2021 CLINICAL  DATA:  Abdominal distension EXAM: CT ABDOMEN AND PELVIS WITH CONTRAST TECHNIQUE: Multidetector CT imaging of the abdomen and pelvis was performed using the standard protocol following bolus administration of intravenous contrast. CONTRAST:  64m OMNIPAQUE IOHEXOL 350 MG/ML SOLN COMPARISON:  CT 01/28/2019 FINDINGS: Lower chest: Bibasilar atelectasis. Heart size is mildly enlarged. Coronary artery calcifications are seen. Hepatobiliary: No focal liver abnormality is seen. No gallstones, gallbladder wall thickening, or biliary dilatation. Pancreas: Mildly atrophic, but appears grossly unremarkable. Spleen: Normal in size without focal abnormality. Adrenals/Urinary Tract: Unremarkable adrenal glands. Kidneys enhance symmetrically without focal lesion, stone, or hydronephrosis. Ureters are nondilated. Urinary bladder appears unremarkable. Stomach/Bowel: Markedly dilated stomach with fluid and air. No evidence of mechanical outlet obstruction. There is a moderate-sized rectal stool ball. Short segment area of narrowing at the rectosigmoid junction with a moderately dilated distal sigmoid colon. Moderate volume of stool throughout the remaining colon. What appears to be a normal appendix is seen in the right lower quadrant (series 4, image 51). No dilated loops of small bowel are seen within the abdomen. Vascular/Lymphatic: Atherosclerotic calcifications are present throughout the aortoiliac axis. No abdominopelvic lymphadenopathy. Reproductive: Uterus and bilateral adnexa are unremarkable. Other: No free fluid. No abdominopelvic fluid collection. No pneumoperitoneum. No abdominal wall hernia. Musculoskeletal: Diffuse osseous demineralization. Vertebra plana deformity of the T10 vertebral body, progressed from 2020. Mild chronic L2 compression deformity. IMPRESSION: 1. Markedly dilated stomach with fluid and air. No evidence of mechanical outlet obstruction. 2. Short segment area of narrowing at the rectosigmoid junction  with a moderately dilated distal sigmoid colon. Findings may represent a focal area of peristalsis, although a colonic mass is not entirely excluded. A follow-up colonoscopy could be considered. 3. Moderate-sized rectal stool ball. 4. Vertebra plana deformity of the T10 vertebral body, progressed from  2020. Mild chronic L2 compression deformity. 5. Aortic atherosclerosis (ICD10-I70.0). Electronically Signed   By: Davina Poke D.O.   On: 06/24/2021 20:07   DG CHEST PORT 1 VIEW  Result Date: 07/01/2021 CLINICAL DATA:  Shortness of breath abdominal pain, negative COVID assessment on 06/25/2021. EXAM: PORTABLE CHEST 1 VIEW COMPARISON:  June 26, 2021 and CT of the abdomen and pelvis from June 30, 2021. FINDINGS: Gastric tube in place, tip likely in mid stomach, side port below the level of the GE junction in the proximal to mid stomach. EKG leads project over the chest. Cardiomediastinal contours are stable. There is further consolidative change at the LEFT lung base in the retrocardiac region obscuring LEFT hemidiaphragm. Changes of percutaneous coronary intervention along the LEFT heart border as before. No lobar consolidative changes.  No effusion on the RIGHT. Diffuse bowel distension in the upper abdomen is similar to prior imaging. No acute skeletal process on limited assessment. IMPRESSION: 1. Increasing consolidative change at the LEFT lung base in the retrocardiac region. May reflect developing pneumonia or volume loss with pleural effusion. 2. Gastric tube in place, tip likely in mid stomach. Electronically Signed   By: Zetta Bills M.D.   On: 07/01/2021 14:49   DG CHEST PORT 1 VIEW  Result Date: 06/26/2021 CLINICAL DATA:  NG tube placement EXAM: PORTABLE CHEST 1 VIEW COMPARISON:  None. FINDINGS: NG tube with tip in the gastric body and side port below the GE junction. Low lung volumes.  Cardiac stent noted. IMPRESSION: NG tube with tip in stomach. Electronically Signed   By: Suzy Bouchard  M.D.   On: 06/26/2021 09:17   DG CHEST PORT 1 VIEW  Result Date: 06/25/2021 CLINICAL DATA:  Cough, NG tube placement EXAM: PORTABLE CHEST 1 VIEW COMPARISON:  01/29/2019 FINDINGS: Lungs are essentially clear.  No pleural effusion or pneumothorax. The heart is normal in size.  Coronary stent. Enteric tube courses into the mid stomach. IMPRESSION: Enteric tube courses into the mid stomach. Electronically Signed   By: Julian Hy M.D.   On: 06/25/2021 02:44   DG ABD ACUTE 2+V W 1V CHEST  Result Date: 06/27/2021 CLINICAL DATA:  Abdominal pain and gastric distension. Patient with severe pain throughout. Hx of HTN and MI. Negative covid-19 test x 3 days ago. EXAM: DG ABDOMEN ACUTE WITH 1 VIEW CHEST COMPARISON:  the previous day's study FINDINGS: Low lung volumes with patchy subsegmental atelectasis at the lung bases. Heart size upper limits normal for technique. Aortic Atherosclerosis (ICD10-170.0). Coronary stent. Blunting of the left lateral costophrenic angle suggesting small effusion. No pneumothorax. No free air. Normal bowel gas pattern. Nasogastric tube extends into the decompressed stomach. Urinary bladder is distended. Regional bones unremarkable. IMPRESSION: 1. Distended urinary bladder. 2. Low volumes with patchy subsegmental atelectasis at the lung bases, possible small left effusion. 3. For nasogastric tube to the decompressed stomach. Electronically Signed   By: Lucrezia Europe M.D.   On: 06/27/2021 07:29   DG ABD ACUTE 2+V W 1V CHEST  Result Date: 06/26/2021 CLINICAL DATA:  87 year old female with abdominal pain. EXAM: DG ABDOMEN ACUTE WITH 1 VIEW CHEST COMPARISON:  CT Abdomen and Pelvis 06/24/2021 and earlier. FINDINGS: Portable upright and supine views at 0442 hours. Enteric tube has been placed into the stomach, with evidence of resolved gastric distension since the recent CT. Mildly distended urinary bladder with excreted IV contrast. Continued low lung volumes. No pneumothorax or  pneumoperitoneum identified. Increased hypo ventilation at the left lung base. Stable cardiac size  and mediastinal contours. Gas-filled bowel loops elsewhere in the abdomen and pelvis appear stable from the recent CT. Osteopenia. No acute osseous abnormality identified. IMPRESSION: 1. Enteric tube placed into the stomach with evidence of gastric decompression since the recent CT. 2. Otherwise stable bowel gas pattern.  No pneumoperitoneum. 3. Interval decreased ventilation at the left lung base, perhaps atelectasis. Electronically Signed   By: Genevie Ann M.D.   On: 06/26/2021 05:25   DG Abdomen Acute W/Chest  Result Date: 06/24/2021 CLINICAL DATA:  Abdominal pain and distension. EXAM: DG ABDOMEN ACUTE WITH 1 VIEW CHEST COMPARISON:  CT 01/28/2019 FINDINGS: Anti lordotic positioning. Low lung volumes. Borderline cardiomegaly with coronary stent. No acute airspace disease or pleural effusion. No pneumothorax. No free intra-abdominal air. Marked gaseous gastric distension, similar gastric distension seen on prior exam when the stomach is fluid-filled. Large volume of colonic stool. There is also likely gaseous distension of transverse colon, although air-filled structure coursing across the mid abdomen may be distended stomach. No obvious small bowel dilatation. Prominent vascular calcifications are seen. The bones are diffusely under mineralized. No obvious acute osseous abnormality. Surgical clips in the right breast. IMPRESSION: 1. Marked gaseous gastric distension. The stomach was previously prominently distended with fluid. Air-filled structure coursing transversely across the mid abdomen may be gaseous distension of transverse colon or gastric distension. 2. Large volume of colonic stool. 3. Low lung volumes without acute abnormality in the chest. Electronically Signed   By: Keith Rake M.D.   On: 06/24/2021 17:59     CBC Recent Labs  Lab 06/26/21 0554 06/27/21 2208 06/28/21 0329 06/28/21 1151  06/29/21 0408  WBC 8.1  --  6.0  --  4.5  HGB 11.6* 12.4 11.3* 12.1 11.5*  HCT 33.6* 34.8* 31.7* 35.2* 33.6*  PLT 156  --  154  --  165  MCV 97.7  --  96.4  --  95.5  MCH 33.7  --  34.3*  --  32.7  MCHC 34.5  --  35.6  --  34.2  RDW 13.7  --  13.8  --  13.9    Chemistries  Recent Labs  Lab 06/26/21 0554 06/28/21 0329 06/29/21 0408 06/30/21 0538 07/01/21 0458 07/02/21 0745  NA 129* 133* 137 139 138 138  K 3.7 3.7 4.3 3.9 4.0 4.3  CL 102 106 109 112* 111 112*  CO2 21* '22 23 23 22 23  '$ GLUCOSE 164* 131* 201* 124* 133* 155*  BUN '18 11 10 8 9 '$ 6*  CREATININE 0.74 0.74 0.65 0.70 0.55 0.67  CALCIUM 7.8* 7.8* 8.3* 7.8* 8.1* 8.0*  AST 23  --   --   --   --   --   ALT 14  --   --   --   --   --   ALKPHOS 78  --   --   --   --   --   BILITOT 0.6  --   --   --   --   --    ------------------------------------------------------------------------------------------------------------------ No results for input(s): CHOL, HDL, LDLCALC, TRIG, CHOLHDL, LDLDIRECT in the last 72 hours.  Lab Results  Component Value Date   HGBA1C 5.0 11/25/2013   ------------------------------------------------------------------------------------------------------------------ No results for input(s): TSH, T4TOTAL, T3FREE, THYROIDAB in the last 72 hours.  Invalid input(s): FREET3 ------------------------------------------------------------------------------------------------------------------ No results for input(s): VITAMINB12, FOLATE, FERRITIN, TIBC, IRON, RETICCTPCT in the last 72 hours.  Coagulation profile No results for input(s): INR, PROTIME in the last 168 hours.   No  results for input(s): DDIMER in the last 72 hours.  Cardiac Enzymes No results for input(s): CKMB, TROPONINI, MYOGLOBIN in the last 168 hours.  Invalid input(s): CK ------------------------------------------------------------------------------------------------------------------ No results found for: BNP   Deatra James  M.D on 07/02/2021 at 1:59 PM  Go to [email or web address] - for contact info  [facility] - Office  [phone]

## 2021-07-03 LAB — BASIC METABOLIC PANEL
Anion gap: 4 — ABNORMAL LOW (ref 5–15)
BUN: 6 mg/dL — ABNORMAL LOW (ref 8–23)
CO2: 21 mmol/L — ABNORMAL LOW (ref 22–32)
Calcium: 8 mg/dL — ABNORMAL LOW (ref 8.9–10.3)
Chloride: 112 mmol/L — ABNORMAL HIGH (ref 98–111)
Creatinine, Ser: 0.61 mg/dL (ref 0.44–1.00)
GFR, Estimated: >60 mL/min (ref >60)
Glucose, Bld: 129 mg/dL — ABNORMAL HIGH (ref 70–99)
Potassium: 4.1 mmol/L (ref 3.5–5.1)
Sodium: 137 mmol/L (ref 135–145)

## 2021-07-03 NOTE — Progress Notes (Addendum)
Patient Demographics:    Debra Doyle, is a 87 y.o. female, DOB - [birth year], [id number]  Admit date - 06/24/2021   Admitting Physician [name], MD  Outpatient Primary MD for the patient is [name], MD  LOS - 9  CC: Abdominal pain,   Subjective:   The patient was seen and examined this morning, refusing to get out of bed or participate in any activities.  NG tube was successfully taken out.  Patient is tolerating clear liquid diet, per nursing staff and patient has had 1 stool and gas But abdomen remains distended  KUB consistent ileus On clear liquid diet No signs of obstruction    Assessment  & Plan :    Principal Problem:   Gastric distention Active Problems:   AKI (acute kidney injury) (HCC)   Hyponatremia   Mild protein-calorie malnutrition (HCC)   Hypotension   Abdominal distension   Rectal mass   Constipation   Shortness of breath   Ileus, unspecified (HCC)   Brief Summary:- 87 y.o. female, with history of coronary artery disease/Prior MI/ischemic cardiomyopathy and HTN--admitted on 06/25/2021 with abdominal pain nausea and vomiting and found to have significant gastric distention on CT abdomen and pelvis   Assessment/plan:   1)Abdominal pain with Nausea and Vomiting -abdominal distention -Tube was discontinued 07/02/2021 -KUB consistent with ileus passing gas and has had BMs, no signs of obstruction -Was started on Reglan -1 stool reported, with some gas per patient -Abdomen remains soft and distended  - Gasteric Ulcer/5 cm anal mass - significant gastric distention on CT abdomen and Pelvis.   Revealing no retrosigmoid obstruction with rectal mass -NG tube replaced on 06/26/21 -07/02/2021 -As oral intake improves, will discontinue IV fluids, continue PPI we will switch to p.o. -Continue MiraLAX, ambulation, following imaging, as needed Reglan  Per  surgery: -No surgical intervention, she can proceed with colonoscopy once rectal polyp was removed per general surgery - per GI:  colonoscopy as an outpatient   5 cm anal mass - EGD and colonoscopy on EGD and flex sig- - EGD: Nonbleeding Gastric ulcer, --Flex sig shows lower: anal mass 5 cm from anal verge --- status postbiopsy pending pathology  -Biopsy results --Path report -benign nonmalignant mass   2)Short segment area of Narrowing  S/p EGD and flex sig on 8/16/2022g at the Rectosigmoid junction with a moderately dilated distal sigmoid colon--- rectal stool ball noted on CT abdomen and pelvis as well  -At this point profound stricture is ruled out with CT scan with contrast-  -Malignancy of the mass has been ruled out -GI recommending colonoscopy as an outpatient in the future   -Continue laxatives --- Surgery following peripherally    3)Dehydration/AKI/HypoMagnesiemia/HypoKalemia/HypoNatremia-- -Due to poor p.o. intake, nausea vomiting-improved with treatment electrolytes and IV fluids - Na 124 >>>137, 139 -Resolved   4)Transiet Hypotension- -Resolved, sepsis ruled out AM Cortisol is 26.2, with no leukocytosis PCT is 0.23 Lactic acid 2.8 >>1.5 (lactic acidosis may be due to dehydration and poor tissue perfusion) -Discontinued vancomycin, cefepime and Flagyl   5) Hypertension -Increasing Coreg 12.5 mg p.o. twice daily, adding Norvasc 5 mg daily -Monitoring and titrating medication accordingly -Remained stable   Disposition/Need for in-Hospital Stay- patient unable to be discharged at this time  due to  -requiring IV fluids until able to tolerate oral intake, likely have a stricture due to rectal mass, needing further evaluation from GI and possibly surgery    Ethics -DNR/DNI Prognosis poor due to age, multiple comorbidities, decreased mobility decreased p.o. intake continue GI issues Discussed with family, his son on 07/01/2021   Status is: Inpatient  Remains  inpatient appropriate because: Please see disposition above  Disposition: The patient is from: Home              Anticipated d/c is to: Home with HH Vs SNF              Anticipated d/c date is: 2 days              Patient currently is not medically stable to d/c. Barriers: Not Clinically Stable-   Code Status :  -  Code Status: DNR   Family Communication:   (patient is alert, awake and coherent)  Son [name] at [phone] -discussed with son and daughter-in-law regarding the findings, steady decline refusing to participate in any physical poor p.o. intake, consistent abdominal pain, distention, multiple comorbidities ... they Agreed to palliative care consultation with goal of hospice Consults  :  Gi/surg.   DVT Prophylaxis  :   - SCDs   Place and maintain sequential compression device Start: 06/27/21 2139 SCDs Start: 06/24/21 2330    Lab Results  Component Value Date   PLT 165 06/29/2021    Inpatient Medications  Scheduled Meds:  amLODipine  5 mg Oral Daily   carvedilol  12.5 mg Oral BID WC   Chlorhexidine Gluconate Cloth  6 each Topical Daily   lidocaine  1 patch Transdermal Q24H   metoCLOPramide (REGLAN) injection  5 mg Intravenous Q8H   pantoprazole (PROTONIX) IV  40 mg Intravenous Q12H   polyethylene glycol  17 g Oral TID   senna-docusate  2 tablet Oral BID   sodium phosphate  1 enema Rectal Once   Continuous Infusions:  dextrose 5 % and 0.9 % NaCl with KCl 20 mEq/L 50 mL/hr at 07/03/21 0915   PRN Meds:.acetaminophen **OR** acetaminophen, benzonatate, hydrALAZINE, morphine injection, ondansetron **OR** ondansetron (ZOFRAN) IV, phenol    Anti-infectives (From admission, onward)    Start     Dose/Rate Route Frequency Ordered Stop   06/25/21 1800  ceFEPIme (MAXIPIME) 2 g in sodium chloride 0.9 % 100 mL IVPB  Status:  Discontinued        2 g 200 mL/hr over 30 Minutes Intravenous Every 24 hours 06/24/21 1851 06/27/21 1255   06/25/21 1800  vancomycin  (VANCOREADY) IVPB 500 mg/100 mL        500 mg 100 mL/hr over 60 Minutes Intravenous Every 24 hours 06/24/21 1856 06/25/21 2359   06/24/21 1830  ceFEPIme (MAXIPIME) 2 g in sodium chloride 0.9 % 100 mL IVPB        2 g 200 mL/hr over 30 Minutes Intravenous  Once 06/24/21 1829 06/24/21 2118   06/24/21 1830  metroNIDAZOLE (FLAGYL) IVPB 500 mg  Status:  Discontinued        500 mg 100 mL/hr over 60 Minutes Intravenous Every 8 hours 06/24/21 1829 06/27/21 1255   06/24/21 1830  vancomycin (VANCOCIN) IVPB 1000 mg/200 mL premix        1,000 mg 200 mL/hr over 60 Minutes Intravenous  Once 06/24/21 1829 06/24/21 2118         Objective:   Vitals:   07/02/21 1520 07/02/21 2130 07/03/21  0419 07/03/21 0809  BP: 138/85 137/87 134/82 (!) 163/93  Pulse: 73 73 71 74  Resp:  20 18   Temp: 98.5 F (36.9 C) 98.6 F (37 C) 98.9 F (37.2 C)   TempSrc: Oral Oral Oral   SpO2: 97% 97% 97%   Weight:      Height:        Wt Readings from Last 3 Encounters:  06/28/21 51.7 kg  03/31/21 51.7 kg  01/29/20 56.7 kg     Intake/Output Summary (Last 24 hours) at 07/03/2021 1149 Last data filed at 07/03/2021 0900 Gross per 24 hour  Intake 600 ml  Output 500 ml  Net 100 ml     Physical Exam:   General:  Alert, oriented, cooperative, no distress; refusing to get out of bed  HEENT:  Normocephalic, PERRL, otherwise with in Normal limits   Neuro:  CNII-XII intact. , normal motor and sensation, reflexes intact   Lungs:   Clear to auscultation BL, Respirations unlabored, no wheezes / crackles  Cardio:    S1/S2, RRR, No murmure, No Rubs or Gallops   Abdomen:   Soft, non-tender, bowel sounds active all four quadrants,  no guarding or peritoneal signs.  Abdomen distended  Muscular skeletal:  Severe generalized global weakness, with muscle wasting,  Otherwise Limited exam - in bed, able to move all 4 extremities,2+ pulses,  symmetric, No pitting edema  Skin:  Dry, warm to touch, negative for any Rashes,  Wounds:  Please see nursing documentation  Pressure Injury 06/28/21 Coccyx Mid Deep Tissue Pressure Injury - Purple or maroon localized area of discolored intact skin or blood-filled blister due to damage of underlying soft tissue from pressure and/or shear. (Active)  06/28/21 2030  Location: Coccyx  Location Orientation: Mid  Staging: Deep Tissue Pressure Injury - Purple or maroon localized area of discolored intact skin or blood-filled blister due to damage of underlying soft tissue from pressure and/or shear.  Wound Description (Comments):   Present on Admission: Yes               Data Review:   Micro Results Recent Results (from the past 240 hour(s))  Blood Culture (routine x 2)     Status: None   Collection Time: 06/24/21  6:35 PM   Specimen: Right Antecubital; Blood  Result Value Ref Range Status   Specimen Description RIGHT ANTECUBITAL  Final   Special Requests   Final    BOTTLES DRAWN AEROBIC AND ANAEROBIC Blood Culture adequate volume   Culture   Final    NO GROWTH 5 DAYS Performed at [facility], [address] [place], [place] [address]    Report Status 06/29/2021 FINAL  Final  Blood Culture (routine x 2)     Status: None   Collection Time: 06/24/21  6:42 PM   Specimen: Left Antecubital; Blood  Result Value Ref Range Status   Specimen Description LEFT ANTECUBITAL  Final   Special Requests   Final    BOTTLES DRAWN AEROBIC AND ANAEROBIC Blood Culture adequate volume   Culture   Final    NO GROWTH 5 DAYS Performed at [facility], [address], [place],  [address]    Report Status 06/29/2021 FINAL  Final  SARS CORONAVIRUS 2 (TAT 6-24 HRS) Nasopharyngeal Nasopharyngeal Swab     Status: None   Collection Time: 06/25/21  1:52 AM   Specimen: Nasopharyngeal Swab  Result Value Ref Range Status   SARS Coronavirus 2 NEGATIVE NEGATIVE Final  Comment: (NOTE) SARS-CoV-2 target nucleic acids are NOT DETECTED.  The SARS-CoV-2 RNA is generally detectable in upper and  lower respiratory specimens during the acute phase of infection. Negative results do not preclude SARS-CoV-2 infection, do not rule out co-infections with other pathogens, and should not be used as the sole basis for treatment or other patient management decisions. Negative results must be combined with clinical observations, patient history, and epidemiological information. The expected result is Negative.  Fact Sheet for Patients: SugarRoll.be  Fact Sheet for Healthcare Providers: https://www.woods-mathews.com/  This test is not yet approved or cleared by the [place] FDA and  has been authorized for detection and/or diagnosis of SARS-CoV-2 by FDA under an Emergency Use Authorization (EUA). This EUA will remain  in effect (meaning this test can be used) for the duration of the COVID-19 declaration under Se ction 564(b)(1) of the Act, 21 U.S.C. section 360bbb-3(b)(1), unless the authorization is terminated or revoked sooner.  Performed at [facility], [place] [address], [place], [place] [address]     Radiology Reports CT ABDOMEN PELVIS WO CONTRAST  Result Date: 06/30/2021 CLINICAL DATA:  Abdominal distension, rectal mass on sigmoidoscopy performed yesterday. EXAM: CT ABDOMEN AND PELVIS WITHOUT CONTRAST TECHNIQUE: Multidetector CT imaging of the abdomen and pelvis was performed following the standard protocol without IV contrast. COMPARISON:  Multiple exams, including 06/24/2021 FINDINGS: Lower chest: Small bilateral pleural effusions with atelectasis in both lower lobes and mild atelectasis in the right middle lobe and lingula. Clips are noted along the upper glandular tissues of the right breast. Coronary atherosclerosis most notable in the left anterior descending coronary artery. Descending thoracic aortic atherosclerosis. Hepatobiliary: Poor visualization of the gallbladder. Otherwise unremarkable. Pancreas: Unremarkable Spleen:  Unremarkable Adrenals/Urinary Tract: Foley catheter in the otherwise empty urinary bladder. No hydronephrosis or hydroureter. Normal renal contours. No renal calculi observed. Stomach/Bowel: A nasogastric tube terminates in the stomach body. There less distention of the stomach than on the prior exam. Increased thickening of the gastric wall probably related to the lesser degree of distension compared to previous. A rectal tube is in place and contrast was administered via the rectal tube. 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 The rectum is fairly distended (possibly due to contrast filling), there is transition to smaller caliber at the rectosigmoid junction which partially fills with contrast medium for example on image 54 series 2. There is formed stool in the sigmoid colon along with various diverticular compatible with diverticulosis. The amount of formed stool in the colon is pronounced compatible with constipation and there is likely some redundancy of the sigmoid colon which is otherwise difficult to follow due to the paucity of intra-adipose tissue in this patient which makes separation of bowel loops difficult. The transverse colon does course down into the left lower quadrant and then back up towards the spleen, compatible with redundant transverse colon. No overtly dilated small bowel loops are identified. There is stranding in the perirectal space for example on image 65 of series 2 which may indicate inflammation or third spacing of fluid, some of this was also present on 06/24/2021. There is no extraluminal gas or substantial perirectal hematoma identified. Soft tissue density anterior to the rectum is compatible with uterine tissues. Vascular/Lymphatic: Aortoiliac atherosclerotic vascular disease. Reproductive: Uterus  visualized although the contour is fairly indistinct. Other: There is  potentially low-level mesenteric edema as well. Edema tracks into the left upper thigh region anteriorly and along the pubis. Musculoskeletal: There is a transverse fracture of the lower sternum on image 67 series 6. Age indeterminate nondisplaced rib fractures of the left fifth through tenth ribs. Stable prominent compression fracture at T10 and stable mild endplate compression fractures at L2 and along the superior endplate of L3. Stable mild superior endplate compression fracture at L4. IMPRESSION: 1. Interval decompression of the stomach with a nasogastric tube. 2. Rectal contrast was administered and L lined some anterior rectal irregularity probably corresponding to the biopsy tumor. 3. Prominent stool throughout the colon favors constipation. Redundant sigmoid colon and redundant transverse colon. 4. There is some increased presacral and perirectal stranding although this may be part of the or widespread third spacing of fluid process with small bilateral pleural effusions, bilateral subcutaneous edema, and mild mesenteric edema. 5. No findings of extraluminal gas or obvious abscess. Paucity of intra-adipose tissue makes separating adjacent bowel loops difficult. 6. Bibasilar atelectasis. 7. Subacute sternal fractures and subacute fractures of the left fifth through tenth ribs. 8. Prior compression fractures at T10, L2, L3, and L4. 9.  Aortic Atherosclerosis (ICD10-I70.0). Electronically Signed   By: Van Clines M.D.   On: 06/30/2021 14:57   DG Abd 1 View  Result Date: 07/01/2021 CLINICAL DATA:  Abdominal distension and pain. EXAM: ABDOMEN - 1 VIEW COMPARISON:  Comparison with June 27, 2021. FINDINGS: Gastric tube remains in the stomach. Increasing distension of bowel loops. Evacuation of contrast was present in the rectum compared to previous imaging. EKG leads project throughout the abdomen. Osteopenia. No acute skeletal  process on limited assessment. IMPRESSION: Slight increase in distension of bowel loops. Evacuation of contrast in the rectum compared to previous imaging. Findings likely related to global ileus. Attention on follow-up. Electronically Signed   By: Zetta Bills M.D.   On: 07/01/2021 14:51   CT ABDOMEN PELVIS W CONTRAST  Result Date: 06/24/2021 CLINICAL DATA:  Abdominal distension EXAM: CT ABDOMEN AND PELVIS WITH CONTRAST TECHNIQUE: Multidetector CT imaging of the abdomen and pelvis was performed using the standard protocol following bolus administration of intravenous contrast. CONTRAST:  58m OMNIPAQUE IOHEXOL 350 MG/ML SOLN COMPARISON:  CT 01/28/2019 FINDINGS: Lower chest: Bibasilar atelectasis. Heart size is mildly enlarged. Coronary artery calcifications are seen. Hepatobiliary: No focal liver abnormality is seen. No gallstones, gallbladder wall thickening, or biliary dilatation. Pancreas: Mildly atrophic, but appears grossly unremarkable. Spleen: Normal in size without focal abnormality. Adrenals/Urinary Tract: Unremarkable adrenal glands. Kidneys enhance symmetrically without focal lesion, stone, or hydronephrosis. Ureters are nondilated. Urinary bladder appears unremarkable. Stomach/Bowel: Markedly dilated stomach with fluid and air. No evidence of mechanical outlet obstruction. There is a moderate-sized rectal stool ball. Short segment area of narrowing at the rectosigmoid junction with a moderately dilated distal sigmoid colon. Moderate volume of stool throughout the remaining colon. What appears to be a normal appendix is seen in the right lower quadrant (series 4, image 51). No dilated loops of small bowel are seen within the abdomen. Vascular/Lymphatic: Atherosclerotic calcifications are present throughout the aortoiliac axis. No abdominopelvic lymphadenopathy. Reproductive: Uterus and bilateral adnexa are unremarkable. Other: No free fluid. No abdominopelvic fluid collection. No pneumoperitoneum.  No abdominal wall hernia. Musculoskeletal: Diffuse osseous demineralization. Vertebra plana deformity of the T10 vertebral body, progressed from 2020. Mild chronic L2 compression deformity. IMPRESSION: 1. Markedly dilated stomach with fluid and air. No evidence of mechanical outlet obstruction. 2. Short segment area of narrowing at the  rectosigmoid junction with a moderately dilated distal sigmoid colon. Findings may represent a focal area of peristalsis, although a colonic mass is not entirely excluded. A follow-up colonoscopy could be considered. 3. Moderate-sized rectal stool ball. 4. Vertebra plana deformity of the T10 vertebral body, progressed from 2020. Mild chronic L2 compression deformity. 5. Aortic atherosclerosis (ICD10-I70.0). Electronically Signed   By: Davina Poke D.O.   On: 06/24/2021 20:07   DG CHEST PORT 1 VIEW  Result Date: 07/01/2021 CLINICAL DATA:  Shortness of breath abdominal pain, negative COVID assessment on 06/25/2021. EXAM: PORTABLE CHEST 1 VIEW COMPARISON:  June 26, 2021 and CT of the abdomen and pelvis from June 30, 2021. FINDINGS: Gastric tube in place, tip likely in mid stomach, side port below the level of the GE junction in the proximal to mid stomach. EKG leads project over the chest. Cardiomediastinal contours are stable. There is further consolidative change at the LEFT lung base in the retrocardiac region obscuring LEFT hemidiaphragm. Changes of percutaneous coronary intervention along the LEFT heart border as before. No lobar consolidative changes.  No effusion on the RIGHT. Diffuse bowel distension in the upper abdomen is similar to prior imaging. No acute skeletal process on limited assessment. IMPRESSION: 1. Increasing consolidative change at the LEFT lung base in the retrocardiac region. May reflect developing pneumonia or volume loss with pleural effusion. 2. Gastric tube in place, tip likely in mid stomach. Electronically Signed   By: Zetta Bills M.D.   On:  07/01/2021 14:49   DG CHEST PORT 1 VIEW  Result Date: 06/26/2021 CLINICAL DATA:  NG tube placement EXAM: PORTABLE CHEST 1 VIEW COMPARISON:  None. FINDINGS: NG tube with tip in the gastric body and side port below the GE junction. Low lung volumes.  Cardiac stent noted. IMPRESSION: NG tube with tip in stomach. Electronically Signed   By: Suzy Bouchard M.D.   On: 06/26/2021 09:17   DG CHEST PORT 1 VIEW  Result Date: 06/25/2021 CLINICAL DATA:  Cough, NG tube placement EXAM: PORTABLE CHEST 1 VIEW COMPARISON:  01/29/2019 FINDINGS: Lungs are essentially clear.  No pleural effusion or pneumothorax. The heart is normal in size.  Coronary stent. Enteric tube courses into the mid stomach. IMPRESSION: Enteric tube courses into the mid stomach. Electronically Signed   By: Julian Hy M.D.   On: 06/25/2021 02:44   DG ABD ACUTE 2+V W 1V CHEST  Result Date: 06/27/2021 CLINICAL DATA:  Abdominal pain and gastric distension. Patient with severe pain throughout. Hx of HTN and MI. Negative covid-19 test x 3 days ago. EXAM: DG ABDOMEN ACUTE WITH 1 VIEW CHEST COMPARISON:  the previous day's study FINDINGS: Low lung volumes with patchy subsegmental atelectasis at the lung bases. Heart size upper limits normal for technique. Aortic Atherosclerosis (ICD10-170.0). Coronary stent. Blunting of the left lateral costophrenic angle suggesting small effusion. No pneumothorax. No free air. Normal bowel gas pattern. Nasogastric tube extends into the decompressed stomach. Urinary bladder is distended. Regional bones unremarkable. IMPRESSION: 1. Distended urinary bladder. 2. Low volumes with patchy subsegmental atelectasis at the lung bases, possible small left effusion. 3. For nasogastric tube to the decompressed stomach. Electronically Signed   By: Lucrezia Europe M.D.   On: 06/27/2021 07:29   DG ABD ACUTE 2+V W 1V CHEST  Result Date: 06/26/2021 CLINICAL DATA:  87 year old female with abdominal pain. EXAM: DG ABDOMEN ACUTE WITH 1  VIEW CHEST COMPARISON:  CT Abdomen and Pelvis 06/24/2021 and earlier. FINDINGS: Portable upright and supine views at [phone]  hours. Enteric tube has been placed into the stomach, with evidence of resolved gastric distension since the recent CT. Mildly distended urinary bladder with excreted IV contrast. Continued low lung volumes. No pneumothorax or pneumoperitoneum identified. Increased hypo ventilation at the left lung base. Stable cardiac size and mediastinal contours. Gas-filled bowel loops elsewhere in the abdomen and pelvis appear stable from the recent CT. Osteopenia. No acute osseous abnormality identified. IMPRESSION: 1. Enteric tube placed into the stomach with evidence of gastric decompression since the recent CT. 2. Otherwise stable bowel gas pattern.  No pneumoperitoneum. 3. Interval decreased ventilation at the left lung base, perhaps atelectasis. Electronically Signed   By: Genevie Ann M.D.   On: 06/26/2021 05:25   DG Abdomen Acute W/Chest  Result Date: 06/24/2021 CLINICAL DATA:  Abdominal pain and distension. EXAM: DG ABDOMEN ACUTE WITH 1 VIEW CHEST COMPARISON:  CT 01/28/2019 FINDINGS: Anti lordotic positioning. Low lung volumes. Borderline cardiomegaly with coronary stent. No acute airspace disease or pleural effusion. No pneumothorax. No free intra-abdominal air. Marked gaseous gastric distension, similar gastric distension seen on prior exam when the stomach is fluid-filled. Large volume of colonic stool. There is also likely gaseous distension of transverse colon, although air-filled structure coursing across the mid abdomen may be distended stomach. No obvious small bowel dilatation. Prominent vascular calcifications are seen. The bones are diffusely under mineralized. No obvious acute osseous abnormality. Surgical clips in the right breast. IMPRESSION: 1. Marked gaseous gastric distension. The stomach was previously prominently distended with fluid. Air-filled structure coursing transversely  across the mid abdomen may be gaseous distension of transverse colon or gastric distension. 2. Large volume of colonic stool. 3. Low lung volumes without acute abnormality in the chest. Electronically Signed   By: Keith Rake M.D.   On: 06/24/2021 17:59     CBC Recent Labs  Lab 06/27/21 2208 06/28/21 0329 06/28/21 1151 06/29/21 0408  WBC  --  6.0  --  4.5  HGB 12.4 11.3* 12.1 11.5*  HCT 34.8* 31.7* 35.2* 33.6*  PLT  --  154  --  165  MCV  --  96.4  --  95.5  MCH  --  34.3*  --  32.7  MCHC  --  35.6  --  34.2  RDW  --  13.8  --  13.9    Chemistries  Recent Labs  Lab 06/29/21 0408 06/30/21 0538 07/01/21 0458 07/02/21 0745 07/03/21 0553  NA 137 139 138 138 137  K 4.3 3.9 4.0 4.3 4.1  CL 109 112* 111 112* 112*  CO2 '23 23 22 23 '$ 21*  GLUCOSE 201* 124* 133* 155* 129*  BUN '10 8 9 '$ 6* 6*  CREATININE 0.65 0.70 0.55 0.67 0.61  CALCIUM 8.3* 7.8* 8.1* 8.0* 8.0*   ------------------------------------------------------------------------------------------------------------------ No results for input(s): CHOL, HDL, LDLCALC, TRIG, CHOLHDL, LDLDIRECT in the last 72 hours.  Lab Results  Component Value Date   HGBA1C 5.0 11/25/2013   ------------------------------------------------------------------------------------------------------------------ No results for input(s): TSH, T4TOTAL, T3FREE, THYROIDAB in the last 72 hours.  Invalid input(s): FREET3 ------------------------------------------------------------------------------------------------------------------ No results for input(s): VITAMINB12, FOLATE, FERRITIN, TIBC, IRON, RETICCTPCT in the last 72 hours.  Coagulation profile No results for input(s): INR, PROTIME in the last 168 hours.   No results for input(s): DDIMER in the last 72 hours.  Cardiac Enzymes No results for input(s): CKMB, TROPONINI, MYOGLOBIN in the last 168 hours.  Invalid input(s):  CK ------------------------------------------------------------------------------------------------------------------ No results found for: BNP   Deatra James M.D on 07/03/2021 at 11:49  AM  Go to [email or web address] - for contact info  [facility] - Office  [phone]

## 2021-07-03 NOTE — Plan of Care (Signed)
  Problem: Education: Goal: Knowledge of General Education information will improve Description Including pain rating scale, medication(s)/side effects and non-pharmacologic comfort measures Outcome: Progressing   

## 2021-07-03 NOTE — Progress Notes (Signed)
Debra Doyle, M.D. Gastroenterology & Hepatology   Interval History:  No acute events overnight. Patient had her NG tube removed and is tolerating adequately a clear liquid diet.  Has not presented any vomiting or nausea.  Has moved her bowels with current MiraLAX regimen. Denies having any abdominal pain but reports that her abdomen is still distended.  No fever, chills, melena or hematochezia. BMP today is unremarkable.  Inpatient Medications:  Current Facility-Administered Medications:    acetaminophen (TYLENOL) tablet 650 mg, 650 mg, Oral, Q6H PRN **OR** acetaminophen (TYLENOL) suppository 650 mg, 650 mg, Rectal, Q6H PRN, Zierle-Ghosh, Asia B, DO   amLODipine (NORVASC) tablet 5 mg, 5 mg, Oral, Daily, Shahmehdi, Seyed A, MD, 5 mg at 07/03/21 0809   benzonatate (TESSALON) capsule 200 mg, 200 mg, Oral, TID PRN, Zierle-Ghosh, Asia B, DO   carvedilol (COREG) tablet 12.5 mg, 12.5 mg, Oral, BID WC, Shahmehdi, Seyed A, MD, 12.5 mg at 07/03/21 0809   Chlorhexidine Gluconate Cloth 2 % PADS 6 each, 6 each, Topical, Daily, Shahmehdi, Seyed A, MD, 6 each at 07/03/21 1055   hydrALAZINE (APRESOLINE) injection 10 mg, 10 mg, Intravenous, Q4H PRN, Shahmehdi, Seyed A, MD   lidocaine (LIDODERM) 5 % 1 patch, 1 patch, Transdermal, Q24H, Zierle-Ghosh, Asia B, DO, 1 patch at 07/01/21 2326   metoCLOPramide (REGLAN) injection 5 mg, 5 mg, Intravenous, Q8H, Virl Cagey, MD, 5 mg at 07/03/21 0550   morphine 2 MG/ML injection 2 mg, 2 mg, Intravenous, Q4H PRN, Shahmehdi, Seyed A, MD   ondansetron (ZOFRAN) tablet 4 mg, 4 mg, Oral, Q6H PRN **OR** ondansetron (ZOFRAN) injection 4 mg, 4 mg, Intravenous, Q6H PRN, Zierle-Ghosh, Asia B, DO   pantoprazole (PROTONIX) injection 40 mg, 40 mg, Intravenous, Q12H, Zierle-Ghosh, Asia B, DO, 40 mg at 07/03/21 0809   phenol (CHLORASEPTIC) mouth spray 1 spray, 1 spray, Mouth/Throat, PRN, Emokpae, Courage, MD   polyethylene glycol (MIRALAX / GLYCOLAX) packet 17 g, 17 g, Oral,  TID, Mahala Menghini, PA-C, 17 g at 07/03/21 0809   senna-docusate (Senokot-S) tablet 2 tablet, 2 tablet, Oral, BID, [place] Brick, Courage, MD, 2 tablet at 07/03/21 0809   sodium phosphate (FLEET) 7-19 GM/118ML enema 1 enema, 1 enema, Rectal, Once, Eloise Harman, DO   I/O    Intake/Output Summary (Last 24 hours) at 07/03/2021 1239 Last data filed at 07/03/2021 0900 Gross per 24 hour  Intake 600 ml  Output 500 ml  Net 100 ml     Physical Exam: Temp:  [98.5 F (36.9 C)-98.9 F (37.2 C)] 98.9 F (37.2 C) (08/21 0419) Pulse Rate:  [71-74] 74 (08/21 0809) Resp:  [18-20] 18 (08/21 0419) BP: (134-163)/(82-93) 163/93 (08/21 0809) SpO2:  [97 %] 97 % (08/21 0419)  Temp (24hrs), Avg:98.7 F (37.1 C), Min:98.5 F (36.9 C), Max:98.9 F (37.2 C) GENERAL: The patient is AO x3, in no acute distress. Elder and frail. HEENT: Head is normocephalic and atraumatic. EOMI are intact. Mouth is well hydrated and without lesions. NECK: Supple. No masses LUNGS: Clear to auscultation. No presence of rhonchi/wheezing/rales. Adequate chest expansion HEART: RRR, normal s1 and s2. ABDOMEN: nontender, no guarding, no peritoneal signs. Has presence of distention diffusely in upper abdomen. BS + but diminished. No masses. EXTREMITIES: Without any cyanosis, clubbing, rash, lesions or edema. NEUROLOGIC: AOx3, no focal motor deficit. SKIN: no jaundice, no rashes  Laboratory Data: CBC:     Component Value Date/Time   WBC 4.5 06/29/2021 0408   RBC 3.52 (L) 06/29/2021 0408   HGB 11.5 (L)  06/29/2021 0408   HCT 33.6 (L) 06/29/2021 0408   PLT 165 06/29/2021 0408   MCV 95.5 06/29/2021 0408   MCH 32.7 06/29/2021 0408   MCHC 34.2 06/29/2021 0408   RDW 13.9 06/29/2021 0408   LYMPHSABS 0.8 06/25/2021 0455   MONOABS 0.7 06/25/2021 0455   EOSABS 0.0 06/25/2021 0455   BASOSABS 0.0 06/25/2021 0455   COAG:  Lab Results  Component Value Date   INR 1.2 06/25/2021   INR 1.2 06/24/2021   INR 1.4 (H) 01/28/2019     BMP:  BMP Latest Ref Rng & Units 07/03/2021 07/02/2021 07/01/2021  Glucose 70 - 99 mg/dL 129(H) 155(H) 133(H)  BUN 8 - 23 mg/dL 6(L) 6(L) 9  Creatinine 0.44 - 1.00 mg/dL 0.61 0.67 0.55  Sodium 135 - 145 mmol/L 137 138 138  Potassium 3.5 - 5.1 mmol/L 4.1 4.3 4.0  Chloride 98 - 111 mmol/L 112(H) 112(H) 111  CO2 22 - 32 mmol/L 21(L) 23 22  Calcium 8.9 - 10.3 mg/dL 8.0(L) 8.0(L) 8.1(L)    HEPATIC:  Hepatic Function Latest Ref Rng & Units 06/26/2021 06/25/2021 06/24/2021  Total Protein 6.5 - 8.1 g/dL 5.3(L) 5.6(L) 6.5  Albumin 3.5 - 5.0 g/dL 2.6(L) 2.9(L) 3.4(L)  AST 15 - 41 U/L 23 23 28   ALT 0 - 44 U/L 14 16 19   Alk Phosphatase 38 - 126 U/L 78 89 110  Total Bilirubin 0.3 - 1.2 mg/dL 0.6 1.1 1.0  Bilirubin, Direct 0.0 - 0.3 mg/dL - - -    CARDIAC:  Lab Results  Component Value Date   CKTOTAL 820 (H) 12/10/2010   CKMB (HH) 12/10/2010    97.4 CRITICAL VALUE NOTED.  VALUE IS CONSISTENT WITH PREVIOUSLY REPORTED AND CALLED VALUE.   TROPONINI >20.00 ([place]) 11/26/2013      Imaging: I personally reviewed and interpreted the available labs, imaging and endoscopic files.   Assessment/Plan: 87 year old female with multiple comorbidities including coronary artery disease status post PCI with stent placement, prior myocardial infarction, ischemic cardiomyopathy, hypertension, who was admitted to the hospital after presenting worsening abdominal pain and distention, nausea and vomiting.  There was presence of large distention of the gastric chamber and questionable narrowing at the rectosigmoid junction with presence of a rectal stool ball.  She had fecal disimpaction performed but without any improvement so an NG tube was placed and drainage has been minimal recently.  Due to the concern for possible partial obstruction, an EGD and flexible sigmoidoscopy were performed that showed a nonbleeding gastric ulcer with normal histology negative for H. pylori and normal duodenum without presence of any  obstructive lesions.  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 No obstructive lesions have been found.   Overall, the patient has presented clinical picture that is more consistent with a generalized dysmotility in her gastrointestinal system, likely worsened by her poor mobility.  Reglan trial was started and she has been on bowel regimen with some bowel movements but still has some gas distention.  I emphasized again to the nursing staff the need to mobilize her with a walker so she can have some progress in her gas mobilization.  We will also advance her to clear liquid diet which was discussed with general surgery, NG tube can be removed.   - C/w CLD - Continue with Reglan 5 mg TID - Frequent mobilization with walker -  Miralax TID - Minimize opiate or constipating medications - Patient and son will discuss potential outpatient colonoscopy after they have thoroughly discussed benefits vs risks given her age and comorbidities, as well as life expectancy   Debra Peppers, MD Gastroenterology and Hepatology [facility]

## 2021-07-04 ENCOUNTER — Inpatient Hospital Stay (HOSPITAL_COMMUNITY): Payer: PPO

## 2021-07-04 DIAGNOSIS — R1084 Generalized abdominal pain: Secondary | ICD-10-CM | POA: Diagnosis not present

## 2021-07-04 DIAGNOSIS — Z515 Encounter for palliative care: Secondary | ICD-10-CM

## 2021-07-04 DIAGNOSIS — Z7189 Other specified counseling: Secondary | ICD-10-CM

## 2021-07-04 DIAGNOSIS — K3189 Other diseases of stomach and duodenum: Secondary | ICD-10-CM | POA: Diagnosis not present

## 2021-07-04 LAB — CBC WITH DIFFERENTIAL/PLATELET
Abs Immature Granulocytes: 0.1 10*3/uL — ABNORMAL HIGH (ref 0.00–0.07)
Basophils Absolute: 0 10*3/uL (ref 0.0–0.1)
Basophils Relative: 0 %
Eosinophils Absolute: 0.1 10*3/uL (ref 0.0–0.5)
Eosinophils Relative: 1 %
HCT: 33.3 % — ABNORMAL LOW (ref 36.0–46.0)
Hemoglobin: 11.1 g/dL — ABNORMAL LOW (ref 12.0–15.0)
Immature Granulocytes: 1 %
Lymphocytes Relative: 6 %
Lymphs Abs: 0.6 10*3/uL — ABNORMAL LOW (ref 0.7–4.0)
MCH: 33.9 pg (ref 26.0–34.0)
MCHC: 33.3 g/dL (ref 30.0–36.0)
MCV: 101.8 fL — ABNORMAL HIGH (ref 80.0–100.0)
Monocytes Absolute: 0.3 10*3/uL (ref 0.1–1.0)
Monocytes Relative: 3 %
Neutro Abs: 8.5 10*3/uL — ABNORMAL HIGH (ref 1.7–7.7)
Neutrophils Relative %: 89 %
Platelets: 188 10*3/uL (ref 150–400)
RBC: 3.27 MIL/uL — ABNORMAL LOW (ref 3.87–5.11)
RDW: 15.3 % (ref 11.5–15.5)
WBC: 9.6 10*3/uL (ref 4.0–10.5)
nRBC: 0 % (ref 0.0–0.2)

## 2021-07-04 LAB — BASIC METABOLIC PANEL
Anion gap: 7 (ref 5–15)
BUN: 8 mg/dL (ref 8–23)
CO2: 20 mmol/L — ABNORMAL LOW (ref 22–32)
Calcium: 8.3 mg/dL — ABNORMAL LOW (ref 8.9–10.3)
Chloride: 110 mmol/L (ref 98–111)
Creatinine, Ser: 0.56 mg/dL (ref 0.44–1.00)
GFR, Estimated: >60 mL/min (ref >60)
Glucose, Bld: 133 mg/dL — ABNORMAL HIGH (ref 70–99)
Potassium: 3.6 mmol/L (ref 3.5–5.1)
Sodium: 137 mmol/L (ref 135–145)

## 2021-07-04 MED ORDER — AMLODIPINE BESYLATE 5 MG PO TABS: 10.0000 mg | ORAL_TABLET | Freq: Every day | ORAL | Status: DC

## 2021-07-04 MED ORDER — APIXABAN 5 MG PO TABS: 5.0000 mg | ORAL_TABLET | Freq: Two times a day (BID) | ORAL | Status: DC

## 2021-07-04 MED ORDER — METOCLOPRAMIDE HCL 10 MG PO TABS
5.0000 mg | ORAL_TABLET | Freq: Three times a day (TID) | ORAL | Status: DC
  Administered 2021-07-04 – 2021-07-06 (×6): 5 mg via ORAL
  Filled 2021-07-04 (×5): qty 1

## 2021-07-04 MED ORDER — PANTOPRAZOLE SODIUM 40 MG PO TBEC
40.0000 mg | DELAYED_RELEASE_TABLET | Freq: Two times a day (BID) | ORAL | Status: DC
  Administered 2021-07-04 – 2021-07-06 (×5): 40 mg via ORAL
  Filled 2021-07-04 (×5): qty 1

## 2021-07-04 MED ORDER — APIXABAN 5 MG PO TABS
10.0000 mg | ORAL_TABLET | Freq: Two times a day (BID) | ORAL | Status: DC
  Administered 2021-07-04 – 2021-07-06 (×5): 10 mg via ORAL
  Filled 2021-07-04 (×2): qty 2
  Filled 2021-07-04: qty 4
  Filled 2021-07-04 (×2): qty 2

## 2021-07-04 MED ORDER — LABETALOL HCL 200 MG PO TABS
100.0000 mg | ORAL_TABLET | Freq: Two times a day (BID) | ORAL | Status: DC
  Administered 2021-07-04 – 2021-07-06 (×5): 100 mg via ORAL
  Filled 2021-07-04 (×5): qty 1

## 2021-07-04 NOTE — Evaluation (Signed)
Physical Therapy Evaluation Patient Details Name: Debra Doyle MRN: [id number] DOB: [birth year] Today's Date: 07/04/2021   History of Present Illness  Debra Doyle  is a 87 y.o. female, with history of coronary artery disease, essential hypertension, fibrocystic breast disease, ischemic cardiomyopathy, myocardial infarction, and more presents the ED with a chief complaint of back pain.  Patient reports that her back has been hurting over the last few weeks.  It is worse with standing and with positional changes.  The intensity is severe.  She characterizes the pain as aching.  There is no radiation.  Tylenol has helped a little.  Right now her pain is 0 out of 10, as she is not moving.  But then she coughs, and then starts crying because the pain is so bad.  She also has a bloated abdomen.  This has been going on for 2 months.  It has been constant.  She has a normal appetite per her report, son at bedside reports that she eats very small meals.  Patient reports that she has been nauseous and vomiting/regurgitating.  She has had no hematemesis.  Her last bowel movement was 3 days ago.  Normal for her is to go every other day.  She reports drinking plenty of water each day.  Patient denies abdominal pain.  She has no other complaints at this time.   Clinical Impression  Patient demonstrates labored movement for sitting up at bedside requiring increased time due to c/o back pain/discomfort, unsteady on feet and able to ambulate up to doorway to transfer to gurney and limited mostly due to c/o fatigue.  Patient taken out for procedure after therapy.  Patient will benefit from continued physical therapy in hospital and recommended venue below to increase strength, balance, endurance for safe ADLs and gait.    Follow Up Recommendations Home health PT;Supervision for mobility/OOB;Supervision - Intermittent    Equipment Recommendations  None recommended by PT    Recommendations for Other Services        Precautions / Restrictions Precautions Precautions: Fall Restrictions Weight Bearing Restrictions: No      Mobility  Bed Mobility Overal bed mobility: Needs Assistance Bed Mobility: Rolling;Supine to Sit Rolling: Min assist   Supine to sit: Min assist     General bed mobility comments: increased time, labored movement    Transfers Overall transfer level: Needs assistance Equipment used: Rolling walker (2 wheeled) Transfers: Sit to/from [facility] Sit to Stand: Min assist Stand pivot transfers: Min assist       General transfer comment: slow labored movement with occasional rest breaks due to increasing low back pain  Ambulation/Gait Ambulation/Gait assistance: Mod assist Gait Distance (Feet): 20 Feet Assistive device: Rolling walker (2 wheeled) Gait Pattern/deviations: Decreased step length - left;Decreased stance time - right;Decreased stride length Gait velocity: decreased   General Gait Details: slow labored cadence with frequent drifting to the right, limited secondary to c/o fatigue  Stairs            Wheelchair Mobility    Modified Rankin (Stroke Patients Only)       Balance Overall balance assessment: Needs assistance Sitting-balance support: Feet supported;No upper extremity supported Sitting balance-Leahy Scale: Fair Sitting balance - Comments: fair/good seated at EOB   Standing balance support: During functional activity;Bilateral upper extremity supported Standing balance-Leahy Scale: Fair Standing balance comment: using RW  Pertinent Vitals/Pain Pain Assessment: Faces Faces Pain Scale: Hurts even more Pain Location: low back with movement Pain Descriptors / Indicators: Grimacing;Guarding;Moaning Pain Intervention(s): Limited activity within patient's tolerance;Monitored during session;Repositioned    Home Living Family/patient expects to be discharged to:: Private  residence Living Arrangements: Alone Available Help at Discharge: Family;Available 24 hours/day Type of Home: House Home Access: Ramped entrance     Home Layout: One level Home Equipment: Walker - 2 wheels;Walker - 4 wheels;Cane - single point;Bedside commode Additional Comments: patient assisted with bathing in bed    Prior Function Level of Independence: Needs assistance   Gait / Transfers Assistance Needed: household ambulator using RW or Rollator  ADL's / Homemaking Assistance Needed: assisted by family        Hand Dominance        Extremity/Trunk Assessment   Upper Extremity Assessment Upper Extremity Assessment: Generalized weakness    Lower Extremity Assessment Lower Extremity Assessment: Generalized weakness    Cervical / Trunk Assessment Cervical / Trunk Assessment: Kyphotic  Communication   Communication: No difficulties  Cognition Arousal/Alertness: Awake/alert Behavior During Therapy: Anxious Overall Cognitive Status: Within Functional Limits for tasks assessed                                        General Comments      Exercises     Assessment/Plan    PT Assessment Patient needs continued PT services  PT Problem List Decreased strength;Decreased activity tolerance;Decreased balance;Decreased mobility       PT Treatment Interventions DME instruction;Stair training;Gait training;Functional mobility training;Therapeutic activities;Therapeutic exercise;Patient/family education;Balance training    PT Goals (Current goals can be found in the Care Plan section)  Acute Rehab PT Goals Patient Stated Goal: return home with family to assist PT Goal Formulation: With patient/family Time For Goal Achievement: 07/11/21 Potential to Achieve Goals: Good    Frequency Min 3X/week   Barriers to discharge        Co-evaluation               AM-PAC PT "6 Clicks" Mobility  Outcome Measure Help needed turning from your back to  your side while in a flat bed without using bedrails?: A Little Help needed moving from lying on your back to sitting on the side of a flat bed without using bedrails?: A Little Help needed moving to and from a bed to a chair (including a wheelchair)?: A Little Help needed standing up from a chair using your arms (e.g., wheelchair or bedside chair)?: A Lot Help needed to walk in hospital room?: A Lot Help needed climbing 3-5 steps with a railing? : A Lot 6 Click Score: 15    End of Session   Activity Tolerance: Patient tolerated treatment well;Patient limited by fatigue Patient left: in bed;with nursing/sitter in room (Patient left on gurney to be taken to a procedure by nursing staff) Nurse Communication: Mobility status PT Visit Diagnosis: Unsteadiness on feet (R26.81);Other abnormalities of gait and mobility (R26.89);Muscle weakness (generalized) (M62.81)    Time: [id number] PT Time Calculation (min) (ACUTE ONLY): 27 min   Charges:   PT Evaluation $PT Eval Moderate Complexity: 1 Mod PT Treatments $Therapeutic Activity: 23-37 mins        2:15 PM, 07/04/21 Lonell Grandchild, MPT Physical Therapist with [facility] [phone] office [phone] mobile phone

## 2021-07-04 NOTE — Plan of Care (Signed)
  Problem: Acute Rehab PT Goals(only PT should resolve) Goal: Pt Will Go Supine/Side To Sit Outcome: Progressing Flowsheets (Taken 07/04/2021 1417) Pt will go Supine/Side to Sit:  with min guard assist  with minimal assist Goal: Patient Will Transfer Sit To/From Stand Outcome: Progressing Flowsheets (Taken 07/04/2021 1417) Patient will transfer sit to/from stand:  with minimal assist  with min guard assist Goal: Pt Will Transfer Bed To Chair/Chair To Bed Outcome: Progressing Flowsheets (Taken 07/04/2021 1417) Pt will Transfer Bed to Chair/Chair to Bed:  min guard assist  with min assist Goal: Pt Will Ambulate Outcome: Progressing Flowsheets (Taken 07/04/2021 1417) Pt will Ambulate:  50 feet  with minimal assist  with min guard assist  with rolling walker   2:18 PM, 07/04/21 Lonell Grandchild, MPT Physical Therapist with [facility] [phone] office [phone] mobile phone

## 2021-07-04 NOTE — Discharge Instructions (Signed)
Information on my medicine - ELIQUIS (apixaban)  This medication education was reviewed with me or my healthcare representative as part of my discharge preparation.     Why was Eliquis prescribed for you? Eliquis was prescribed to treat blood clots that may have been found in the veins of your legs (deep vein thrombosis) or in your lungs (pulmonary embolism) and to reduce the risk of them occurring again.  What do You need to know about Eliquis ? The starting dose is 10 mg (two 5 mg tablets) taken TWICE daily for the FIRST SEVEN (7) DAYS, then on 07/11/2021  the dose is reduced to ONE 5 mg tablet taken TWICE daily.  Eliquis may be taken with or without food.   Try to take the dose about the same time in the morning and in the evening. If you have difficulty swallowing the tablet whole please discuss with your pharmacist how to take the medication safely.  Take Eliquis exactly as prescribed and DO NOT stop taking Eliquis without talking to the doctor who prescribed the medication.  Stopping may increase your risk of developing a new blood clot.  Refill your prescription before you run out.  After discharge, you should have regular check-up appointments with your healthcare provider that is prescribing your Eliquis.    What do you do if you miss a dose? If a dose of ELIQUIS is not taken at the scheduled time, take it as soon as possible on the same day and twice-daily administration should be resumed. The dose should not be doubled to make up for a missed dose.  Important Safety Information A possible side effect of Eliquis is bleeding. You should call your healthcare provider right away if you experience any of the following: Bleeding from an injury or your nose that does not stop. Unusual colored urine (red or dark brown) or unusual colored stools (red or black). Unusual bruising for unknown reasons. A serious fall or if you hit your head (even if there is no bleeding).  Some  medicines may interact with Eliquis and might increase your risk of bleeding or clotting while on Eliquis. To help avoid this, consult your healthcare provider or pharmacist prior to using any new prescription or non-prescription medications, including herbals, vitamins, non-steroidal anti-inflammatory drugs (NSAIDs) and supplements.  This website has more information on Eliquis (apixaban): http://www.eliquis.com/eliquis/home

## 2021-07-04 NOTE — TOC Transition Note (Signed)
Transition of Care [facility]) - CM/SW Discharge Note   Patient Details  Name: Debra Doyle MRN: [id number] Date of Birth: 1929-10-28  Transition of Care [facility]) CM/SW Contact:  Ihor Gully, LCSW Phone Number: 07/04/2021, 12:54 PM   Clinical Narrative:    PT recommends HHPT. [facility] accepting of patient's insurance and accepts HHPT referral.    Final next level of care: [place] Barriers to Discharge: Continued Medical Work up   Patient Goals and CMS Choice        Discharge Placement                       Discharge Plan and Services                          HH Arranged: PT [facility] Agency: [place] ([place]) Date [place]: 07/04/21 Time [place]: 1253 Representative spoke with at [place]: [place] ([place]) Interventions     Readmission Risk Interventions No flowsheet data found.

## 2021-07-04 NOTE — Progress Notes (Signed)
Subjective: Feeling okay today.  Denies abdominal pain, nausea, vomiting.  Reports her abdomen is still distended, but improving.  Son is at bedside and states he looks much better since admission.  She is tolerating clear liquids well.  For documented bowel movement yesterday.  No BRBPR or melena per nursing staff.  She is currently on 2 L nasal cannula which is new.  Patient reports intermittent shortness of breath and some increased cough this admission. Nurse states she was hollering about shortness of breath last night. They repositioned her but this didn't relieve her symptoms, so she was started on 2L.   Denies NSAIDs aside from daily 81 mg aspirin.   Objective: Vital signs in last 24 hours: Temp:  [98 F (36.7 C)-99.2 F (37.3 C)] 98 F (36.7 C) (08/22 0314) Pulse Rate:  [72-90] 90 (08/22 0851) Resp:  [16-20] 20 (08/22 0314) BP: (129-168)/(85-106) 129/92 (08/22 0851) SpO2:  [96 %-97 %] 96 % (08/22 0314) Last BM Date: 07/04/21 General:   Alert and oriented, pleasant, no acute distress.  Nasal cannula in place.  On 2 L. Head:  Normocephalic and atraumatic. Eyes:  No icterus, sclera clear. Conjuctiva pink.  Abdomen:  Bowel sounds present, soft, non-tender, remains distended but somewhat improved compared to when I saw her last (8/19). No HSM or hernias noted. No rebound or guarding. No masses appreciated  Extremities:  With unilateral left arm pitting edema.  Trace bilateral lower extremity pitting edema. Neurologic:  Alert and  oriented x4;  grossly normal neurologically. Psych: Normal mood and affect.  Intake/Output from previous day: 08/21 0701 - 08/22 0700 In: 180 [P.O.:180] Out: 875 [Urine:875] Intake/Output this shift: Total I/O In: 120 [P.O.:120] Out: -   Lab Results: No results for input(s): WBC, HGB, HCT, PLT in the last 72 hours. BMET Recent Labs    07/02/21 0745 07/03/21 0553 07/04/21 0524  NA 138 137 137  K 4.3 4.1 3.6  CL 112* 112* 110  CO2 23 21*  20*  GLUCOSE 155* 129* 133*  BUN 6* 6* 8  CREATININE 0.67 0.61 0.56  CALCIUM 8.0* 8.0* 8.3*    Assessment: 86 year old female with multiple comorbidities including coronary artery disease status post PCI with stent placement, prior myocardial infarction, ischemic cardiomyopathy, hypertension, who was admitted to the hospital after presenting worsening abdominal pain and distention, nausea and vomiting.  There was presence of large distention of the gastric chamber and questionable narrowing at the rectosigmoid junction with presence of a rectal stool ball.  She had fecal disimpaction performed but without any improvement so an NG tube was placed and drainage has been minimal recently.  Due to the concern for possible partial obstruction, an EGD and flexible sigmoidoscopy were performed that showed a nonbleeding gastric ulcer with normal histology negative for H. pylori and normal duodenum without presence of any obstructive lesions.  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 No obstructive lesions have been found.  Overall, clinical picture is more consistent with generalized dysmotility of the gastrointestinal system, likely worsened by poor mobility.  She started on low-dose Reglan trial (tolerating well) and has been on MiraLAX 3 times daily and Senokot twice daily with improvement in bowel movements.  Abdominal distention still present, but improved since admission.  NG tube removed on 8/20 and started on clear liquids at that time as well which  she has been tolerating. Last abdominal x-ray was 8/19 with findings compatible with global ileus. As she has had clinical improvement, will repeat abdominal x-ray to evaluate for improvement. Consider advancing to full liquids today.   Regarding rectal mass and colon polyps, as there are been no obstructing  lesions found, no need for inpatient colonoscopy.  Could consider outpatient colonoscopy.  Patient and son are discussing risk/benefits giving her age, comorbidities, and life expectancy.  Can reassess outpatient.  PUD: Nonbleeding gastric ulcer with normal histology, negative for H. pylori on EGD this admission.  Patient denies NSAID use aside from daily 81 mg aspirin.  Currently on PPI twice daily which will be continued.  Shortness of breath: Patient reports some intermittent shortness of breath and mild increased cough this admission.  She was placed on 2 L nasal cannula last night due to feeling short of breath.  Chest x-ray on 8/19 with increased consolidative change at the left lung base in the retrocardiac region possibly reflecting developing pneumonia versus volume loss with pleural effusion.  I have notified hospitalist who will order repeat chest x-ray.  Left upper extremity pitting edema: Noted unilateral pitting edema of the left upper extremity.  Notified hospitalist.  Suspect this is to infiltration of IV site, but plans to order ultrasound.   Plan: Repeat DG abdomen today. Consider advancing to full liquid diet today. Continue Reglan 5 mg 3 times daily. Continue MiraLAX 3 times daily. Continue Senokot twice daily. Reinforced with patient the need for frequent mobilization in her bed as well as with walker as she is able.  Reinforced with nursing staff as well. Minimize opiate/constipating medications. Continue PPI twice daily. Notified hospitalist about shortness of breath.  Notified hospitalist about unilateral left upper extremity edema.   LOS: 10 days    07/04/2021, 10:06 AM   Aliene Altes, [facility]

## 2021-07-04 NOTE — Plan of Care (Signed)
  Problem: Education: Goal: Knowledge of General Education information will improve Description: Including pain rating scale, medication(s)/side effects and non-pharmacologic comfort measures Outcome: Progressing   Problem: Health Behavior/Discharge Planning: Goal: Ability to manage health-related needs will improve Outcome: Progressing   Problem: Clinical Measurements: Goal: Diagnostic test results will improve Outcome: Progressing   

## 2021-07-04 NOTE — Progress Notes (Signed)
ANTICOAGULATION CONSULT NOTE - Initial Consult  Pharmacy Consult for apixaban Indication: DVT  No Known Allergies  Patient Measurements: Height: '5\' 1"'$  (154.9 cm) Weight: 51.7 kg (114 lb) IBW/kg (Calculated) : 47.8 Heparin Dosing Weight:   Vital Signs: Temp: 98 F (36.7 C) (08/22 0314) Temp Source: Oral (08/22 0314) BP: 129/92 (08/22 0851) Pulse Rate: 90 (08/22 0851)  Labs: Recent Labs    07/02/21 0745 07/03/21 0553 07/04/21 0524  HGB  --   --  11.1*  HCT  --   --  33.3*  PLT  --   --  188  CREATININE 0.67 0.61 0.56    Estimated Creatinine Clearance: 35.3 mL/min (by C-G formula based on SCr of 0.56 mg/dL).   Medical History: Past Medical History:  Diagnosis Date   Coronary atherosclerosis of native coronary artery    a. s/p DES x4 to LAD in 2012, ISR in 11/2013 with thrombectomy and angioplasty alone of LAD   Essential hypertension    Fibrocystic breast disease    Hx of colonic polyps    Ischemic cardiomyopathy    Myocardial infarction [facility])    NSTEMI 1/12   ST elevation (STEMI) myocardial infarction involving left anterior descending coronary artery [facility])    January 2015 - LAD stent thrombosis off DAPT    Medications:  Medications Prior to Admission  Medication Sig Dispense Refill Last Dose   acetaminophen (TYLENOL) 325 MG tablet Take 2 tablets (650 mg total) by mouth every 6 (six) hours as needed for fever or headache (pain).      Ascorbic Acid (VITAMIN C) 500 MG tablet Take 500 mg by mouth daily.   Past Week   aspirin EC 81 MG tablet Take 1 tablet (81 mg total) by mouth daily.   06/24/2021 at 0800   atorvastatin (LIPITOR) 40 MG tablet TAKE 1 TABLET BY MOUTH EVERY DAY AT 6:00 PM (Patient taking differently: Take 40 mg by mouth daily.) 90 tablet 1 06/24/2021   carvedilol (COREG) 6.25 MG tablet TAKE 1 TABLET BY MOUTH TWICE DAILY 180 tablet 1 06/24/2021 at 0800   cyanocobalamin 1000 MCG tablet Take 1,000 mcg by mouth daily.   Past Week   furosemide (LASIX) 40 MG  tablet Take 40 mg by mouth daily.    06/24/2021   ketoconazole (NIZORAL) 2 % cream Apply 1 application topically 2 (two) times daily.      losartan (COZAAR) 50 MG tablet TAKE 1 TABLET BY MOUTH EVERY DAY 90 tablet 3 06/24/2021   LUTEIN PO Take 1 capsule by mouth daily.   Past Week   nitroGLYCERIN (NITROSTAT) 0.4 MG SL tablet Place 0.4 mg under the tongue as needed for chest pain. Place one tablet under the tongue every 5 minutes x 3 doses max as needed for chest pain.  Contact MD or 911 with unrelieved pain      pantoprazole (PROTONIX) 40 MG tablet Take 40 mg by mouth daily.   06/28/2021   senna (SENOKOT) 8.6 MG TABS tablet Take 1 tablet by mouth daily as needed for mild constipation.   06/24/2021   Specialty Vitamins Products (BILBERRY PLUS PO) Take 1 capsule by mouth daily.   Past Week   spironolactone (ALDACTONE) 25 MG tablet TAKE 1/2 TABLET BY MOUTH EVERY DAY 45 tablet 1 06/24/2021   Zinc Oxide 10 % OINT Apply 1 application topically daily as needed (buttocks).       Assessment: Pharmacy consulted to dose Eliquis in patient with newly diagnosed DVT of left upper extremity.  Patient is not on anticoagulation prior to admission.  Goal of Therapy:   Monitor platelets by anticoagulation protocol: Yes   Plan:  Apixaban 10 mg twice daily x 7 days followed by apixaban 5 mg twice daily. Monitor labs and s/s of bleeding.  Ramond Craver 07/04/2021,2:01 PM

## 2021-07-04 NOTE — Progress Notes (Signed)
Radiology called to report DVT to Left cephalic and basilic veins.  Provider notified.  Awaiting orders.

## 2021-07-04 NOTE — Consult Note (Signed)
Consultation Note Date: 07/04/2021   Patient Name: Debra Doyle  DOB: Jun 09, 1929  MRN: [id number]  Age / Sex: 87 y.o., female  PCP: [name], MD Referring Physician: Deatra James, MD  Reason for Consultation: Establishing goals of care  HPI/Patient Profile: 87 y.o. female  with past medical history of CAD, HTN, ischemic cardiomyopathy, history of MI/NSTEMI 2012, STEMI 2015, kyphosis, fibrocystic breast disease, admitted on 06/24/2021 with ABD pain, KUB consistent with ileus.   Clinical Assessment and Goals of Care: I have reviewed medical records including EPIC notes, labs and imaging, received report from RN, assessed the patient.  Debra Doyle is lying in bed with nursing staff and PT at bedside.  She has had another BM, and they are assisting with mobility and skin care.  Debra Doyle is alert and oriented x3, able to make her basic needs known.  Her son and caregiver are outside while she is being cleaned.  Somewhat limited discussion with Debra Doyle today as she is going to radiology for testing.   Conference with son, [name] and caregiver Vaughan Basta to discuss diagnosis prognosis, GOC, EOL wishes, disposition and options.  I introduced Palliative Medicine as specialized medical care for people living with serious illness. It focuses on providing relief from the symptoms and stress of a serious illness. The goal is to improve quality of life for both the patient and the family.  We discussed a brief life review of the patient.  Son Shanon Brow has lived with Debra Doyle for approximately 9 years, when her spouse died.  Shanon Brow lives next door and assist with meals.  Prior to this illness Debra Doyle was able to toilet herself, but had assistance with bathing 2 or 3 times a week.    We then focused on their current illness.  Caregiver Vaughan Basta frequently asks about UTI and pain in Debra Doyle back.  We talked  about her kyphosis, spinal abnormalities.  Shanon Brow feels that she is having pain because of being in the bed for several days.  The natural disease trajectory and expectations at EOL were discussed.  We talked about ileus and bowel movements with further testing, PT recommendations for home health, and time for outcomes.  We talked about the mass in Debra Doyle rectum, and Shanon Brow shares that they have decided to not seek further treatment and certainly would not accept surgery.  Hospice and Palliative Care services outpatient were explained and offered.  Son and caregiver are open to outpatient palliative services.  They are also agreeable to "treat the treatable" hospice care, but this needs to be discussed with Mrs. Kuss prior to referral.  Discussed the importance of continued conversation with family and the medical providers regarding overall plan of care and treatment options, ensuring decisions are within the context of the patient's values and GOCs.  Questions and concerns were addressed.  The family was encouraged to call with questions or concerns.  PMT will continue to support holistically.  Conference with attending, bedside nursing staff, transition of care team  related to patient condition, needs, goals of care.   HCPOA   NEXT OF KIN -Debra Doyle is oriented enough to make her own choices, son, Shanon Brow finger is Air traffic controller.  She also has a son Kasandra Knudsen, and another son who is unnamed, who lives in another state.    SUMMARY OF RECOMMENDATIONS   At this point continue to treat the treatable No surgeries Home with home health Outpatient palliative to follow  Code Status/Advance Care Planning: DNR  Symptom Management:  Per hospitalist, no additional needs at this time.  Palliative Prophylaxis:  Frequent Pain Assessment and Turn Reposition  Additional Recommendations (Limitations, Scope, Preferences): Continue to treat the treatable but no CPR or intubation.   Declines surgery at this point  Psycho-social/Spiritual:  Desire for further Chaplaincy support:no Additional Recommendations: Caregiving  Support/Resources and Education on Hospice  Prognosis:  Unable to determine, based on outcomes.  6 months or less would not be surprising based on current functional status, frailty.  Discharge Planning:  Home with home health and outpatient palliative.       Primary Diagnoses: Present on Admission:  Gastric distention  AKI (acute kidney injury) ([place])   I have reviewed the medical record, interviewed the patient and family, and examined the patient. The following aspects are pertinent.  Past Medical History:  Diagnosis Date   Coronary atherosclerosis of native coronary artery    a. s/p DES x4 to LAD in 2012, ISR in 11/2013 with thrombectomy and angioplasty alone of LAD   Essential hypertension    Fibrocystic breast disease    Hx of colonic polyps    Ischemic cardiomyopathy    Myocardial infarction [facility])    NSTEMI 1/12   ST elevation (STEMI) myocardial infarction involving left anterior descending coronary artery [facility])    January 2015 - LAD stent thrombosis off DAPT   Social History   Socioeconomic History   Marital status: Widowed    Spouse name: Not on file   Number of children: Not on file   Years of education: Not on file   Highest education level: Not on file  Occupational History   Occupation: Retired    Fish farm manager: RETIRED  Tobacco Use   Smoking status: Never   Smokeless tobacco: Never  Vaping Use   Vaping Use: Never used  Substance and Sexual Activity   Alcohol use: No   Drug use: No   Sexual activity: Not on file  Other Topics Concern   Not on file  Social History Narrative   Not on file   Social Determinants of Health   Financial Resource Strain: Not on file  Food Insecurity: Not on file  Transportation Needs: Not on file  Physical Activity: Not on file  Stress: Not on file  Social Connections: Not on file    Family History  Problem Relation Age of Onset   Coronary artery disease Mother    Emphysema Father    Cancer Sister        1 sister/cancer type unknown   Stroke Brother        1 brother   Scheduled Meds:  Chlorhexidine Gluconate Cloth  6 each Topical Daily   labetalol  100 mg Oral BID   lidocaine  1 patch Transdermal Q24H   metoCLOPramide  5 mg Oral TID AC   pantoprazole  40 mg Oral BID   polyethylene glycol  17 g Oral TID   senna-docusate  2 tablet Oral BID   sodium phosphate  1 enema Rectal  Once   Continuous Infusions: PRN Meds:.acetaminophen **OR** acetaminophen, benzonatate, hydrALAZINE, ondansetron **OR** ondansetron (ZOFRAN) IV, phenol Medications Prior to Admission:  Prior to Admission medications   Medication Sig Start Date End Date Taking? Authorizing Provider  acetaminophen (TYLENOL) 325 MG tablet Take 2 tablets (650 mg total) by mouth every 6 (six) hours as needed for fever or headache (pain). 01/31/19  Yes Barton Dubois, MD  Ascorbic Acid (VITAMIN C) 500 MG tablet Take 500 mg by mouth daily.   Yes [provider]  aspirin EC 81 MG tablet Take 1 tablet (81 mg total) by mouth daily. 02/04/19  Yes Barton Dubois, MD  atorvastatin (LIPITOR) 40 MG tablet TAKE 1 TABLET BY MOUTH EVERY DAY AT 6:00 PM Patient taking differently: Take 40 mg by mouth daily. 02/08/21  Yes Satira Sark, MD  carvedilol (COREG) 6.25 MG tablet TAKE 1 TABLET BY MOUTH TWICE DAILY 02/08/21  Yes Satira Sark, MD  cyanocobalamin 1000 MCG tablet Take 1,000 mcg by mouth daily.   Yes [provider]  furosemide (LASIX) 40 MG tablet Take 40 mg by mouth daily.  02/10/19  Yes [provider]  ketoconazole (NIZORAL) 2 % cream Apply 1 application topically 2 (two) times daily. 06/09/21  Yes [provider]  losartan (COZAAR) 50 MG tablet TAKE 1 TABLET BY MOUTH EVERY DAY 11/16/20  Yes Satira Sark, MD  LUTEIN PO Take 1 capsule by mouth daily.   Yes [provider]  nitroGLYCERIN (NITROSTAT) 0.4 MG SL tablet Place 0.4 mg under the tongue as needed for chest pain. Place one tablet under the tongue every 5 minutes x 3 doses max as needed for chest pain.  Contact MD or 911 with unrelieved pain   Yes [provider]  pantoprazole (PROTONIX) 40 MG tablet Take 40 mg by mouth daily.   Yes [provider]  senna (SENOKOT) 8.6 MG TABS tablet Take 1 tablet by mouth daily as needed for mild constipation.   Yes [provider]  Specialty Vitamins Products (BILBERRY PLUS PO) Take 1 capsule by mouth daily.   Yes [provider]  spironolactone (ALDACTONE) 25 MG tablet TAKE 1/2 TABLET BY MOUTH EVERY DAY 02/08/21  Yes Satira Sark, MD  Zinc Oxide 10 % OINT Apply 1 application topically daily as needed (buttocks).   Yes [provider]   No Known Allergies Review of Systems  Unable to perform ROS: Age   Physical Exam Vitals and nursing note reviewed.  Constitutional:      General: She is not in acute distress.    Appearance: She is ill-appearing.  Cardiovascular:     Rate and Rhythm: Normal rate.  Pulmonary:     Effort: Pulmonary effort is normal. No respiratory distress.  Skin:    General: Skin is warm and dry.  Neurological:     Mental Status: She is alert and oriented to person, place, and time.  Psychiatric:        Mood and Affect: Mood normal.        Behavior: Behavior normal.    Vital Signs: BP (!) 129/92   Pulse 90   Temp 98 F (36.7 C) (Oral)   Resp 20   Ht '5\' 1"'$  (1.549 m)   Wt 51.7 kg   SpO2 96%   BMI 21.54 kg/m  Pain Scale: 0-10   Pain Score: 0-No pain   SpO2: SpO2: 96 % O2 Device:SpO2: 96 % O2 Flow Rate: .O2 Flow Rate (L/min): 10 L/min  IO: Intake/output summary:  Intake/Output Summary (Last 24 hours) at 07/04/2021 1345 Last data filed at 07/04/2021 0900 Gross per 24 hour  Intake 120 ml  Output 875 ml  Net -755 ml    LBM: Last BM Date: 07/04/21 Baseline Weight:  Weight: 51.7 kg Most recent weight: Weight: 51.7 kg     Palliative Assessment/Data:   Flowsheet Rows    Flowsheet Row Most Recent Value  Intake Tab   Referral Department Hospitalist  Unit at Time of Referral Cardiac/Telemetry Unit  Palliative Care Primary Diagnosis Other (Comment)  Date Notified 07/03/21  Palliative Care Type New Palliative care  Reason for referral Clarify Goals of Care  Date of Admission 06/24/21  Date first seen by Palliative Care 07/04/21  # of days Palliative referral response time 1 Day(s)  # of days IP prior to Palliative referral 9  Clinical Assessment   Palliative Performance Scale Score 40%  Pain Max last 24 hours Not able to report  Pain Min Last 24 hours Not able to report  Dyspnea Max Last 24 Hours Not able to report  Dyspnea Min Last 24 hours Not able to report  Psychosocial & Spiritual Assessment   Palliative Care Outcomes        Time In: 1020 Time Out: 1130 Time Total: 70 minutes  Greater than 50%  of this time was spent counseling and coordinating care related to the above assessment and plan.  Signed by: Drue Novel, NP   Please contact Palliative Medicine Team phone at [phone] for questions and concerns.  For individual provider: See Shea Evans

## 2021-07-04 NOTE — Progress Notes (Signed)
Patient Demographics:    Debra Doyle, is a 87 y.o. female, DOB - May 26, 1929, [id number]  Admit date - 06/24/2021   Admitting Physician [name], MD  Outpatient Primary MD for the patient is [name], MD  LOS - 10  CC: Abdominal pain,   Subjective:   The patient was seen and examined this morning, still reluctant to get out of bed ambulate... Declines any discussion regarding placement stating she would like to return home where she lives alone.  Noted for left arm swelling but no erythema edema or pain Chest x-ray questionable possible infiltrate but afebrile normotensive with no leukocytosis     Assessment  & Plan :    Principal Problem:   Gastric distention Active Problems:   AKI (acute kidney injury) (HCC)   Hyponatremia   Mild protein-calorie malnutrition (HCC)   Hypotension   Abdominal distension   Rectal mass   Constipation   Shortness of breath   Ileus, unspecified (HCC)   Brief Summary:- 87 y.o. female, with history of coronary artery disease/Prior MI/ischemic cardiomyopathy and HTN--admitted on 06/25/2021 with abdominal pain nausea and vomiting and found to have significant gastric distention on CT abdomen and pelvis   Assessment/plan:   Abdominal pain with Nausea and Vomiting -abdominal distention -Improved abdominal pain, nausea vomiting, abdomen remains soft, hypoactive but positive bowel sounds, distended  -Reporting 4 stools past 24 hours  -KUB consistent with ileus passing gas and has had BMs, no signs of obstruction - on scheduled p.o. Reglan  - Gasteric Ulcer/5 cm anal mass - significant gastric distention on CT abdomen and Pelvis.   Revealing no retrosigmoid obstruction with rectal mass -NG tube replaced on 06/26/21 -  D/Ced 07/02/2021 -As p.o. intake improves continue on clear liquid diet, gentle IV fluid hydration and changing PPI to p.o. -Continue  MiraLAX, ambulation, following imaging, as needed Reglan  Per surgery: -No surgical intervention, she can proceed with colonoscopy once rectal polyp was removed per general surgery - per GI:  colonoscopy as an outpatient   5 cm anal mass - EGD and colonoscopy on EGD and flex sig- - EGD: Nonbleeding Gastric ulcer, --Flex sig shows lower: anal mass 5 cm from anal verge --- status postbiopsy pending pathology  -Biopsy results --Path report -benign nonmalignant mass   Short segment area of Narrowing  S/p EGD and flex sig on 8/16/2022g at the Rectosigmoid junction with a moderately dilated distal sigmoid colon--- rectal stool ball noted on CT abdomen and pelvis as well  -At this point profound stricture is ruled out with CT scan with contrast-  -Malignancy of the mass has been ruled out -GI recommending colonoscopy as an outpatient in the future   -Continue laxatives --- Surgery following peripherally    Left arm edema  -IV site infiltrated, obtaining ultrasound to rule out DVT   Dehydration/AKI/HypoMagnesiemia/HypoKalemia/HypoNatremia-- -Due to poor p.o. intake, nausea vomiting-improved with treatment electrolytes and IV fluids - Na 124 >>>137, 139 -Resolved but continue monitoring   4)Transiet Hypotension- -Resolved, sepsis ruled out AM Cortisol is 26.2, with no leukocytosis PCT is 0.23 Lactic acid 2.8 >>1.5 (lactic acidosis may be due to dehydration and poor tissue perfusion) -Discontinued vancomycin, cefepime and Flagyl   5) Hypertension -Discontinued Norvasc and Coreg, initiating labetalol 100  mg p.o. twice daily -Monitoring and titrating medication accordingly -Remained stable  Mild respite distress -On 2 L of oxygen, satting 96% -Chest x-ray questionable possible infiltrate but afebrile normotensive with no leukocytosis Chest x-ray may be obscured due to abdominal distention, patient leaning forward unable to obtain a PA lateral-as patient not able to completely set  up -Obtaining another chest x-ray today -No leukocytosis, afebrile, abstaining from antibiotics      Disposition/Need for in-Hospital Stay- patient unable to be discharged at this time due to  -requiring IV fluids until able to tolerate oral intake, likely have a stricture due to rectal mass, needing further evaluation from GI and possibly surgery    Ethics -DNR/DNI Prognosis poor due to age, multiple comorbidities, decreased mobility decreased p.o. intake continue GI issues Discussed with family, his son on 07/03/2021   Status is: Inpatient  Remains inpatient appropriate because: Please see disposition above  Disposition: The patient is from: Home              Anticipated d/c is to: Home with HH Vs SNF              Anticipated d/c date is: 2 days              Patient currently is not medically stable to d/c. Barriers: Not Clinically Stable-   Code Status :  -  Code Status: DNR   Family Communication:   (patient is alert, awake and coherent)  Son [name] at [phone] -discussed with son and daughter-in-law regarding the findings, steady decline refusing to participate in any physical poor p.o. intake, consistent abdominal pain, distention, multiple comorbidities ... they Agreed to palliative care consultation with goal of hospice Consults  :  Gi/surg.   DVT Prophylaxis  :   - SCDs   Place and maintain sequential compression device Start: 06/27/21 2139 SCDs Start: 06/24/21 2330    Lab Results  Component Value Date   PLT 165 06/29/2021    Inpatient Medications  Scheduled Meds:  Chlorhexidine Gluconate Cloth  6 each Topical Daily   labetalol  100 mg Oral BID   lidocaine  1 patch Transdermal Q24H   metoCLOPramide  5 mg Oral TID AC   pantoprazole  40 mg Oral BID   polyethylene glycol  17 g Oral TID   senna-docusate  2 tablet Oral BID   sodium phosphate  1 enema Rectal Once   Continuous Infusions:   PRN Meds:.acetaminophen **OR** acetaminophen, benzonatate,  hydrALAZINE, ondansetron **OR** ondansetron (ZOFRAN) IV, phenol    Anti-infectives (From admission, onward)    Start     Dose/Rate Route Frequency Ordered Stop   06/25/21 1800  ceFEPIme (MAXIPIME) 2 g in sodium chloride 0.9 % 100 mL IVPB  Status:  Discontinued        2 g 200 mL/hr over 30 Minutes Intravenous Every 24 hours 06/24/21 1851 06/27/21 1255   06/25/21 1800  vancomycin (VANCOREADY) IVPB 500 mg/100 mL        500 mg 100 mL/hr over 60 Minutes Intravenous Every 24 hours 06/24/21 1856 06/25/21 2359   06/24/21 1830  ceFEPIme (MAXIPIME) 2 g in sodium chloride 0.9 % 100 mL IVPB        2 g 200 mL/hr over 30 Minutes Intravenous  Once 06/24/21 1829 06/24/21 2118   06/24/21 1830  metroNIDAZOLE (FLAGYL) IVPB 500 mg  Status:  Discontinued        500 mg 100 mL/hr over 60 Minutes Intravenous Every 8 hours 06/24/21  1829 06/27/21 1255   06/24/21 1830  vancomycin (VANCOCIN) IVPB 1000 mg/200 mL premix        1,000 mg 200 mL/hr over 60 Minutes Intravenous  Once 06/24/21 1829 06/24/21 2118         Objective:   Vitals:   07/03/21 1724 07/03/21 2113 07/04/21 0314 07/04/21 0851  BP: (!) 156/93 (!) 155/91 (!) 168/106 (!) 129/92  Pulse: 74 72 86 90  Resp:  18 20   Temp:  98 F (36.7 C) 98 F (36.7 C)   TempSrc:  Oral Oral   SpO2:  97% 96%   Weight:      Height:        Wt Readings from Last 3 Encounters:  06/28/21 51.7 kg  03/31/21 51.7 kg  01/29/20 56.7 kg     Intake/Output Summary (Last 24 hours) at 07/04/2021 1050 Last data filed at 07/04/2021 0900 Gross per 24 hour  Intake 180 ml  Output 875 ml  Net -695 ml      Physical Exam:   General:  Alert, oriented, cooperative, no distress;   HEENT:  Normocephalic, PERRL, otherwise with in Normal limits   Neuro:  CNII-XII intact. , normal motor and sensation, reflexes intact   Lungs:   Clear to auscultation BL, Respirations unlabored, no wheezes / crackles  Cardio:    S1/S2, RRR, No murmure, No Rubs or Gallops   Abdomen:    Abdominal distention, soft, non-tender, bowel sounds active all four quadrants,  no guarding or peritoneal signs.  Muscular skeletal:  Severe global generalized weaknesses Limited exam - in bed, able to move all 4 extremities, Normal strength,  2+ pulses,  symmetric,  Left arm edema  Skin:  Dry, warm to touch, negative for any Rashes,  Wounds: Please see nursing documentation  Pressure Injury 06/28/21 Coccyx Mid Deep Tissue Pressure Injury - Purple or maroon localized area of discolored intact skin or blood-filled blister due to damage of underlying soft tissue from pressure and/or shear. (Active)  06/28/21 2030  Location: Coccyx  Location Orientation: Mid  Staging: Deep Tissue Pressure Injury - Purple or maroon localized area of discolored intact skin or blood-filled blister due to damage of underlying soft tissue from pressure and/or shear.  Wound Description (Comments):   Present on Admission: Yes                  Data Review:   Micro Results Recent Results (from the past 240 hour(s))  Blood Culture (routine x 2)     Status: None   Collection Time: 06/24/21  6:35 PM   Specimen: Right Antecubital; Blood  Result Value Ref Range Status   Specimen Description RIGHT ANTECUBITAL  Final   Special Requests   Final    BOTTLES DRAWN AEROBIC AND ANAEROBIC Blood Culture adequate volume   Culture   Final    NO GROWTH 5 DAYS Performed at [facility], [address], [place], [place] [address]    Report Status 06/29/2021 FINAL  Final  Blood Culture (routine x 2)     Status: None   Collection Time: 06/24/21  6:42 PM   Specimen: Left Antecubital; Blood  Result Value Ref Range Status   Specimen Description LEFT ANTECUBITAL  Final   Special Requests   Final    BOTTLES DRAWN AEROBIC AND ANAEROBIC Blood Culture adequate volume   Culture   Final    NO GROWTH 5 DAYS Performed at [facility], [address], [place], [place] [address]  Report Status 06/29/2021 FINAL  Final  SARS  CORONAVIRUS 2 (TAT 6-24 HRS) Nasopharyngeal Nasopharyngeal Swab     Status: None   Collection Time: 06/25/21  1:52 AM   Specimen: Nasopharyngeal Swab  Result Value Ref Range Status   SARS Coronavirus 2 NEGATIVE NEGATIVE Final    Comment: (NOTE) SARS-CoV-2 target nucleic acids are NOT DETECTED.  The SARS-CoV-2 RNA is generally detectable in upper and lower respiratory specimens during the acute phase of infection. Negative results do not preclude SARS-CoV-2 infection, do not rule out co-infections with other pathogens, and should not be used as the sole basis for treatment or other patient management decisions. Negative results must be combined with clinical observations, patient history, and epidemiological information. The expected result is Negative.  Fact Sheet for Patients: SugarRoll.be  Fact Sheet for Healthcare Providers: https://www.woods-mathews.com/  This test is not yet approved or cleared by the [place] FDA and  has been authorized for detection and/or diagnosis of SARS-CoV-2 by FDA under an Emergency Use Authorization (EUA). This EUA will remain  in effect (meaning this test can be used) for the duration of the COVID-19 declaration under Se ction 564(b)(1) of the Act, 21 U.S.C. section 360bbb-3(b)(1), unless the authorization is terminated or revoked sooner.  Performed at [facility], [place] [address] [place], [place] [address]     Radiology Reports CT ABDOMEN PELVIS WO CONTRAST  Result Date: 06/30/2021 CLINICAL DATA:  Abdominal distension, rectal mass on sigmoidoscopy performed yesterday. EXAM: CT ABDOMEN AND PELVIS WITHOUT CONTRAST TECHNIQUE: Multidetector CT imaging of the abdomen and pelvis was performed following the standard protocol without IV contrast. COMPARISON:  Multiple exams, including 06/24/2021 FINDINGS: Lower chest: Small bilateral pleural effusions with atelectasis in both lower lobes and mild  atelectasis in the right middle lobe and lingula. Clips are noted along the upper glandular tissues of the right breast. Coronary atherosclerosis most notable in the left anterior descending coronary artery. Descending thoracic aortic atherosclerosis. Hepatobiliary: Poor visualization of the gallbladder. Otherwise unremarkable. Pancreas: Unremarkable Spleen: Unremarkable Adrenals/Urinary Tract: Foley catheter in the otherwise empty urinary bladder. No hydronephrosis or hydroureter. Normal renal contours. No renal calculi observed. Stomach/Bowel: A nasogastric tube terminates in the stomach body. There less distention of the stomach than on the prior exam. Increased thickening of the gastric wall probably related to the lesser degree of distension compared to previous. A rectal tube is in place and contrast was administered via the rectal tube. 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 The rectum is fairly distended (possibly due to contrast filling), there is transition to smaller caliber at the rectosigmoid junction which partially fills with contrast medium for example on image 54 series 2. There is formed stool in the sigmoid colon along with various diverticular compatible with diverticulosis. The amount of formed stool in the colon is pronounced compatible with constipation and there is likely some redundancy of the sigmoid colon which is otherwise difficult to follow due to the paucity of intra-adipose tissue in this patient which makes separation of bowel loops difficult. The transverse colon does course down into the left lower quadrant and then back up towards the spleen, compatible with redundant transverse colon. No overtly dilated small bowel loops are identified. There is stranding in the perirectal space for example on image 65 of  series 2 which may indicate inflammation  or third spacing of fluid, some of this was also present on 06/24/2021. There is no extraluminal gas or substantial perirectal hematoma identified. Soft tissue density anterior to the rectum is compatible with uterine tissues. Vascular/Lymphatic: Aortoiliac atherosclerotic vascular disease. Reproductive: Uterus visualized although the contour is fairly indistinct. Other: There is potentially low-level mesenteric edema as well. Edema tracks into the left upper thigh region anteriorly and along the pubis. Musculoskeletal: There is a transverse fracture of the lower sternum on image 67 series 6. Age indeterminate nondisplaced rib fractures of the left fifth through tenth ribs. Stable prominent compression fracture at T10 and stable mild endplate compression fractures at L2 and along the superior endplate of L3. Stable mild superior endplate compression fracture at L4. IMPRESSION: 1. Interval decompression of the stomach with a nasogastric tube. 2. Rectal contrast was administered and L lined some anterior rectal irregularity probably corresponding to the biopsy tumor. 3. Prominent stool throughout the colon favors constipation. Redundant sigmoid colon and redundant transverse colon. 4. There is some increased presacral and perirectal stranding although this may be part of the or widespread third spacing of fluid process with small bilateral pleural effusions, bilateral subcutaneous edema, and mild mesenteric edema. 5. No findings of extraluminal gas or obvious abscess. Paucity of intra-adipose tissue makes separating adjacent bowel loops difficult. 6. Bibasilar atelectasis. 7. Subacute sternal fractures and subacute fractures of the left fifth through tenth ribs. 8. Prior compression fractures at T10, L2, L3, and L4. 9.  Aortic Atherosclerosis (ICD10-I70.0). Electronically Signed   By: Van Clines M.D.   On: 06/30/2021 14:57   DG Abd 1 View  Result Date:  07/01/2021 CLINICAL DATA:  Abdominal distension and pain. EXAM: ABDOMEN - 1 VIEW COMPARISON:  Comparison with June 27, 2021. FINDINGS: Gastric tube remains in the stomach. Increasing distension of bowel loops. Evacuation of contrast was present in the rectum compared to previous imaging. EKG leads project throughout the abdomen. Osteopenia. No acute skeletal process on limited assessment. IMPRESSION: Slight increase in distension of bowel loops. Evacuation of contrast in the rectum compared to previous imaging. Findings likely related to global ileus. Attention on follow-up. Electronically Signed   By: Zetta Bills M.D.   On: 07/01/2021 14:51   CT ABDOMEN PELVIS W CONTRAST  Result Date: 06/24/2021 CLINICAL DATA:  Abdominal distension EXAM: CT ABDOMEN AND PELVIS WITH CONTRAST TECHNIQUE: Multidetector CT imaging of the abdomen and pelvis was performed using the standard protocol following bolus administration of intravenous contrast. CONTRAST:  72m OMNIPAQUE IOHEXOL 350 MG/ML SOLN COMPARISON:  CT 01/28/2019 FINDINGS: Lower chest: Bibasilar atelectasis. Heart size is mildly enlarged. Coronary artery calcifications are seen. Hepatobiliary: No focal liver abnormality is seen. No gallstones, gallbladder wall thickening, or biliary dilatation. Pancreas: Mildly atrophic, but appears grossly unremarkable. Spleen: Normal in size without focal abnormality. Adrenals/Urinary Tract: Unremarkable adrenal glands. Kidneys enhance symmetrically without focal lesion, stone, or hydronephrosis. Ureters are nondilated. Urinary bladder appears unremarkable. Stomach/Bowel: Markedly dilated stomach with fluid and air. No evidence of mechanical outlet obstruction. There is a moderate-sized rectal stool ball. Short segment area of narrowing at the rectosigmoid junction with a moderately dilated distal sigmoid colon. Moderate volume of stool throughout the remaining colon. What appears to be a normal appendix is seen in the right  lower quadrant (series 4, image 51). No dilated loops of small bowel are seen within the abdomen. Vascular/Lymphatic: Atherosclerotic calcifications are present throughout the aortoiliac axis. No abdominopelvic lymphadenopathy. Reproductive: Uterus and bilateral adnexa are unremarkable. Other: No free fluid. No  abdominopelvic fluid collection. No pneumoperitoneum. No abdominal wall hernia. Musculoskeletal: Diffuse osseous demineralization. Vertebra plana deformity of the T10 vertebral body, progressed from 2020. Mild chronic L2 compression deformity. IMPRESSION: 1. Markedly dilated stomach with fluid and air. No evidence of mechanical outlet obstruction. 2. Short segment area of narrowing at the rectosigmoid junction with a moderately dilated distal sigmoid colon. Findings may represent a focal area of peristalsis, although a colonic mass is not entirely excluded. A follow-up colonoscopy could be considered. 3. Moderate-sized rectal stool ball. 4. Vertebra plana deformity of the T10 vertebral body, progressed from 2020. Mild chronic L2 compression deformity. 5. Aortic atherosclerosis (ICD10-I70.0). Electronically Signed   By: Davina Poke D.O.   On: 06/24/2021 20:07   DG CHEST PORT 1 VIEW  Result Date: 07/01/2021 CLINICAL DATA:  Shortness of breath abdominal pain, negative COVID assessment on 06/25/2021. EXAM: PORTABLE CHEST 1 VIEW COMPARISON:  June 26, 2021 and CT of the abdomen and pelvis from June 30, 2021. FINDINGS: Gastric tube in place, tip likely in mid stomach, side port below the level of the GE junction in the proximal to mid stomach. EKG leads project over the chest. Cardiomediastinal contours are stable. There is further consolidative change at the LEFT lung base in the retrocardiac region obscuring LEFT hemidiaphragm. Changes of percutaneous coronary intervention along the LEFT heart border as before. No lobar consolidative changes.  No effusion on the RIGHT. Diffuse bowel distension in the  upper abdomen is similar to prior imaging. No acute skeletal process on limited assessment. IMPRESSION: 1. Increasing consolidative change at the LEFT lung base in the retrocardiac region. May reflect developing pneumonia or volume loss with pleural effusion. 2. Gastric tube in place, tip likely in mid stomach. Electronically Signed   By: Zetta Bills M.D.   On: 07/01/2021 14:49   DG CHEST PORT 1 VIEW  Result Date: 06/26/2021 CLINICAL DATA:  NG tube placement EXAM: PORTABLE CHEST 1 VIEW COMPARISON:  None. FINDINGS: NG tube with tip in the gastric body and side port below the GE junction. Low lung volumes.  Cardiac stent noted. IMPRESSION: NG tube with tip in stomach. Electronically Signed   By: Suzy Bouchard M.D.   On: 06/26/2021 09:17   DG CHEST PORT 1 VIEW  Result Date: 06/25/2021 CLINICAL DATA:  Cough, NG tube placement EXAM: PORTABLE CHEST 1 VIEW COMPARISON:  01/29/2019 FINDINGS: Lungs are essentially clear.  No pleural effusion or pneumothorax. The heart is normal in size.  Coronary stent. Enteric tube courses into the mid stomach. IMPRESSION: Enteric tube courses into the mid stomach. Electronically Signed   By: Julian Hy M.D.   On: 06/25/2021 02:44   DG ABD ACUTE 2+V W 1V CHEST  Result Date: 06/27/2021 CLINICAL DATA:  Abdominal pain and gastric distension. Patient with severe pain throughout. Hx of HTN and MI. Negative covid-19 test x 3 days ago. EXAM: DG ABDOMEN ACUTE WITH 1 VIEW CHEST COMPARISON:  the previous day's study FINDINGS: Low lung volumes with patchy subsegmental atelectasis at the lung bases. Heart size upper limits normal for technique. Aortic Atherosclerosis (ICD10-170.0). Coronary stent. Blunting of the left lateral costophrenic angle suggesting small effusion. No pneumothorax. No free air. Normal bowel gas pattern. Nasogastric tube extends into the decompressed stomach. Urinary bladder is distended. Regional bones unremarkable. IMPRESSION: 1. Distended urinary  bladder. 2. Low volumes with patchy subsegmental atelectasis at the lung bases, possible small left effusion. 3. For nasogastric tube to the decompressed stomach. Electronically Signed   By: Lucrezia Europe  M.D.   On: 06/27/2021 07:29   DG ABD ACUTE 2+V W 1V CHEST  Result Date: 06/26/2021 CLINICAL DATA:  87 year old female with abdominal pain. EXAM: DG ABDOMEN ACUTE WITH 1 VIEW CHEST COMPARISON:  CT Abdomen and Pelvis 06/24/2021 and earlier. FINDINGS: Portable upright and supine views at 0442 hours. Enteric tube has been placed into the stomach, with evidence of resolved gastric distension since the recent CT. Mildly distended urinary bladder with excreted IV contrast. Continued low lung volumes. No pneumothorax or pneumoperitoneum identified. Increased hypo ventilation at the left lung base. Stable cardiac size and mediastinal contours. Gas-filled bowel loops elsewhere in the abdomen and pelvis appear stable from the recent CT. Osteopenia. No acute osseous abnormality identified. IMPRESSION: 1. Enteric tube placed into the stomach with evidence of gastric decompression since the recent CT. 2. Otherwise stable bowel gas pattern.  No pneumoperitoneum. 3. Interval decreased ventilation at the left lung base, perhaps atelectasis. Electronically Signed   By: Genevie Ann M.D.   On: 06/26/2021 05:25   DG Abdomen Acute W/Chest  Result Date: 06/24/2021 CLINICAL DATA:  Abdominal pain and distension. EXAM: DG ABDOMEN ACUTE WITH 1 VIEW CHEST COMPARISON:  CT 01/28/2019 FINDINGS: Anti lordotic positioning. Low lung volumes. Borderline cardiomegaly with coronary stent. No acute airspace disease or pleural effusion. No pneumothorax. No free intra-abdominal air. Marked gaseous gastric distension, similar gastric distension seen on prior exam when the stomach is fluid-filled. Large volume of colonic stool. There is also likely gaseous distension of transverse colon, although air-filled structure coursing across the mid abdomen may be  distended stomach. No obvious small bowel dilatation. Prominent vascular calcifications are seen. The bones are diffusely under mineralized. No obvious acute osseous abnormality. Surgical clips in the right breast. IMPRESSION: 1. Marked gaseous gastric distension. The stomach was previously prominently distended with fluid. Air-filled structure coursing transversely across the mid abdomen may be gaseous distension of transverse colon or gastric distension. 2. Large volume of colonic stool. 3. Low lung volumes without acute abnormality in the chest. Electronically Signed   By: Keith Rake M.D.   On: 06/24/2021 17:59     CBC Recent Labs  Lab 06/27/21 2208 06/28/21 0329 06/28/21 1151 06/29/21 0408  WBC  --  6.0  --  4.5  HGB 12.4 11.3* 12.1 11.5*  HCT 34.8* 31.7* 35.2* 33.6*  PLT  --  154  --  165  MCV  --  96.4  --  95.5  MCH  --  34.3*  --  32.7  MCHC  --  35.6  --  34.2  RDW  --  13.8  --  13.9    Chemistries  Recent Labs  Lab 06/30/21 0538 07/01/21 0458 07/02/21 0745 07/03/21 0553 07/04/21 0524  NA 139 138 138 137 137  K 3.9 4.0 4.3 4.1 3.6  CL 112* 111 112* 112* 110  CO2 '23 22 23 '$ 21* 20*  GLUCOSE 124* 133* 155* 129* 133*  BUN 8 9 6* 6* 8  CREATININE 0.70 0.55 0.67 0.61 0.56  CALCIUM 7.8* 8.1* 8.0* 8.0* 8.3*   ------------------------------------------------------------------------------------------------------------------ No results for input(s): CHOL, HDL, LDLCALC, TRIG, CHOLHDL, LDLDIRECT in the last 72 hours.  Lab Results  Component Value Date   HGBA1C 5.0 11/25/2013   ------------------------------------------------------------------------------------------------------------------ No results for input(s): TSH, T4TOTAL, T3FREE, THYROIDAB in the last 72 hours.  Invalid input(s): FREET3 ------------------------------------------------------------------------------------------------------------------ No results for input(s): VITAMINB12, FOLATE, FERRITIN, TIBC,  IRON, RETICCTPCT in the last 72 hours.  Coagulation profile No results for input(s): INR, PROTIME  in the last 168 hours.   No results for input(s): DDIMER in the last 72 hours.  Cardiac Enzymes No results for input(s): CKMB, TROPONINI, MYOGLOBIN in the last 168 hours.  Invalid input(s): CK ------------------------------------------------------------------------------------------------------------------ No results found for: BNP   Deatra James M.D on 07/04/2021 at 10:50 AM  Go to [email or web address] - for contact info  [facility] - Office  [phone]

## 2021-07-04 NOTE — Care Management Important Message (Signed)
Important Message  Patient Details  Name: Debra Doyle MRN: [id number] Date of Birth: December 07, 1928   Medicare Important Message Given:  Yes     Tommy Medal 07/04/2021, 1:28 PM

## 2021-07-05 ENCOUNTER — Telehealth: Payer: Self-pay | Admitting: Gastroenterology

## 2021-07-05 ENCOUNTER — Telehealth: Payer: Self-pay | Admitting: Cardiology

## 2021-07-05 LAB — BASIC METABOLIC PANEL
Anion gap: 9 (ref 5–15)
BUN: 12 mg/dL (ref 8–23)
CO2: 21 mmol/L — ABNORMAL LOW (ref 22–32)
Calcium: 8.2 mg/dL — ABNORMAL LOW (ref 8.9–10.3)
Chloride: 108 mmol/L (ref 98–111)
Creatinine, Ser: 0.72 mg/dL (ref 0.44–1.00)
GFR, Estimated: >60 mL/min (ref >60)
Glucose, Bld: 95 mg/dL (ref 70–99)
Potassium: 3.7 mmol/L (ref 3.5–5.1)
Sodium: 138 mmol/L (ref 135–145)

## 2021-07-05 MED ORDER — PANTOPRAZOLE SODIUM 40 MG PO TBEC: 40.0000 mg | DELAYED_RELEASE_TABLET | Freq: Two times a day (BID) | ORAL | 0 refills | Status: AC

## 2021-07-05 MED ORDER — METOCLOPRAMIDE HCL 5 MG PO TABS: 5.0000 mg | ORAL_TABLET | Freq: Three times a day (TID) | ORAL | 1 refills | Status: AC

## 2021-07-05 MED ORDER — APIXABAN 5 MG PO TABS: 10.0000 mg | ORAL_TABLET | Freq: Two times a day (BID) | ORAL | 0 refills | Status: AC

## 2021-07-05 MED ORDER — LABETALOL HCL 100 MG PO TABS: 100.0000 mg | ORAL_TABLET | Freq: Two times a day (BID) | ORAL | 1 refills | Status: AC

## 2021-07-05 MED ORDER — POLYETHYLENE GLYCOL 3350 17 G PO PACK: 17.0000 g | PACK | Freq: Three times a day (TID) | ORAL | 0 refills | Status: AC

## 2021-07-05 MED ORDER — SENNOSIDES-DOCUSATE SODIUM 8.6-50 MG PO TABS: 2.0000 | ORAL_TABLET | Freq: Two times a day (BID) | ORAL | 0 refills | Status: AC

## 2021-07-05 MED ORDER — APIXABAN 5 MG PO TABS: 5.0000 mg | ORAL_TABLET | Freq: Two times a day (BID) | ORAL | 2 refills | Status: AC

## 2021-07-05 MED ORDER — FUROSEMIDE 40 MG PO TABS: 20.0000 mg | ORAL_TABLET | Freq: Every day | ORAL | 0 refills | Status: AC

## 2021-07-05 NOTE — TOC Transition Note (Addendum)
Transition of Care [facility]) - CM/SW Discharge Note   Patient Details  Name: QUASIA KETCHERSIDE MRN: [id number] Date of Birth: [birth year]  Transition of Care [facility]) CM/SW Contact:  Ihor Gully, LCSW Phone Number: 07/05/2021, 11:07 AM   Clinical Narrative:    Per Palliative Care NP referral made to [place] for home with hospice.  Update: Casandra at [facility] hospice confirms receipt of referral.    Final next level of care: Home w Hospice Care Barriers to Discharge: Continued Medical Work up   Patient Goals and CMS Choice        Discharge Placement                       Discharge Plan and Services                          HH Arranged: PT [facility] Agency: [place] ([facility]) Date [place]: 07/04/21 Time [place]: 1253 Representative spoke with at [place]: [place] Determinants of Health ([place]) Interventions     Readmission Risk Interventions No flowsheet data found.

## 2021-07-05 NOTE — Telephone Encounter (Signed)
New message    Patient was discharged today with labetalol (NORMODYNE) 100 MG tablet she is already taking carvedilol (COREG) 6.25 MG tablet. Which medication should she discontinue , she should not be taking both medications

## 2021-07-05 NOTE — Progress Notes (Signed)
Brief progress note as patient is being discharged today:  87 year old female with multiple comorbidities who was admitted to the hospital after presenting with abdominal pain, distention, nausea, and vomiting, found to have gastric distention and questionable narrowing at the rectosigmoid junction with rectal stool ball.  NG tube was placed initially.  She underwent EGD and flex sig that showed nonbleeding gastric ulcer with normal histology, negative for H. pylori, and normal duodenum without presence of obstructive lesions.  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    Overall clinical picture most consistent with generalized dysmotility of the GI system, likely worsened by poor mobility. She started on low-dose Reglan trial (tolerating well) and has been on MiraLAX 3 times daily and Senokot twice daily with improvement in bowel movements and decreasing abdominal distension. NG tube removed on 8/20 and started on clear liquids at that time as well which she has been tolerating well and has no complaints for me today. Her abdomen is still distended, but bowel sounds are normoactive and her abdomen is soft without tenderness to palpation.  Advance her diet to full liquids today and recommended continuing full liquids, advancing slowly at home as tolerated as she is being discharged from the hospital today.  She will need to return to the hospital if any worsening symptoms.  Regarding rectal mass and colon polyps, could consider outpatient colonoscopy, but per discussion between patient, son, and palliative yesterday, the consensus at that time was not to pursue any additional work-up/intervention.  I think this is a reasonable considering her age and  comorbidities.   PUD: Nonbleeding gastric ulcer with normal histology, negative for H. pylori on EGD this admission.  Patient denies  NSAID use aside from daily 81 mg aspirin.  Started on PPI twice daily this admission which she will need to continue.  Plan:  Advance to full liquid diet today.  Advised to continue this and advance slowly at home as tolerated as she is getting discharged today.  She will need to return to the emergency room if recurrent nausea/vomiting, worsening abdominal distention. Continue Reglan 5 mg 3 times daily. Continue MiraLAX 17 g 3 times daily. Reinforced importance of mobilization as tolerated. Continue PPI twice daily. Avoid NSAIDs.  We will arrange outpatient follow-up.    Aliene Altes, PA-C [facility] 07/05/2021

## 2021-07-05 NOTE — TOC Transition Note (Signed)
Transition of Care [facility]) - CM/SW Discharge Note   Patient Details  Name: [name] MRN: [id number] Date of Birth: 01-17-1929  Transition [facility]) CM/SW Contact:  Ihor Gully, LCSW Phone Number: 07/05/2021, 3:43 PM   Clinical Narrative:    Per Palliative Care NP referral made to [place] for home with hospice.  Update: Casandra at [facility] hospice confirms receipt of referral.  Son, Shanon Brow says that he just found out today that patient is discharging home (patient lives next door to son). He states that he need a day notice. Son, Shanon Brow agreeable to pick patient up tomorrow 8/24.    Final next level of care: Home w Hospice Care Barriers to Discharge: Continued Medical Work up   Patient Goals and CMS Choice        Discharge Placement                       Discharge Plan and Services                          HH Arranged: PT [facility] Agency: [place] ([facility]) Date [place]: 07/04/21 Time [place]: 1253 Representative spoke with at [place]: [place] Determinants of Health ([place]) Interventions     Readmission Risk Interventions No flowsheet data found.

## 2021-07-05 NOTE — Telephone Encounter (Signed)
Discussed with [facility] staff the below d/c instructions given to patient today  STOP taking: aspirin EC 81 MG tablet  carvedilol 6.25 MG tablet (COREG)  losartan 50 MG tablet (COZAAR)  spironolactone 25 MG tablet (ALDACTONE)

## 2021-07-05 NOTE — Progress Notes (Signed)
Palliative: Debra Doyle is lying quietly in bed.  She greets me making and somewhat keeping eye contact.  She appears acutely/chronically ill and quite frail, kyphotic.  She is alert and oriented, able to make her basic needs known, although she does have times of confusion.  There is no family at bedside at this time.  We talked about her acute and chronic health concerns.  Debra Doyle tells me that she has an active home life, lots of support.  We talked about further support at home through hospice.  Debra Doyle is agreeable to home with hospice for "treat the treatable" hospice care.  Conversation with son, [name] and caregiver, Vaughan Basta, yesterday where they both agreed that hospice would be a big benefit.  Provider choice offered, patient elects hospice of [facility].    Conference with attending, bedside nursing staff, transition of care team related to patient condition, needs, goals of care, disposition. [place] form/DNR completed and placed on chart.  Plan: Continue to treat the treatable but no CPR or intubation.  Home with the benefits of hospice of [facility] for "treat the treatable" care.  4 minutes Quinn Axe, NP Palliative medicine team Team phone [phone] Greater than 50% of this time was spent counseling and coordinating care related to the above assessment and plan.

## 2021-07-05 NOTE — Discharge Summary (Addendum)
Physician Discharge Summary [facility]    Patient: Debra Doyle                   Admit date: 06/24/2021   DOB: [birth year]             Discharge date:07/05/2021/12:41 PM [id number]                          PCP: [name], MD  Disposition: HOME with Hospice  Recommendations for Outpatient Follow-up:   Follow up: Gastroenterologist in 2-3 weeks for further evaluation possible outpatient colonoscopy Follow-up with general PCP in 2 to 3 weeks Continue with home palliative care--referrals have been made  Discharge Condition: Stable   Code Status:   Code Status: DNR  Diet recommendation: Soft, liquid healthy diet   Discharge Diagnoses:    Principal Problem:   Gastric distention Active Problems:   AKI (acute kidney injury) ([place])   Hyponatremia   Mild protein-calorie malnutrition ([facility])   Hypotension   Abdominal distension   Rectal mass   Constipation   Shortness of breath   Ileus, unspecified ([place])   History of Present Illness/ Hospital Course Debra Doyle Summary:    Brief Summary:- 87 y.o. female, with history of coronary artery disease/Prior MI/ischemic cardiomyopathy and HTN--admitted on 06/25/2021 with abdominal pain nausea and vomiting and found to have significant gastric distention on CT abdomen and pelvis        Abdominal pain with Nausea and Vomiting -abdominal distention -Some improvement noted, with abdominal pain, nausea vomiting has improved   -Reporting 4 stools past 24 hours   -KUB consistent with ileus-but patient is passing gas and has had BMs, no signs of obstruction - on scheduled p.o. Reglan   - Gasteric Ulcer/5 cm anal mass - significant gastric distention on CT abdomen and Pelvis.   Revealing no retrosigmoid obstruction with rectal mass -NG tube replaced on 06/26/21 -  D/Ced 07/02/2021 --Tolerating p.o. -Continue MiraLAX, ambulation, following imaging, as needed Reglan   Per surgery: -No surgical intervention, she can proceed  with colonoscopy once rectal polyp was removed per general surgery - per GI:  colonoscopy as an outpatient     5 cm anal mass - EGD and colonoscopy on EGD and flex sig- - EGD: Nonbleeding Gastric ulcer, --Flex sig shows lower: anal mass 5 cm from anal verge --- status postbiopsy pending pathology   -Biopsy results --Path report -benign nonmalignant mass   Short segment area of Narrowing  S/p EGD and flex sig on 8/16/2022g at the Rectosigmoid junction with a moderately dilated distal sigmoid colon--- rectal stool ball noted on CT abdomen and pelvis as well  -At this point profound stricture is ruled out with CT scan with contrast-   -Malignancy of the mass has been ruled out -GI recommending colonoscopy as an outpatient in the future     -Continue laxatives --- Surgery following peripherally     Left arm edema  -Ultrasound positive for DVT, initiated Eliquis -Recommended elevation...    Dehydration/AKI/HypoMagnesiemia/HypoKalemia/HypoNatremia-- -Due to poor p.o. intake, nausea vomiting-improved with treatment electrolytes and IV fluids - Na 124 >>>137, 139 -Resolved but continue monitoring   4)Transiet Hypotension- -Resolved, sepsis ruled out AM Cortisol is 26.2, with no leukocytosis PCT is 0.23 Lactic acid 2.8 >>1.5 (lactic acidosis may be due to dehydration and poor tissue perfusion) -Discontinued vancomycin, cefepime and Flagyl   5) Hypertension -Discontinued Norvasc and Coreg, initiating labetalol 100 mg p.o. twice daily -Monitoring  and titrating medication accordingly -Continue labetalol   Mild respite distress -On 2 L of oxygen, satting 96% -Chest x-ray questionable possible infiltrate but afebrile normotensive with no leukocytosis Chest x-ray may be obscured due to abdominal distention, patient leaning forward unable to obtain a PA lateral-as patient not able to completely set up -Questionable infiltrate, no clear indication of infection or pneumonia -Was not  treated with any antibiotic at this time               Left arm DVT, initiated Eliquis, told the recommended treatment plan approximately 3 months, to be reevaluated by PCP to discuss length of treatment     SKIN assessment: I agree with skin assessment and plan as outlined below: Pressure Injury 06/28/21 Coccyx Mid Deep Tissue Pressure Injury - Purple or maroon localized area of discolored intact skin or blood-filled blister due to damage of underlying soft tissue from pressure and/or shear. (Active)  06/28/21 2030  Location: Coccyx  Location Orientation: Mid  Staging: Deep Tissue Pressure Injury - Purple or maroon localized area of discolored intact skin or blood-filled blister due to damage of underlying soft tissue from pressure and/or shear.  Wound Description (Comments):   Present on Admission: Yes       Ethics -DNR/DNI Prognosis poor due to age, multiple comorbidities, decreased mobility decreased p.o. intake continue GI issues Discussed with family, his son -POA Disposition: The patient is from: Home              Anticipated d/c is to: Home with home health care, palliative care consultation    Code Status :  -  Code Status: DNR    Family Communication:   (patient is alert, awake and coherent)  Son [name] at [phone] -discussed with son and daughter-in-law regarding the findings, steady decline refusing to participate in any physical poor p.o. intake, consistent abdominal pain, distention, multiple comorbidities ... they Agreed to palliative care -which services they have excepted to be continued at home. Consults  :  Gi/surg.      Discharge Instructions:   Discharge Instructions     Activity as tolerated - No restrictions   Complete by: As directed    Call MD for:  difficulty breathing, headache or visual disturbances   Complete by: As directed    Call MD for:  persistant dizziness or light-headedness   Complete by: As directed    Call MD for:   persistant nausea and vomiting   Complete by: As directed    Call MD for:  redness, tenderness, or signs of infection (pain, swelling, redness, odor or green/yellow discharge around incision site)   Complete by: As directed    Call MD for:  temperature >100.4   Complete by: As directed    Diet - low sodium heart healthy   Complete by: As directed    Discharge instructions   Complete by: As directed    Continue current recommended medication, ambulate as much as possible, will help with bowel movement, gas, continue current laxative stool softeners, hold with any loose stool or diarrhea.   Discharge wound care:   Complete by: As directed    Per nursing instructions, continue daily zinc oxide or equivalent cream to avoid further skin breakdown   Increase activity slowly   Complete by: As directed         Medication List     STOP taking these medications    aspirin EC 81 MG tablet   carvedilol 6.25 MG tablet  Commonly known as: COREG   losartan 50 MG tablet Commonly known as: COZAAR   spironolactone 25 MG tablet Commonly known as: ALDACTONE       TAKE these medications    acetaminophen 325 MG tablet Commonly known as: TYLENOL Take 2 tablets (650 mg total) by mouth every 6 (six) hours as needed for fever or headache (pain).   apixaban 5 MG Tabs tablet Commonly known as: ELIQUIS Take 2 tablets (10 mg total) by mouth 2 (two) times daily for 10 days.   apixaban 5 MG Tabs tablet Commonly known as: ELIQUIS Take 1 tablet (5 mg total) by mouth 2 (two) times daily. Start taking on: July 11, 2021   atorvastatin 40 MG tablet Commonly known as: LIPITOR TAKE 1 TABLET BY MOUTH EVERY DAY AT 6:00 PM What changed: See the new instructions.   BILBERRY PLUS PO Take 1 capsule by mouth daily.   cyanocobalamin 1000 MCG tablet Take 1,000 mcg by mouth daily.   furosemide 40 MG tablet Commonly known as: LASIX Take 0.5 tablets (20 mg total) by mouth daily. What changed: how  much to take   ketoconazole 2 % cream Commonly known as: NIZORAL Apply 1 application topically 2 (two) times daily.   labetalol 100 MG tablet Commonly known as: NORMODYNE Take 1 tablet (100 mg total) by mouth 2 (two) times daily.   LUTEIN PO Take 1 capsule by mouth daily.   metoCLOPramide 5 MG tablet Commonly known as: REGLAN Take 1 tablet (5 mg total) by mouth 3 (three) times daily before meals for 10 days.   nitroGLYCERIN 0.4 MG SL tablet Commonly known as: NITROSTAT Place 0.4 mg under the tongue as needed for chest pain. Place one tablet under the tongue every 5 minutes x 3 doses max as needed for chest pain.  Contact MD or 911 with unrelieved pain   pantoprazole 40 MG tablet Commonly known as: PROTONIX Take 1 tablet (40 mg total) by mouth 2 (two) times daily. What changed: when to take this   polyethylene glycol 17 g packet Commonly known as: MIRALAX / GLYCOLAX Take 17 g by mouth 3 (three) times daily.   senna 8.6 MG Tabs tablet Commonly known as: SENOKOT Take 1 tablet by mouth daily as needed for mild constipation.   senna-docusate 8.6-50 MG tablet Commonly known as: Senokot-S Take 2 tablets by mouth 2 (two) times daily.   vitamin C 500 MG tablet Commonly known as: ASCORBIC ACID Take 500 mg by mouth daily.   Zinc Oxide 10 % Oint Apply 1 application topically daily as needed (buttocks).        No Known Allergies   Procedures /Studies:   CT ABDOMEN PELVIS WO CONTRAST  Result Date: 06/30/2021 CLINICAL DATA:  Abdominal distension, rectal mass on sigmoidoscopy performed yesterday. EXAM: CT ABDOMEN AND PELVIS WITHOUT CONTRAST TECHNIQUE: Multidetector CT imaging of the abdomen and pelvis was performed following the standard protocol without IV contrast. COMPARISON:  Multiple exams, including 06/24/2021 FINDINGS: Lower chest: Small bilateral pleural effusions with atelectasis in both lower lobes and mild atelectasis in the right middle lobe and lingula. Clips are  noted along the upper glandular tissues of the right breast. Coronary atherosclerosis most notable in the left anterior descending coronary artery. Descending thoracic aortic atherosclerosis. Hepatobiliary: Poor visualization of the gallbladder. Otherwise unremarkable. Pancreas: Unremarkable Spleen: Unremarkable Adrenals/Urinary Tract: Foley catheter in the otherwise empty urinary bladder. No hydronephrosis or hydroureter. Normal renal contours. No renal calculi observed. Stomach/Bowel: A nasogastric tube terminates in the  stomach body. There less distention of the stomach than on the prior exam. Increased thickening of the gastric wall probably related to the lesser degree of distension compared to previous. A rectal tube is in place and contrast was administered via the rectal tube. 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 The rectum is fairly distended (possibly due to contrast filling), there is transition to smaller caliber at the rectosigmoid junction which partially fills with contrast medium for example on image 54 series 2. There is formed stool in the sigmoid colon along with various diverticular compatible with diverticulosis. The amount of formed stool in the colon is pronounced compatible with constipation and there is likely some redundancy of the sigmoid colon which is otherwise difficult to follow due to the paucity of intra-adipose tissue in this patient which makes separation of bowel loops difficult. The transverse colon does course down into the left lower quadrant and then back up towards the spleen, compatible with redundant transverse colon. No overtly dilated small bowel loops are identified. There is stranding in the perirectal space for example on image 65 of series 2 which may indicate inflammation or third spacing of  fluid, some of this was also present on 06/24/2021. There is no extraluminal gas or substantial perirectal hematoma identified. Soft tissue density anterior to the rectum is compatible with uterine tissues. Vascular/Lymphatic: Aortoiliac atherosclerotic vascular disease. Reproductive: Uterus visualized although the contour is fairly indistinct. Other: There is potentially low-level mesenteric edema as well. Edema tracks into the left upper thigh region anteriorly and along the pubis. Musculoskeletal: There is a transverse fracture of the lower sternum on image 67 series 6. Age indeterminate nondisplaced rib fractures of the left fifth through tenth ribs. Stable prominent compression fracture at T10 and stable mild endplate compression fractures at L2 and along the superior endplate of L3. Stable mild superior endplate compression fracture at L4. IMPRESSION: 1. Interval decompression of the stomach with a nasogastric tube. 2. Rectal contrast was administered and L lined some anterior rectal irregularity probably corresponding to the biopsy tumor. 3. Prominent stool throughout the colon favors constipation. Redundant sigmoid colon and redundant transverse colon. 4. There is some increased presacral and perirectal stranding although this may be part of the or widespread third spacing of fluid process with small bilateral pleural effusions, bilateral subcutaneous edema, and mild mesenteric edema. 5. No findings of extraluminal gas or obvious abscess. Paucity of intra-adipose tissue makes separating adjacent bowel loops difficult. 6. Bibasilar atelectasis. 7. Subacute sternal fractures and subacute fractures of the left fifth through tenth ribs. 8. Prior compression fractures at T10, L2, L3, and L4. 9.  Aortic Atherosclerosis (ICD10-I70.0). Electronically Signed   By: Van Clines M.D.   On: 06/30/2021 14:57   DG Chest 1 View  Result Date: 07/04/2021 CLINICAL DATA:  Shortness of breath, abdominal pain and  distension EXAM: CHEST  1 VIEW COMPARISON:  Chest radiograph 07/01/2021 FINDINGS: The enteric catheter has been removed. The cardiomediastinal silhouette is grossly stable. The right lung apex is obscured by overlying soft tissues. There is a layering left pleural effusion which appears slightly decreased in size. Aeration of the left base has slightly improved. Left upper lung is well aerated. Confluent opacity in the right base has worsened in the interim. There may be a small right pleural effusion. There is no left pneumothorax. No  right pneumothorax is identified, though as above the right apex is obscured. The bones are grossly stable. IMPRESSION: 1. Slight interval decrease in size of the left pleural effusion with improved aeration of the left base. 2. Hazy opacity projecting over the right base has worsened in the interim and may reflect atelectasis or infection. There is a possible small right pleural effusion. Electronically Signed   By: Valetta Mole M.D.   On: 07/04/2021 14:35   DG Abd 1 View  Result Date: 07/04/2021 CLINICAL DATA:  Shortness of breath, abdominal pain and distension EXAM: ABDOMEN - 1 VIEW COMPARISON:  Abdominal radiographs 07/01/2021, CT abdomen/pelvis 06/30/2021 FINDINGS: There are multiple gas distended loops of bowel throughout the abdomen measuring up to 5.6 cm. The appearance is grossly similar to the prior abdominal radiographs. There is no definite free intraperitoneal air, though evaluation is degraded by lack of upright radiographs. There is multilevel degenerative change of the spine. IMPRESSION: Multiple gas distended loops of bowel throughout the abdomen are grossly similar to the prior radiographs. This may reflect persistent ileus, though mechanical obstruction cannot be excluded. Electronically Signed   By: Valetta Mole M.D.   On: 07/04/2021 14:39   DG Abd 1 View  Result Date: 07/01/2021 CLINICAL DATA:  Abdominal distension and pain. EXAM: ABDOMEN - 1 VIEW  COMPARISON:  Comparison with June 27, 2021. FINDINGS: Gastric tube remains in the stomach. Increasing distension of bowel loops. Evacuation of contrast was present in the rectum compared to previous imaging. EKG leads project throughout the abdomen. Osteopenia. No acute skeletal process on limited assessment. IMPRESSION: Slight increase in distension of bowel loops. Evacuation of contrast in the rectum compared to previous imaging. Findings likely related to global ileus. Attention on follow-up. Electronically Signed   By: Zetta Bills M.D.   On: 07/01/2021 14:51   CT ABDOMEN PELVIS W CONTRAST  Result Date: 06/24/2021 CLINICAL DATA:  Abdominal distension EXAM: CT ABDOMEN AND PELVIS WITH CONTRAST TECHNIQUE: Multidetector CT imaging of the abdomen and pelvis was performed using the standard protocol following bolus administration of intravenous contrast. CONTRAST:  47m OMNIPAQUE IOHEXOL 350 MG/ML SOLN COMPARISON:  CT 01/28/2019 FINDINGS: Lower chest: Bibasilar atelectasis. Heart size is mildly enlarged. Coronary artery calcifications are seen. Hepatobiliary: No focal liver abnormality is seen. No gallstones, gallbladder wall thickening, or biliary dilatation. Pancreas: Mildly atrophic, but appears grossly unremarkable. Spleen: Normal in size without focal abnormality. Adrenals/Urinary Tract: Unremarkable adrenal glands. Kidneys enhance symmetrically without focal lesion, stone, or hydronephrosis. Ureters are nondilated. Urinary bladder appears unremarkable. Stomach/Bowel: Markedly dilated stomach with fluid and air. No evidence of mechanical outlet obstruction. There is a moderate-sized rectal stool ball. Short segment area of narrowing at the rectosigmoid junction with a moderately dilated distal sigmoid colon. Moderate volume of stool throughout the remaining colon. What appears to be a normal appendix is seen in the right lower quadrant (series 4, image 51). No dilated loops of small bowel are seen within  the abdomen. Vascular/Lymphatic: Atherosclerotic calcifications are present throughout the aortoiliac axis. No abdominopelvic lymphadenopathy. Reproductive: Uterus and bilateral adnexa are unremarkable. Other: No free fluid. No abdominopelvic fluid collection. No pneumoperitoneum. No abdominal wall hernia. Musculoskeletal: Diffuse osseous demineralization. Vertebra plana deformity of the T10 vertebral body, progressed from 2020. Mild chronic L2 compression deformity. IMPRESSION: 1. Markedly dilated stomach with fluid and air. No evidence of mechanical outlet obstruction. 2. Short segment area of narrowing at the rectosigmoid junction with a moderately dilated distal sigmoid colon. Findings may represent a focal area of  peristalsis, although a colonic mass is not entirely excluded. A follow-up colonoscopy could be considered. 3. Moderate-sized rectal stool ball. 4. Vertebra plana deformity of the T10 vertebral body, progressed from 2020. Mild chronic L2 compression deformity. 5. Aortic atherosclerosis (ICD10-I70.0). Electronically Signed   By: Davina Poke D.O.   On: 06/24/2021 20:07   US Venous Img Upper Uni Left (DVT)  Result Date: 07/04/2021 CLINICAL DATA:  Left upper extremity edema. EXAM: LEFT UPPER EXTREMITY VENOUS DOPPLER ULTRASOUND TECHNIQUE: Gray-scale sonography with graded compression, as well as color Doppler and duplex ultrasound were performed to evaluate the upper extremity deep venous system from the level of the subclavian vein and including the jugular, axillary, basilic, radial, ulnar and upper cephalic vein. Spectral Doppler was utilized to evaluate flow at rest and with distal augmentation maneuvers. COMPARISON:  None. FINDINGS: Contralateral Subclavian Vein: Respiratory phasicity is normal and symmetric with the symptomatic side. No evidence of thrombus. Normal compressibility. Internal Jugular Vein: There is some flow in the visualized left internal jugular vein. Echogenicity at the  base of the left internal jugular vein may relate to slow flow or partial nonocclusive thrombus. Subclavian Vein: Visualized segment of the left subclavian vein demonstrates occlusive thrombus. Axillary Vein: Nonocclusive thrombus in a segment of the visualized left axillary vein. Cephalic Vein: The cephalic vein demonstrates occlusive thrombus. Basilic Vein: Occlusive thrombus visualized in the left basilic vein. Brachial Veins: No evidence of thrombus. Normal compressibility, respiratory phasicity and response to augmentation. Radial Veins: No evidence of thrombus. Normal compressibility, respiratory phasicity and response to augmentation. Ulnar Veins: No evidence of thrombus. Normal compressibility, respiratory phasicity and response to augmentation. Venous Reflux:  None visualized. Other Findings:  None visualized. IMPRESSION: Positive for DVT of the left upper extremity localizing to the visualized subclavian vein and a segment of the axillary vein. There is superficial thrombophlebitis of the cephalic vein and basilic vein. The base of the left internal jugular vein demonstrates slow flow and there may be some component of partial nonocclusive thrombus. These results will be called to the ordering clinician or representative by the Radiologist Assistant, and communication documented in the PACS or [employer]. Electronically Signed   By: Aletta Edouard M.D.   On: 07/04/2021 13:20   DG CHEST PORT 1 VIEW  Result Date: 07/01/2021 CLINICAL DATA:  Shortness of breath abdominal pain, negative COVID assessment on 06/25/2021. EXAM: PORTABLE CHEST 1 VIEW COMPARISON:  June 26, 2021 and CT of the abdomen and pelvis from June 30, 2021. FINDINGS: Gastric tube in place, tip likely in mid stomach, side port below the level of the GE junction in the proximal to mid stomach. EKG leads project over the chest. Cardiomediastinal contours are stable. There is further consolidative change at the LEFT lung base in the  retrocardiac region obscuring LEFT hemidiaphragm. Changes of percutaneous coronary intervention along the LEFT heart border as before. No lobar consolidative changes.  No effusion on the RIGHT. Diffuse bowel distension in the upper abdomen is similar to prior imaging. No acute skeletal process on limited assessment. IMPRESSION: 1. Increasing consolidative change at the LEFT lung base in the retrocardiac region. May reflect developing pneumonia or volume loss with pleural effusion. 2. Gastric tube in place, tip likely in mid stomach. Electronically Signed   By: Zetta Bills M.D.   On: 07/01/2021 14:49   DG CHEST PORT 1 VIEW  Result Date: 06/26/2021 CLINICAL DATA:  NG tube placement EXAM: PORTABLE CHEST 1 VIEW COMPARISON:  None. FINDINGS: NG tube with tip  in the gastric body and side port below the GE junction. Low lung volumes.  Cardiac stent noted. IMPRESSION: NG tube with tip in stomach. Electronically Signed   By: Suzy Bouchard M.D.   On: 06/26/2021 09:17   DG CHEST PORT 1 VIEW  Result Date: 06/25/2021 CLINICAL DATA:  Cough, NG tube placement EXAM: PORTABLE CHEST 1 VIEW COMPARISON:  01/29/2019 FINDINGS: Lungs are essentially clear.  No pleural effusion or pneumothorax. The heart is normal in size.  Coronary stent. Enteric tube courses into the mid stomach. IMPRESSION: Enteric tube courses into the mid stomach. Electronically Signed   By: Julian Hy M.D.   On: 06/25/2021 02:44   DG ABD ACUTE 2+V W 1V CHEST  Result Date: 06/27/2021 CLINICAL DATA:  Abdominal pain and gastric distension. Patient with severe pain throughout. Hx of HTN and MI. Negative covid-19 test x 3 days ago. EXAM: DG ABDOMEN ACUTE WITH 1 VIEW CHEST COMPARISON:  the previous day's study FINDINGS: Low lung volumes with patchy subsegmental atelectasis at the lung bases. Heart size upper limits normal for technique. Aortic Atherosclerosis (ICD10-170.0). Coronary stent. Blunting of the left lateral costophrenic angle suggesting  small effusion. No pneumothorax. No free air. Normal bowel gas pattern. Nasogastric tube extends into the decompressed stomach. Urinary bladder is distended. Regional bones unremarkable. IMPRESSION: 1. Distended urinary bladder. 2. Low volumes with patchy subsegmental atelectasis at the lung bases, possible small left effusion. 3. For nasogastric tube to the decompressed stomach. Electronically Signed   By: Lucrezia Europe M.D.   On: 06/27/2021 07:29   DG ABD ACUTE 2+V W 1V CHEST  Result Date: 06/26/2021 CLINICAL DATA:  87 year old female with abdominal pain. EXAM: DG ABDOMEN ACUTE WITH 1 VIEW CHEST COMPARISON:  CT Abdomen and Pelvis 06/24/2021 and earlier. FINDINGS: Portable upright and supine views at 0442 hours. Enteric tube has been placed into the stomach, with evidence of resolved gastric distension since the recent CT. Mildly distended urinary bladder with excreted IV contrast. Continued low lung volumes. No pneumothorax or pneumoperitoneum identified. Increased hypo ventilation at the left lung base. Stable cardiac size and mediastinal contours. Gas-filled bowel loops elsewhere in the abdomen and pelvis appear stable from the recent CT. Osteopenia. No acute osseous abnormality identified. IMPRESSION: 1. Enteric tube placed into the stomach with evidence of gastric decompression since the recent CT. 2. Otherwise stable bowel gas pattern.  No pneumoperitoneum. 3. Interval decreased ventilation at the left lung base, perhaps atelectasis. Electronically Signed   By: Genevie Ann M.D.   On: 06/26/2021 05:25   DG Abdomen Acute W/Chest  Result Date: 06/24/2021 CLINICAL DATA:  Abdominal pain and distension. EXAM: DG ABDOMEN ACUTE WITH 1 VIEW CHEST COMPARISON:  CT 01/28/2019 FINDINGS: Anti lordotic positioning. Low lung volumes. Borderline cardiomegaly with coronary stent. No acute airspace disease or pleural effusion. No pneumothorax. No free intra-abdominal air. Marked gaseous gastric distension, similar gastric  distension seen on prior exam when the stomach is fluid-filled. Large volume of colonic stool. There is also likely gaseous distension of transverse colon, although air-filled structure coursing across the mid abdomen may be distended stomach. No obvious small bowel dilatation. Prominent vascular calcifications are seen. The bones are diffusely under mineralized. No obvious acute osseous abnormality. Surgical clips in the right breast. IMPRESSION: 1. Marked gaseous gastric distension. The stomach was previously prominently distended with fluid. Air-filled structure coursing transversely across the mid abdomen may be gaseous distension of transverse colon or gastric distension. 2. Large volume of colonic stool. 3. Low lung volumes  without acute abnormality in the chest. Electronically Signed   By: Keith Rake M.D.   On: 06/24/2021 17:59    Subjective:   Patient was seen and examined 07/05/2021, 12:41 PM Patient stable today. No acute distress.  No issues overnight Stable for discharge.  Discharge Exam:    Vitals:   07/04/21 1411 07/04/21 2148 07/05/21 0510 07/05/21 0802  BP: (!) 148/98 (!) 147/106 (!) 145/90 (!) 174/114  Pulse: 81 92 85 92  Resp: '17 18 19   '$ Temp: 98.4 F (36.9 C) 97.9 F (36.6 C) 99 F (37.2 C)   TempSrc:  Oral Oral   SpO2: 97% 98% 98%   Weight:      Height:        General: Pt lying comfortably in bed & appears in no obvious distress. Cardiovascular: S1 & S2 heard, RRR, S1/S2 +. No murmurs, rubs, gallops or clicks. No JVD or pedal edema. Respiratory: Clear to auscultation without wheezing, rhonchi or crackles. No increased work of breathing. Abdominal:  Non-distended, non-tender & soft. No organomegaly or masses appreciated. Normal bowel sounds heard. CNS: Alert and oriented. No focal deficits. Extremities: no edema, no cyanosis Pressure Injury 06/28/21 Coccyx Mid Deep Tissue Pressure Injury - Purple or maroon localized area of discolored intact skin or  blood-filled blister due to damage of underlying soft tissue from pressure and/or shear. (Active)  06/28/21 2030  Location: Coccyx  Location Orientation: Mid  Staging: Deep Tissue Pressure Injury - Purple or maroon localized area of discolored intact skin or blood-filled blister due to damage of underlying soft tissue from pressure and/or shear.  Wound Description (Comments):   Present on Admission: Yes      The results of significant diagnostics from this hospitalization (including imaging, microbiology, ancillary and laboratory) are listed below for reference.      Microbiology:   No results found for this or any previous visit (from the past 240 hour(s)).   Labs:   CBC: Recent Labs  Lab 06/29/21 0408 07/04/21 0524  WBC 4.5 9.6  NEUTROABS  --  8.5*  HGB 11.5* 11.1*  HCT 33.6* 33.3*  MCV 95.5 101.8*  PLT 165 0000000   Basic Metabolic Panel: Recent Labs  Lab 07/01/21 0458 07/02/21 0745 07/03/21 0553 07/04/21 0524 07/05/21 0459  NA 138 138 137 137 138  K 4.0 4.3 4.1 3.6 3.7  CL 111 112* 112* 110 108  CO2 22 23 21* 20* 21*  GLUCOSE 133* 155* 129* 133* 95  BUN 9 6* 6* 8 12  CREATININE 0.55 0.67 0.61 0.56 0.72  CALCIUM 8.1* 8.0* 8.0* 8.3* 8.2*   Liver Function Tests: No results for input(s): AST, ALT, ALKPHOS, BILITOT, PROT, ALBUMIN in the last 168 hours. BNP (last 3 results) No results for input(s): BNP in the last 8760 hours. Cardiac Enzymes: No results for input(s): CKTOTAL, CKMB, CKMBINDEX, TROPONINI in the last 168 hours. CBG: Recent Labs  Lab 06/30/21 1110 06/30/21 1740 07/01/21 0755 07/01/21 1552 07/02/21 0002  GLUCAP 110* 168* 130* 134* 139*   Hgb A1c No results for input(s): HGBA1C in the last 72 hours. Lipid Profile No results for input(s): CHOL, HDL, LDLCALC, TRIG, CHOLHDL, LDLDIRECT in the last 72 hours. Thyroid function studies No results for input(s): TSH, T4TOTAL, T3FREE, THYROIDAB in the last 72 hours.  Invalid input(s): FREET3 Anemia  work up No results for input(s): VITAMINB12, FOLATE, FERRITIN, TIBC, IRON, RETICCTPCT in the last 72 hours. Urinalysis    Component Value Date/Time   COLORURINE YELLOW 01/28/2019 1522  APPEARANCEUR CLEAR 01/28/2019 1522   LABSPEC 1.038 (H) 01/28/2019 1522   PHURINE 5.0 01/28/2019 1522   GLUCOSEU NEGATIVE 01/28/2019 1522   HGBUR NEGATIVE 01/28/2019 1522   BILIRUBINUR NEGATIVE 01/28/2019 1522   KETONESUR 5 (A) 01/28/2019 1522   PROTEINUR NEGATIVE 01/28/2019 1522   UROBILINOGEN 0.2 12/10/2010 1555   NITRITE NEGATIVE 01/28/2019 1522   LEUKOCYTESUR NEGATIVE 01/28/2019 1522   Pressure Injury 06/28/21 Coccyx Mid Deep Tissue Pressure Injury - Purple or maroon localized area of discolored intact skin or blood-filled blister due to damage of underlying soft tissue from pressure and/or shear. (Active)  06/28/21 2030  Location: Coccyx  Location Orientation: Mid  Staging: Deep Tissue Pressure Injury - Purple or maroon localized area of discolored intact skin or blood-filled blister due to damage of underlying soft tissue from pressure and/or shear.  Wound Description (Comments):   Present on Admission: Yes       Time coordinating discharge: Over 45 minutes  SIGNED: Deatra James, MD, FACP, [facility]. [facility],  Please use [email or web address] to Page If 7PM-7AM, please contact night-coverage [email or web address] [facility] 07/05/2021, 12:41 PM

## 2021-07-05 NOTE — Telephone Encounter (Signed)
Patient is being discharged from hospital today. Please arrange hospital follow-up with Dr. Laural Golden. Dx: PUD, ileus, colon/rectal polyps.

## 2021-07-06 DIAGNOSIS — N179 Acute kidney failure, unspecified: Secondary | ICD-10-CM

## 2021-07-06 LAB — BASIC METABOLIC PANEL
Anion gap: 7 (ref 5–15)
BUN: 15 mg/dL (ref 8–23)
CO2: 23 mmol/L (ref 22–32)
Calcium: 8.1 mg/dL — ABNORMAL LOW (ref 8.9–10.3)
Chloride: 110 mmol/L (ref 98–111)
Creatinine, Ser: 0.68 mg/dL (ref 0.44–1.00)
GFR, Estimated: >60 mL/min (ref >60)
Glucose, Bld: 91 mg/dL (ref 70–99)
Potassium: 3.6 mmol/L (ref 3.5–5.1)
Sodium: 140 mmol/L (ref 135–145)

## 2021-07-06 NOTE — Progress Notes (Signed)
Pt's foley removed yesterday afternoon (time was not noted).  Pt had not voided by 1833 and bladder scan showed 118m.  Next bladder scan at 0118 showed 918m  Bladder scan this AM at 0630 was 200101m Pt reports no urge to void and bladder is not distended.  I have some suspicion that pt is voiding when she has her liquid incontinent stools.  Pt states she had at least 3 stools yesterday.  Will discuss with oncoming nurse and follow up. StaAyesha MohairN RN CMSRN

## 2021-07-06 NOTE — Progress Notes (Signed)
PROGRESS NOTE  Debra Doyle [id number] DOB: August 27, 1929 DOA: 06/24/2021 PCP: [name], MD  Brief History:   87 y.o. female with a past medical history of esophagitis, peptic ulcer disease, coronary artery disease, hypertension, ischemic cardiomyopathy, who presented to [facility] ER yesterday evening with abdominal pain and distention.  Also notes back pain for the last 2 to 3 weeks.  States her abdomen has become more bloated over the last few months.  found to have gastric distention and questionable narrowing at the rectosigmoid junction with rectal stool ball.  NG tube was placed initially.  She underwent EGD and flex sig that showed nonbleeding gastric ulcer with normal histology, negative for H. pylori, and normal duodenum without presence of obstructive lesions.  Flex sig with a large amount of stool, but no sigmoid alterations, presence of a large rectal mass which was found to be a tubulovillous adenoma, no obstructive lesions  Overall clinical picture most consistent with generalized dysmotility of the GI system, likely worsened by poor mobility. She started on low-dose Reglan trial (tolerating well) and has been on MiraLAX 3 times daily and Senokot twice daily with improvement in bowel movements and decreasing abdominal distension. Patient has been tolerating full liquids without vomiting, worsening abd distension, or abd pain.  She was cleared for d/c 07/05/21 but d/c was delayed due to transport issues.  She remains stable for d/c on 8/;24  Assessment/Plan:  Ileus/GI dysmotility/Peptic ulcer disease -initially required NG decompression--removed 8/20 After repeat CT abd showed interval decompression of stomach -GI and general surgery were consulted -No surgery intervention necessary and not favored by family -treated with miralax and reglan tid -diet gradually advanced to full liquids which patient is tolerating -GI has set up outpatient followup -palliative  medicine followed patient and patient will go home with home hospice -continue PPI bid  5 cm anal mass  -8/17 flex sig--tubulovillous adenoma, no obstructive lesions -per discussion between patient, son, and palliative yesterday, the consensus at that time was not to pursue any additional work-up/intervention.  Acute DVT left arm -started apixaban 10 mg bid through 8/28, then 5 mg bid starting 8/29  Dehydration/AKI/HypoMagnesiemia/HypoKalemia/HypoNatremia-- -Due to poor p.o. intake, nausea vomiting-improved with treatment electrolytes and IV fluids - Na 124 >>>137, 139  Hypertension -Discontinued Norvasc and Coreg initially; now initiating labetalol 100 mg p.o. twice daily      Status is: Inpatient  Remains inpatient appropriate because:Inpatient level of care appropriate due to severity of illness  Dispo: The patient is from: Home              Anticipated d/c is to: Home              Patient currently is medically stable to d/c.   Difficult to place patient No        Family Communication:  caregiver updated at bedside 8/24  Consultants:  GI, palliative, general surgery  Code Status:  DNR  DVT Prophylaxis:  apixaban   Procedures: As Listed in Progress Note Above  Antibiotics: None      Subjective: Patient denies fevers, chills, headache, chest pain, dyspnea, nausea, vomiting, diarrhea, abdominal pain,    Objective: Vitals:   07/05/21 0802 07/05/21 1322 07/05/21 2057 07/06/21 0416  BP: (!) 174/114 (!) 145/104 (!) 149/103 (!) 153/92  Pulse: 92 100 91 79  Resp:  '18 19 18  '$ Temp:  98.6 F (37 C) 97.8 F (36.6 C) 98.2 F (36.8 C)  TempSrc:   Oral Oral  SpO2:  98% 97% 95%  Weight:      Height:        Intake/Output Summary (Last 24 hours) at 07/06/2021 1143 Last data filed at 07/05/2021 2200 Gross per 24 hour  Intake 120 ml  Output --  Net 120 ml   Weight change:  Exam:  General:  Pt is alert, follows commands appropriately, not in acute  distress HEENT: No icterus, No thrush, No neck mass, [place]/AT Cardiovascular: RRR, S1/S2, no rubs, no gallops Respiratory: bibasilar crackles. No wheeze Abdomen: Soft/+BS, non tender, non distended, no guarding Extremities: No edema, No lymphangitis, No petechiae, No rashes, no synovitis   Data Reviewed: I have personally reviewed following labs and imaging studies Basic Metabolic Panel: Recent Labs  Lab 07/02/21 0745 07/03/21 0553 07/04/21 0524 07/05/21 0459 07/06/21 0527  NA 138 137 137 138 140  K 4.3 4.1 3.6 3.7 3.6  CL 112* 112* 110 108 110  CO2 23 21* 20* 21* 23  GLUCOSE 155* 129* 133* 95 91  BUN 6* 6* '8 12 15  '$ CREATININE 0.67 0.61 0.56 0.72 0.68  CALCIUM 8.0* 8.0* 8.3* 8.2* 8.1*   Liver Function Tests: No results for input(s): AST, ALT, ALKPHOS, BILITOT, PROT, ALBUMIN in the last 168 hours. No results for input(s): LIPASE, AMYLASE in the last 168 hours. No results for input(s): AMMONIA in the last 168 hours. Coagulation Profile: No results for input(s): INR, PROTIME in the last 168 hours. CBC: Recent Labs  Lab 07/04/21 0524  WBC 9.6  NEUTROABS 8.5*  HGB 11.1*  HCT 33.3*  MCV 101.8*  PLT 188   Cardiac Enzymes: No results for input(s): CKTOTAL, CKMB, CKMBINDEX, TROPONINI in the last 168 hours. BNP: Invalid input(s): POCBNP CBG: Recent Labs  Lab 06/30/21 1110 06/30/21 1740 07/01/21 0755 07/01/21 1552 07/02/21 0002  GLUCAP 110* 168* 130* 134* 139*   HbA1C: No results for input(s): HGBA1C in the last 72 hours. Urine analysis:    Component Value Date/Time   COLORURINE YELLOW 01/28/2019 1522   APPEARANCEUR CLEAR 01/28/2019 1522   LABSPEC 1.038 (H) 01/28/2019 1522   PHURINE 5.0 01/28/2019 1522   GLUCOSEU NEGATIVE 01/28/2019 1522   HGBUR NEGATIVE 01/28/2019 1522   BILIRUBINUR NEGATIVE 01/28/2019 1522   KETONESUR 5 (A) 01/28/2019 1522   PROTEINUR NEGATIVE 01/28/2019 1522   UROBILINOGEN 0.2 12/10/2010 1555   NITRITE NEGATIVE 01/28/2019 1522    LEUKOCYTESUR NEGATIVE 01/28/2019 1522   Sepsis Labs: '@LABRCNTIP'$ (procalcitonin:4,lacticidven:4) )No results found for this or any previous visit (from the past 240 hour(s)).   Scheduled Meds:  apixaban  10 mg Oral BID   Followed by   Derrill Memo ON 07/11/2021] apixaban  5 mg Oral BID   Chlorhexidine Gluconate Cloth  6 each Topical Daily   labetalol  100 mg Oral BID   lidocaine  1 patch Transdermal Q24H   metoCLOPramide  5 mg Oral TID AC   pantoprazole  40 mg Oral BID   polyethylene glycol  17 g Oral TID   senna-docusate  2 tablet Oral BID   sodium phosphate  1 enema Rectal Once   Continuous Infusions:  Procedures/Studies: CT ABDOMEN PELVIS WO CONTRAST  Result Date: 06/30/2021 CLINICAL DATA:  Abdominal distension, rectal mass on sigmoidoscopy performed yesterday. EXAM: CT ABDOMEN AND PELVIS WITHOUT CONTRAST TECHNIQUE: Multidetector CT imaging of the abdomen and pelvis was performed following the standard protocol without IV contrast. COMPARISON:  Multiple exams, including 06/24/2021 FINDINGS: Lower chest: Small bilateral pleural effusions with atelectasis in  both lower lobes and mild atelectasis in the right middle lobe and lingula. Clips are noted along the upper glandular tissues of the right breast. Coronary atherosclerosis most notable in the left anterior descending coronary artery. Descending thoracic aortic atherosclerosis. Hepatobiliary: Poor visualization of the gallbladder. Otherwise unremarkable. Pancreas: Unremarkable Spleen: Unremarkable Adrenals/Urinary Tract: Foley catheter in the otherwise empty urinary bladder. No hydronephrosis or hydroureter. Normal renal contours. No renal calculi observed. Stomach/Bowel: A nasogastric tube terminates in the stomach body. There less distention of the stomach than on the prior exam. Increased thickening of the gastric wall probably related to the lesser degree of distension compared to previous. A rectal tube is in place and contrast was  administered via the rectal tube. 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 The rectum is fairly distended (possibly due to contrast filling), there is transition to smaller caliber at the rectosigmoid junction which partially fills with contrast medium for example on image 54 series 2. There is formed stool in the sigmoid colon along with various diverticular compatible with diverticulosis. The amount of formed stool in the colon is pronounced compatible with constipation and there is likely some redundancy of the sigmoid colon which is otherwise difficult to follow due to the paucity of intra-adipose tissue in this patient which makes separation of bowel loops difficult. The transverse colon does course down into the left lower quadrant and then back up towards the spleen, compatible with redundant transverse colon. No overtly dilated small bowel loops are identified. There is stranding in the perirectal space for example on image 65 of series 2 which may indicate inflammation or third spacing of fluid, some of this was also present on 06/24/2021. There is no extraluminal gas or substantial perirectal hematoma identified. Soft tissue density anterior to the rectum is compatible with uterine tissues. Vascular/Lymphatic: Aortoiliac atherosclerotic vascular disease. Reproductive: Uterus visualized although the contour is fairly indistinct. Other: There is potentially low-level mesenteric edema as well. Edema tracks into the left upper thigh region anteriorly and along the pubis. Musculoskeletal: There is a transverse fracture of the lower sternum on image 67 series 6. Age indeterminate nondisplaced rib fractures of the left fifth through tenth ribs. Stable prominent compression fracture at T10 and stable mild endplate compression fractures at L2  and along the superior endplate of L3. Stable mild superior endplate compression fracture at L4. IMPRESSION: 1. Interval decompression of the stomach with a nasogastric tube. 2. Rectal contrast was administered and L lined some anterior rectal irregularity probably corresponding to the biopsy tumor. 3. Prominent stool throughout the colon favors constipation. Redundant sigmoid colon and redundant transverse colon. 4. There is some increased presacral and perirectal stranding although this may be part of the or widespread third spacing of fluid process with small bilateral pleural effusions, bilateral subcutaneous edema, and mild mesenteric edema. 5. No findings of extraluminal gas or obvious abscess. Paucity of intra-adipose tissue makes separating adjacent bowel loops difficult. 6. Bibasilar atelectasis. 7. Subacute sternal fractures and subacute fractures of the left fifth through tenth ribs. 8. Prior compression fractures at T10, L2, L3, and L4. 9.  Aortic Atherosclerosis (ICD10-I70.0). Electronically Signed   By: Van Clines M.D.   On: 06/30/2021 14:57   DG Chest 1 View  Result Date: 07/04/2021 CLINICAL DATA:  Shortness of breath, abdominal pain and distension EXAM: CHEST  1 VIEW  COMPARISON:  Chest radiograph 07/01/2021 FINDINGS: The enteric catheter has been removed. The cardiomediastinal silhouette is grossly stable. The right lung apex is obscured by overlying soft tissues. There is a layering left pleural effusion which appears slightly decreased in size. Aeration of the left base has slightly improved. Left upper lung is well aerated. Confluent opacity in the right base has worsened in the interim. There may be a small right pleural effusion. There is no left pneumothorax. No right pneumothorax is identified, though as above the right apex is obscured. The bones are grossly stable. IMPRESSION: 1. Slight interval decrease in size of the left pleural effusion with improved aeration of the left  base. 2. Hazy opacity projecting over the right base has worsened in the interim and may reflect atelectasis or infection. There is a possible small right pleural effusion. Electronically Signed   By: Valetta Mole M.D.   On: 07/04/2021 14:35   DG Abd 1 View  Result Date: 07/04/2021 CLINICAL DATA:  Shortness of breath, abdominal pain and distension EXAM: ABDOMEN - 1 VIEW COMPARISON:  Abdominal radiographs 07/01/2021, CT abdomen/pelvis 06/30/2021 FINDINGS: There are multiple gas distended loops of bowel throughout the abdomen measuring up to 5.6 cm. The appearance is grossly similar to the prior abdominal radiographs. There is no definite free intraperitoneal air, though evaluation is degraded by lack of upright radiographs. There is multilevel degenerative change of the spine. IMPRESSION: Multiple gas distended loops of bowel throughout the abdomen are grossly similar to the prior radiographs. This may reflect persistent ileus, though mechanical obstruction cannot be excluded. Electronically Signed   By: Valetta Mole M.D.   On: 07/04/2021 14:39   DG Abd 1 View  Result Date: 07/01/2021 CLINICAL DATA:  Abdominal distension and pain. EXAM: ABDOMEN - 1 VIEW COMPARISON:  Comparison with June 27, 2021. FINDINGS: Gastric tube remains in the stomach. Increasing distension of bowel loops. Evacuation of contrast was present in the rectum compared to previous imaging. EKG leads project throughout the abdomen. Osteopenia. No acute skeletal process on limited assessment. IMPRESSION: Slight increase in distension of bowel loops. Evacuation of contrast in the rectum compared to previous imaging. Findings likely related to global ileus. Attention on follow-up. Electronically Signed   By: Zetta Bills M.D.   On: 07/01/2021 14:51   CT ABDOMEN PELVIS W CONTRAST  Result Date: 06/24/2021 CLINICAL DATA:  Abdominal distension EXAM: CT ABDOMEN AND PELVIS WITH CONTRAST TECHNIQUE: Multidetector CT imaging of the abdomen and  pelvis was performed using the standard protocol following bolus administration of intravenous contrast. CONTRAST:  81m OMNIPAQUE IOHEXOL 350 MG/ML SOLN COMPARISON:  CT 01/28/2019 FINDINGS: Lower chest: Bibasilar atelectasis. Heart size is mildly enlarged. Coronary artery calcifications are seen. Hepatobiliary: No focal liver abnormality is seen. No gallstones, gallbladder wall thickening, or biliary dilatation. Pancreas: Mildly atrophic, but appears grossly unremarkable. Spleen: Normal in size without focal abnormality. Adrenals/Urinary Tract: Unremarkable adrenal glands. Kidneys enhance symmetrically without focal lesion, stone, or hydronephrosis. Ureters are nondilated. Urinary bladder appears unremarkable. Stomach/Bowel: Markedly dilated stomach with fluid and air. No evidence of mechanical outlet obstruction. There is a moderate-sized rectal stool ball. Short segment area of narrowing at the rectosigmoid junction with a moderately dilated distal sigmoid colon. Moderate volume of stool throughout the remaining colon. What appears to be a normal appendix is seen in the right lower quadrant (series 4, image 51). No dilated loops of small bowel are seen within the abdomen. Vascular/Lymphatic: Atherosclerotic calcifications are present throughout the aortoiliac axis. No abdominopelvic lymphadenopathy.  Reproductive: Uterus and bilateral adnexa are unremarkable. Other: No free fluid. No abdominopelvic fluid collection. No pneumoperitoneum. No abdominal wall hernia. Musculoskeletal: Diffuse osseous demineralization. Vertebra plana deformity of the T10 vertebral body, progressed from 2020. Mild chronic L2 compression deformity. IMPRESSION: 1. Markedly dilated stomach with fluid and air. No evidence of mechanical outlet obstruction. 2. Short segment area of narrowing at the rectosigmoid junction with a moderately dilated distal sigmoid colon. Findings may represent a focal area of peristalsis, although a colonic mass is  not entirely excluded. A follow-up colonoscopy could be considered. 3. Moderate-sized rectal stool ball. 4. Vertebra plana deformity of the T10 vertebral body, progressed from 2020. Mild chronic L2 compression deformity. 5. Aortic atherosclerosis (ICD10-I70.0). Electronically Signed   By: Davina Poke D.O.   On: 06/24/2021 20:07   US Venous Img Upper Uni Left (DVT)  Result Date: 07/04/2021 CLINICAL DATA:  Left upper extremity edema. EXAM: LEFT UPPER EXTREMITY VENOUS DOPPLER ULTRASOUND TECHNIQUE: Gray-scale sonography with graded compression, as well as color Doppler and duplex ultrasound were performed to evaluate the upper extremity deep venous system from the level of the subclavian vein and including the jugular, axillary, basilic, radial, ulnar and upper cephalic vein. Spectral Doppler was utilized to evaluate flow at rest and with distal augmentation maneuvers. COMPARISON:  None. FINDINGS: Contralateral Subclavian Vein: Respiratory phasicity is normal and symmetric with the symptomatic side. No evidence of thrombus. Normal compressibility. Internal Jugular Vein: There is some flow in the visualized left internal jugular vein. Echogenicity at the base of the left internal jugular vein may relate to slow flow or partial nonocclusive thrombus. Subclavian Vein: Visualized segment of the left subclavian vein demonstrates occlusive thrombus. Axillary Vein: Nonocclusive thrombus in a segment of the visualized left axillary vein. Cephalic Vein: The cephalic vein demonstrates occlusive thrombus. Basilic Vein: Occlusive thrombus visualized in the left basilic vein. Brachial Veins: No evidence of thrombus. Normal compressibility, respiratory phasicity and response to augmentation. Radial Veins: No evidence of thrombus. Normal compressibility, respiratory phasicity and response to augmentation. Ulnar Veins: No evidence of thrombus. Normal compressibility, respiratory phasicity and response to augmentation. Venous  Reflux:  None visualized. Other Findings:  None visualized. IMPRESSION: Positive for DVT of the left upper extremity localizing to the visualized subclavian vein and a segment of the axillary vein. There is superficial thrombophlebitis of the cephalic vein and basilic vein. The base of the left internal jugular vein demonstrates slow flow and there may be some component of partial nonocclusive thrombus. These results will be called to the ordering clinician or representative by the Radiologist Assistant, and communication documented in the PACS or [employer]. Electronically Signed   By: Aletta Edouard M.D.   On: 07/04/2021 13:20   DG CHEST PORT 1 VIEW  Result Date: 07/01/2021 CLINICAL DATA:  Shortness of breath abdominal pain, negative COVID assessment on 06/25/2021. EXAM: PORTABLE CHEST 1 VIEW COMPARISON:  June 26, 2021 and CT of the abdomen and pelvis from June 30, 2021. FINDINGS: Gastric tube in place, tip likely in mid stomach, side port below the level of the GE junction in the proximal to mid stomach. EKG leads project over the chest. Cardiomediastinal contours are stable. There is further consolidative change at the LEFT lung base in the retrocardiac region obscuring LEFT hemidiaphragm. Changes of percutaneous coronary intervention along the LEFT heart border as before. No lobar consolidative changes.  No effusion on the RIGHT. Diffuse bowel distension in the upper abdomen is similar to prior imaging. No acute skeletal process on  limited assessment. IMPRESSION: 1. Increasing consolidative change at the LEFT lung base in the retrocardiac region. May reflect developing pneumonia or volume loss with pleural effusion. 2. Gastric tube in place, tip likely in mid stomach. Electronically Signed   By: Zetta Bills M.D.   On: 07/01/2021 14:49   DG CHEST PORT 1 VIEW  Result Date: 06/26/2021 CLINICAL DATA:  NG tube placement EXAM: PORTABLE CHEST 1 VIEW COMPARISON:  None. FINDINGS: NG tube with tip  in the gastric body and side port below the GE junction. Low lung volumes.  Cardiac stent noted. IMPRESSION: NG tube with tip in stomach. Electronically Signed   By: Suzy Bouchard M.D.   On: 06/26/2021 09:17   DG CHEST PORT 1 VIEW  Result Date: 06/25/2021 CLINICAL DATA:  Cough, NG tube placement EXAM: PORTABLE CHEST 1 VIEW COMPARISON:  01/29/2019 FINDINGS: Lungs are essentially clear.  No pleural effusion or pneumothorax. The heart is normal in size.  Coronary stent. Enteric tube courses into the mid stomach. IMPRESSION: Enteric tube courses into the mid stomach. Electronically Signed   By: Julian Hy M.D.   On: 06/25/2021 02:44   DG ABD ACUTE 2+V W 1V CHEST  Result Date: 06/27/2021 CLINICAL DATA:  Abdominal pain and gastric distension. Patient with severe pain throughout. Hx of HTN and MI. Negative covid-19 test x 3 days ago. EXAM: DG ABDOMEN ACUTE WITH 1 VIEW CHEST COMPARISON:  the previous day's study FINDINGS: Low lung volumes with patchy subsegmental atelectasis at the lung bases. Heart size upper limits normal for technique. Aortic Atherosclerosis (ICD10-170.0). Coronary stent. Blunting of the left lateral costophrenic angle suggesting small effusion. No pneumothorax. No free air. Normal bowel gas pattern. Nasogastric tube extends into the decompressed stomach. Urinary bladder is distended. Regional bones unremarkable. IMPRESSION: 1. Distended urinary bladder. 2. Low volumes with patchy subsegmental atelectasis at the lung bases, possible small left effusion. 3. For nasogastric tube to the decompressed stomach. Electronically Signed   By: Lucrezia Europe M.D.   On: 06/27/2021 07:29   DG ABD ACUTE 2+V W 1V CHEST  Result Date: 06/26/2021 CLINICAL DATA:  87 year old female with abdominal pain. EXAM: DG ABDOMEN ACUTE WITH 1 VIEW CHEST COMPARISON:  CT Abdomen and Pelvis 06/24/2021 and earlier. FINDINGS: Portable upright and supine views at 0442 hours. Enteric tube has been placed into the stomach,  with evidence of resolved gastric distension since the recent CT. Mildly distended urinary bladder with excreted IV contrast. Continued low lung volumes. No pneumothorax or pneumoperitoneum identified. Increased hypo ventilation at the left lung base. Stable cardiac size and mediastinal contours. Gas-filled bowel loops elsewhere in the abdomen and pelvis appear stable from the recent CT. Osteopenia. No acute osseous abnormality identified. IMPRESSION: 1. Enteric tube placed into the stomach with evidence of gastric decompression since the recent CT. 2. Otherwise stable bowel gas pattern.  No pneumoperitoneum. 3. Interval decreased ventilation at the left lung base, perhaps atelectasis. Electronically Signed   By: Genevie Ann M.D.   On: 06/26/2021 05:25   DG Abdomen Acute W/Chest  Result Date: 06/24/2021 CLINICAL DATA:  Abdominal pain and distension. EXAM: DG ABDOMEN ACUTE WITH 1 VIEW CHEST COMPARISON:  CT 01/28/2019 FINDINGS: Anti lordotic positioning. Low lung volumes. Borderline cardiomegaly with coronary stent. No acute airspace disease or pleural effusion. No pneumothorax. No free intra-abdominal air. Marked gaseous gastric distension, similar gastric distension seen on prior exam when the stomach is fluid-filled. Large volume of colonic stool. There is also likely gaseous distension of transverse colon, although  air-filled structure coursing across the mid abdomen may be distended stomach. No obvious small bowel dilatation. Prominent vascular calcifications are seen. The bones are diffusely under mineralized. No obvious acute osseous abnormality. Surgical clips in the right breast. IMPRESSION: 1. Marked gaseous gastric distension. The stomach was previously prominently distended with fluid. Air-filled structure coursing transversely across the mid abdomen may be gaseous distension of transverse colon or gastric distension. 2. Large volume of colonic stool. 3. Low lung volumes without acute abnormality in the  chest. Electronically Signed   By: Keith Rake M.D.   On: 06/24/2021 17:59    Orson Eva, DO  [facility]  If 7PM-7AM, please contact night-coverage [email or web address] Password [facility] 07/06/2021, 11:43 AM   LOS: 12 days

## 2021-07-06 NOTE — Progress Notes (Signed)
Discharge instructions reviewed with patient and patient's son and caregiver.  Both verbalized understanding of instructions. Patient discharged home with family in stable condition.

## 2021-07-11 NOTE — Discharge Summary (Signed)
Physician Discharge Summary [facility]    Patient: Debra Doyle                   Admit date: 06/24/2021   DOB: [birth year]             Discharge date:07/11/2021/11:03 AM [id number]                          PCP: Debra Blitz, MD  Disposition: HOME with Hospice  Recommendations for Outpatient Follow-up:   Follow up: Gastroenterologist in 2-3 weeks for further evaluation possible outpatient colonoscopy Follow-up with general PCP in 2 to 3 weeks Continue with home palliative care--referrals have been made  Discharge Condition: Stable   Code Status:   Code Status: Prior  Diet recommendation: Soft, liquid healthy diet  Patient was not discharged on the day of discharge due to transportation issue: Follow-up discharged on 824 in stable condition.  Please see Dr. Acquanetta Belling note for further details as no changes to this note    Discharge Diagnoses:    Principal Problem:   Gastric distention Active Problems:   AKI (acute kidney injury) ([place])   Hyponatremia   Mild protein-calorie malnutrition ([facility])   Hypotension   Abdominal distension   Rectal mass   Constipation   Shortness of breath   Ileus, unspecified ([place])   History of Present Illness/ Hospital Course Debra Doyle Summary:    Brief Summary:- 87 y.o. female, with history of coronary artery disease/Prior MI/ischemic cardiomyopathy and HTN--admitted on 06/25/2021 with abdominal pain nausea and vomiting and found to have significant gastric distention on CT abdomen and pelvis        Abdominal pain with Nausea and Vomiting -abdominal distention -Some improvement noted, with abdominal pain, nausea vomiting has improved   -Reporting 4 stools past 24 hours   -KUB consistent with ileus-but patient is passing gas and has had BMs, no signs of obstruction - on scheduled p.o. Reglan   - Gasteric Ulcer/5 cm anal mass - significant gastric distention on CT abdomen and Pelvis.   Revealing no retrosigmoid obstruction with  rectal mass -NG tube replaced on 06/26/21 -  D/Ced 07/02/2021 --Tolerating p.o. -Continue MiraLAX, ambulation, following imaging, as needed Reglan   Per surgery: -No surgical intervention, she can proceed with colonoscopy once rectal polyp was removed per general surgery - per GI:  colonoscopy as an outpatient     5 cm anal mass - EGD and colonoscopy on EGD and flex sig- - EGD: Nonbleeding Gastric ulcer, --Flex sig shows lower: anal mass 5 cm from anal verge --- status postbiopsy pending pathology   -Biopsy results --Path report -benign nonmalignant mass   Short segment area of Narrowing  S/p EGD and flex sig on 8/16/2022g at the Rectosigmoid junction with a moderately dilated distal sigmoid colon--- rectal stool ball noted on CT abdomen and pelvis as well  -At this point profound stricture is ruled out with CT scan with contrast-   -Malignancy of the mass has been ruled out -GI recommending colonoscopy as an outpatient in the future     -Continue laxatives --- Surgery following peripherally     Left arm edema  -Ultrasound positive for DVT, initiated Eliquis -Recommended elevation...    Dehydration/AKI/HypoMagnesiemia/HypoKalemia/HypoNatremia-- -Due to poor p.o. intake, nausea vomiting-improved with treatment electrolytes and IV fluids - Na 124 >>>137, 139 -Resolved but continue monitoring   4)Transiet Hypotension- -Resolved, sepsis ruled out AM Cortisol is 26.2, with no leukocytosis PCT is  0.23 Lactic acid 2.8 >>1.5 (lactic acidosis may be due to dehydration and poor tissue perfusion) -Discontinued vancomycin, cefepime and Flagyl   5) Hypertension -Discontinued Norvasc and Coreg, initiating labetalol 100 mg p.o. twice daily -Monitoring and titrating medication accordingly -Continue labetalol   Mild respite distress -On 2 L of oxygen, satting 96% -Chest x-ray questionable possible infiltrate but afebrile normotensive with no leukocytosis Chest x-ray may be obscured  due to abdominal distention, patient leaning forward unable to obtain a PA lateral-as patient not able to completely set up -Questionable infiltrate, no clear indication of infection or pneumonia -Was not treated with any antibiotic at this time               Left arm DVT, initiated Eliquis, told the recommended treatment plan approximately 3 months, to be reevaluated by PCP to discuss length of treatment     SKIN assessment: I agree with skin assessment and plan as outlined below: Pressure Injury 06/28/21 Coccyx Mid Deep Tissue Pressure Injury - Purple or maroon localized area of discolored intact skin or blood-filled blister due to damage of underlying soft tissue from pressure and/or shear. (Active)  06/28/21 2030  Location: Coccyx  Location Orientation: Mid  Staging: Deep Tissue Pressure Injury - Purple or maroon localized area of discolored intact skin or blood-filled blister due to damage of underlying soft tissue from pressure and/or shear.  Wound Description (Comments):   Present on Admission: Yes       Ethics -DNR/DNI Prognosis poor due to age, multiple comorbidities, decreased mobility decreased p.o. intake continue GI issues Discussed with family, his son -POA Disposition: The patient is from: Home              Anticipated d/c is to: Home with home health care, palliative care consultation    Code Status :  -  Code Status: DNR    Family Communication:   (patient is alert, awake and coherent)  Son Debra Doyle at [phone] -discussed with son and daughter-in-law regarding the findings, steady decline refusing to participate in any physical poor p.o. intake, consistent abdominal pain, distention, multiple comorbidities ... they Agreed to palliative care -which services they have excepted to be continued at home. Consults  :  Gi/surg.      Discharge Instructions:   Discharge Instructions     Activity as tolerated - No restrictions   Complete by: As  directed    Call MD for:  difficulty breathing, headache or visual disturbances   Complete by: As directed    Call MD for:  persistant dizziness or light-headedness   Complete by: As directed    Call MD for:  persistant nausea and vomiting   Complete by: As directed    Call MD for:  redness, tenderness, or signs of infection (pain, swelling, redness, odor or green/yellow discharge around incision site)   Complete by: As directed    Call MD for:  temperature >100.4   Complete by: As directed    Diet - low sodium heart healthy   Complete by: As directed    Discharge instructions   Complete by: As directed    Continue current recommended medication, ambulate as much as possible, will help with bowel movement, gas, continue current laxative stool softeners, hold with any loose stool or diarrhea.   Discharge wound care:   Complete by: As directed    Per nursing instructions, continue daily zinc oxide or equivalent cream to avoid further skin breakdown   Increase activity slowly  Complete by: As directed         Medication List     STOP taking these medications    aspirin EC 81 MG tablet   carvedilol 6.25 MG tablet Commonly known as: COREG   losartan 50 MG tablet Commonly known as: COZAAR   spironolactone 25 MG tablet Commonly known as: ALDACTONE       TAKE these medications    acetaminophen 325 MG tablet Commonly known as: TYLENOL Take 2 tablets (650 mg total) by mouth every 6 (six) hours as needed for fever or headache (pain).   apixaban 5 MG Tabs tablet Commonly known as: ELIQUIS Take 2 tablets (10 mg total) by mouth 2 (two) times daily for 10 days.   apixaban 5 MG Tabs tablet Commonly known as: ELIQUIS Take 1 tablet (5 mg total) by mouth 2 (two) times daily.   atorvastatin 40 MG tablet Commonly known as: LIPITOR TAKE 1 TABLET BY MOUTH EVERY DAY AT 6:00 PM What changed: See the new instructions.   BILBERRY PLUS PO Take 1 capsule by mouth daily.    cyanocobalamin 1000 MCG tablet Take 1,000 mcg by mouth daily.   furosemide 40 MG tablet Commonly known as: LASIX Take 0.5 tablets (20 mg total) by mouth daily. What changed: how much to take   ketoconazole 2 % cream Commonly known as: NIZORAL Apply 1 application topically 2 (two) times daily.   labetalol 100 MG tablet Commonly known as: NORMODYNE Take 1 tablet (100 mg total) by mouth 2 (two) times daily.   LUTEIN PO Take 1 capsule by mouth daily.   metoCLOPramide 5 MG tablet Commonly known as: REGLAN Take 1 tablet (5 mg total) by mouth 3 (three) times daily before meals for 10 days.   nitroGLYCERIN 0.4 MG SL tablet Commonly known as: NITROSTAT Place 0.4 mg under the tongue as needed for chest pain. Place one tablet under the tongue every 5 minutes x 3 doses max as needed for chest pain.  Contact MD or 911 with unrelieved pain   pantoprazole 40 MG tablet Commonly known as: PROTONIX Take 1 tablet (40 mg total) by mouth 2 (two) times daily. What changed: when to take this   polyethylene glycol 17 g packet Commonly known as: MIRALAX / GLYCOLAX Take 17 g by mouth 3 (three) times daily.   senna 8.6 MG Tabs tablet Commonly known as: SENOKOT Take 1 tablet by mouth daily as needed for mild constipation.   senna-docusate 8.6-50 MG tablet Commonly known as: Senokot-S Take 2 tablets by mouth 2 (two) times daily.   vitamin C 500 MG tablet Commonly known as: ASCORBIC ACID Take 500 mg by mouth daily.   Zinc Oxide 10 % Oint Apply 1 application topically daily as needed (buttocks).        No Known Allergies   Procedures /Studies:   CT ABDOMEN PELVIS WO CONTRAST  Result Date: 06/30/2021 CLINICAL DATA:  Abdominal distension, rectal mass on sigmoidoscopy performed yesterday. EXAM: CT ABDOMEN AND PELVIS WITHOUT CONTRAST TECHNIQUE: Multidetector CT imaging of the abdomen and pelvis was performed following the standard protocol without IV contrast. COMPARISON:  Multiple  exams, including 06/24/2021 FINDINGS: Lower chest: Small bilateral pleural effusions with atelectasis in both lower lobes and mild atelectasis in the right middle lobe and lingula. Clips are noted along the upper glandular tissues of the right breast. Coronary atherosclerosis most notable in the left anterior descending coronary artery. Descending thoracic aortic atherosclerosis. Hepatobiliary: Poor visualization of the gallbladder. Otherwise unremarkable. Pancreas: Unremarkable  Spleen: Unremarkable Adrenals/Urinary Tract: Foley catheter in the otherwise empty urinary bladder. No hydronephrosis or hydroureter. Normal renal contours. No renal calculi observed. Stomach/Bowel: A nasogastric tube terminates in the stomach body. There less distention of the stomach than on the prior exam. Increased thickening of the gastric wall probably related to the lesser degree of distension compared to previous. A rectal tube is in place and contrast was administered via the rectal tube. 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 The rectum is fairly distended (possibly due to contrast filling), there is transition to smaller caliber at the rectosigmoid junction which partially fills with contrast medium for example on image 54 series 2. There is formed stool in the sigmoid colon along with various diverticular compatible with diverticulosis. The amount of formed stool in the colon is pronounced compatible with constipation and there is likely some redundancy of the sigmoid colon which is otherwise difficult to follow due to the paucity of intra-adipose tissue in this patient which makes separation of bowel loops difficult. The transverse colon does course down into the left lower quadrant and then back up towards the spleen, compatible with redundant transverse  colon. No overtly dilated small bowel loops are identified. There is stranding in the perirectal space for example on image 65 of series 2 which may indicate inflammation or third spacing of fluid, some of this was also present on 06/24/2021. There is no extraluminal gas or substantial perirectal hematoma identified. Soft tissue density anterior to the rectum is compatible with uterine tissues. Vascular/Lymphatic: Aortoiliac atherosclerotic vascular disease. Reproductive: Uterus visualized although the contour is fairly indistinct. Other: There is potentially low-level mesenteric edema as well. Edema tracks into the left upper thigh region anteriorly and along the pubis. Musculoskeletal: There is a transverse fracture of the lower sternum on image 67 series 6. Age indeterminate nondisplaced rib fractures of the left fifth through tenth ribs. Stable prominent compression fracture at T10 and stable mild endplate compression fractures at L2 and along the superior endplate of L3. Stable mild superior endplate compression fracture at L4. IMPRESSION: 1. Interval decompression of the stomach with a nasogastric tube. 2. Rectal contrast was administered and L lined some anterior rectal irregularity probably corresponding to the biopsy tumor. 3. Prominent stool throughout the colon favors constipation. Redundant sigmoid colon and redundant transverse colon. 4. There is some increased presacral and perirectal stranding although this may be part of the or widespread third spacing of fluid process with small bilateral pleural effusions, bilateral subcutaneous edema, and mild mesenteric edema. 5. No findings of extraluminal gas or obvious abscess. Paucity of intra-adipose tissue makes separating adjacent bowel loops difficult. 6. Bibasilar atelectasis. 7. Subacute sternal fractures and subacute fractures of the left fifth through tenth ribs. 8. Prior compression fractures at T10, L2, L3, and L4. 9.  Aortic Atherosclerosis  (ICD10-I70.0). Electronically Signed   By: Van Clines M.D.   On: 06/30/2021 14:57   DG Chest 1 View  Result Date: 07/04/2021 CLINICAL DATA:  Shortness of breath, abdominal pain and distension EXAM: CHEST  1 VIEW COMPARISON:  Chest radiograph 07/01/2021 FINDINGS: The enteric catheter has been removed. The cardiomediastinal silhouette is grossly stable. The right lung apex is obscured by overlying soft tissues. There is a layering left pleural effusion which appears slightly decreased in size. Aeration of the left base has slightly improved. Left  upper lung is well aerated. Confluent opacity in the right base has worsened in the interim. There may be a small right pleural effusion. There is no left pneumothorax. No right pneumothorax is identified, though as above the right apex is obscured. The bones are grossly stable. IMPRESSION: 1. Slight interval decrease in size of the left pleural effusion with improved aeration of the left base. 2. Hazy opacity projecting over the right base has worsened in the interim and may reflect atelectasis or infection. There is a possible small right pleural effusion. Electronically Signed   By: Valetta Mole M.D.   On: 07/04/2021 14:35   DG Abd 1 View  Result Date: 07/04/2021 CLINICAL DATA:  Shortness of breath, abdominal pain and distension EXAM: ABDOMEN - 1 VIEW COMPARISON:  Abdominal radiographs 07/01/2021, CT abdomen/pelvis 06/30/2021 FINDINGS: There are multiple gas distended loops of bowel throughout the abdomen measuring up to 5.6 cm. The appearance is grossly similar to the prior abdominal radiographs. There is no definite free intraperitoneal air, though evaluation is degraded by lack of upright radiographs. There is multilevel degenerative change of the spine. IMPRESSION: Multiple gas distended loops of bowel throughout the abdomen are grossly similar to the prior radiographs. This may reflect persistent ileus, though mechanical obstruction cannot be excluded.  Electronically Signed   By: Valetta Mole M.D.   On: 07/04/2021 14:39   DG Abd 1 View  Result Date: 07/01/2021 CLINICAL DATA:  Abdominal distension and pain. EXAM: ABDOMEN - 1 VIEW COMPARISON:  Comparison with June 27, 2021. FINDINGS: Gastric tube remains in the stomach. Increasing distension of bowel loops. Evacuation of contrast was present in the rectum compared to previous imaging. EKG leads project throughout the abdomen. Osteopenia. No acute skeletal process on limited assessment. IMPRESSION: Slight increase in distension of bowel loops. Evacuation of contrast in the rectum compared to previous imaging. Findings likely related to global ileus. Attention on follow-up. Electronically Signed   By: Zetta Bills M.D.   On: 07/01/2021 14:51   CT ABDOMEN PELVIS W CONTRAST  Result Date: 06/24/2021 CLINICAL DATA:  Abdominal distension EXAM: CT ABDOMEN AND PELVIS WITH CONTRAST TECHNIQUE: Multidetector CT imaging of the abdomen and pelvis was performed using the standard protocol following bolus administration of intravenous contrast. CONTRAST:  10m OMNIPAQUE IOHEXOL 350 MG/ML SOLN COMPARISON:  CT 01/28/2019 FINDINGS: Lower chest: Bibasilar atelectasis. Heart size is mildly enlarged. Coronary artery calcifications are seen. Hepatobiliary: No focal liver abnormality is seen. No gallstones, gallbladder wall thickening, or biliary dilatation. Pancreas: Mildly atrophic, but appears grossly unremarkable. Spleen: Normal in size without focal abnormality. Adrenals/Urinary Tract: Unremarkable adrenal glands. Kidneys enhance symmetrically without focal lesion, stone, or hydronephrosis. Ureters are nondilated. Urinary bladder appears unremarkable. Stomach/Bowel: Markedly dilated stomach with fluid and air. No evidence of mechanical outlet obstruction. There is a moderate-sized rectal stool ball. Short segment area of narrowing at the rectosigmoid junction with a moderately dilated distal sigmoid colon. Moderate volume  of stool throughout the remaining colon. What appears to be a normal appendix is seen in the right lower quadrant (series 4, image 51). No dilated loops of small bowel are seen within the abdomen. Vascular/Lymphatic: Atherosclerotic calcifications are present throughout the aortoiliac axis. No abdominopelvic lymphadenopathy. Reproductive: Uterus and bilateral adnexa are unremarkable. Other: No free fluid. No abdominopelvic fluid collection. No pneumoperitoneum. No abdominal wall hernia. Musculoskeletal: Diffuse osseous demineralization. Vertebra plana deformity of the T10 vertebral body, progressed from 2020. Mild chronic L2 compression deformity. IMPRESSION: 1. Markedly dilated stomach with fluid and air.  No evidence of mechanical outlet obstruction. 2. Short segment area of narrowing at the rectosigmoid junction with a moderately dilated distal sigmoid colon. Findings may represent a focal area of peristalsis, although a colonic mass is not entirely excluded. A follow-up colonoscopy could be considered. 3. Moderate-sized rectal stool ball. 4. Vertebra plana deformity of the T10 vertebral body, progressed from 2020. Mild chronic L2 compression deformity. 5. Aortic atherosclerosis (ICD10-I70.0). Electronically Signed   By: Davina Poke D.O.   On: 06/24/2021 20:07   US Venous Img Upper Uni Left (DVT)  Result Date: 07/04/2021 CLINICAL DATA:  Left upper extremity edema. EXAM: LEFT UPPER EXTREMITY VENOUS DOPPLER ULTRASOUND TECHNIQUE: Gray-scale sonography with graded compression, as well as color Doppler and duplex ultrasound were performed to evaluate the upper extremity deep venous system from the level of the subclavian vein and including the jugular, axillary, basilic, radial, ulnar and upper cephalic vein. Spectral Doppler was utilized to evaluate flow at rest and with distal augmentation maneuvers. COMPARISON:  None. FINDINGS: Contralateral Subclavian Vein: Respiratory phasicity is normal and symmetric  with the symptomatic side. No evidence of thrombus. Normal compressibility. Internal Jugular Vein: There is some flow in the visualized left internal jugular vein. Echogenicity at the base of the left internal jugular vein may relate to slow flow or partial nonocclusive thrombus. Subclavian Vein: Visualized segment of the left subclavian vein demonstrates occlusive thrombus. Axillary Vein: Nonocclusive thrombus in a segment of the visualized left axillary vein. Cephalic Vein: The cephalic vein demonstrates occlusive thrombus. Basilic Vein: Occlusive thrombus visualized in the left basilic vein. Brachial Veins: No evidence of thrombus. Normal compressibility, respiratory phasicity and response to augmentation. Radial Veins: No evidence of thrombus. Normal compressibility, respiratory phasicity and response to augmentation. Ulnar Veins: No evidence of thrombus. Normal compressibility, respiratory phasicity and response to augmentation. Venous Reflux:  None visualized. Other Findings:  None visualized. IMPRESSION: Positive for DVT of the left upper extremity localizing to the visualized subclavian vein and a segment of the axillary vein. There is superficial thrombophlebitis of the cephalic vein and basilic vein. The base of the left internal jugular vein demonstrates slow flow and there may be some component of partial nonocclusive thrombus. These results will be called to the ordering clinician or representative by the Radiologist Assistant, and communication documented in the PACS or [employer]. Electronically Signed   By: Aletta Edouard M.D.   On: 07/04/2021 13:20   DG CHEST PORT 1 VIEW  Result Date: 07/01/2021 CLINICAL DATA:  Shortness of breath abdominal pain, negative COVID assessment on 06/25/2021. EXAM: PORTABLE CHEST 1 VIEW COMPARISON:  June 26, 2021 and CT of the abdomen and pelvis from June 30, 2021. FINDINGS: Gastric tube in place, tip likely in mid stomach, side port below the level of the  GE junction in the proximal to mid stomach. EKG leads project over the chest. Cardiomediastinal contours are stable. There is further consolidative change at the LEFT lung base in the retrocardiac region obscuring LEFT hemidiaphragm. Changes of percutaneous coronary intervention along the LEFT heart border as before. No lobar consolidative changes.  No effusion on the RIGHT. Diffuse bowel distension in the upper abdomen is similar to prior imaging. No acute skeletal process on limited assessment. IMPRESSION: 1. Increasing consolidative change at the LEFT lung base in the retrocardiac region. May reflect developing pneumonia or volume loss with pleural effusion. 2. Gastric tube in place, tip likely in mid stomach. Electronically Signed   By: Zetta Bills M.D.   On: 07/01/2021 14:49  DG CHEST PORT 1 VIEW  Result Date: 06/26/2021 CLINICAL DATA:  NG tube placement EXAM: PORTABLE CHEST 1 VIEW COMPARISON:  None. FINDINGS: NG tube with tip in the gastric body and side port below the GE junction. Low lung volumes.  Cardiac stent noted. IMPRESSION: NG tube with tip in stomach. Electronically Signed   By: Suzy Bouchard M.D.   On: 06/26/2021 09:17   DG CHEST PORT 1 VIEW  Result Date: 06/25/2021 CLINICAL DATA:  Cough, NG tube placement EXAM: PORTABLE CHEST 1 VIEW COMPARISON:  01/29/2019 FINDINGS: Lungs are essentially clear.  No pleural effusion or pneumothorax. The heart is normal in size.  Coronary stent. Enteric tube courses into the mid stomach. IMPRESSION: Enteric tube courses into the mid stomach. Electronically Signed   By: Julian Hy M.D.   On: 06/25/2021 02:44   DG ABD ACUTE 2+V W 1V CHEST  Result Date: 06/27/2021 CLINICAL DATA:  Abdominal pain and gastric distension. Patient with severe pain throughout. Hx of HTN and MI. Negative covid-19 test x 3 days ago. EXAM: DG ABDOMEN ACUTE WITH 1 VIEW CHEST COMPARISON:  the previous day's study FINDINGS: Low lung volumes with patchy subsegmental  atelectasis at the lung bases. Heart size upper limits normal for technique. Aortic Atherosclerosis (ICD10-170.0). Coronary stent. Blunting of the left lateral costophrenic angle suggesting small effusion. No pneumothorax. No free air. Normal bowel gas pattern. Nasogastric tube extends into the decompressed stomach. Urinary bladder is distended. Regional bones unremarkable. IMPRESSION: 1. Distended urinary bladder. 2. Low volumes with patchy subsegmental atelectasis at the lung bases, possible small left effusion. 3. For nasogastric tube to the decompressed stomach. Electronically Signed   By: Lucrezia Europe M.D.   On: 06/27/2021 07:29   DG ABD ACUTE 2+V W 1V CHEST  Result Date: 06/26/2021 CLINICAL DATA:  87 year old female with abdominal pain. EXAM: DG ABDOMEN ACUTE WITH 1 VIEW CHEST COMPARISON:  CT Abdomen and Pelvis 06/24/2021 and earlier. FINDINGS: Portable upright and supine views at 0442 hours. Enteric tube has been placed into the stomach, with evidence of resolved gastric distension since the recent CT. Mildly distended urinary bladder with excreted IV contrast. Continued low lung volumes. No pneumothorax or pneumoperitoneum identified. Increased hypo ventilation at the left lung base. Stable cardiac size and mediastinal contours. Gas-filled bowel loops elsewhere in the abdomen and pelvis appear stable from the recent CT. Osteopenia. No acute osseous abnormality identified. IMPRESSION: 1. Enteric tube placed into the stomach with evidence of gastric decompression since the recent CT. 2. Otherwise stable bowel gas pattern.  No pneumoperitoneum. 3. Interval decreased ventilation at the left lung base, perhaps atelectasis. Electronically Signed   By: Genevie Ann M.D.   On: 06/26/2021 05:25   DG Abdomen Acute W/Chest  Result Date: 06/24/2021 CLINICAL DATA:  Abdominal pain and distension. EXAM: DG ABDOMEN ACUTE WITH 1 VIEW CHEST COMPARISON:  CT 01/28/2019 FINDINGS: Anti lordotic positioning. Low lung volumes.  Borderline cardiomegaly with coronary stent. No acute airspace disease or pleural effusion. No pneumothorax. No free intra-abdominal air. Marked gaseous gastric distension, similar gastric distension seen on prior exam when the stomach is fluid-filled. Large volume of colonic stool. There is also likely gaseous distension of transverse colon, although air-filled structure coursing across the mid abdomen may be distended stomach. No obvious small bowel dilatation. Prominent vascular calcifications are seen. The bones are diffusely under mineralized. No obvious acute osseous abnormality. Surgical clips in the right breast. IMPRESSION: 1. Marked gaseous gastric distension. The stomach was previously prominently distended with fluid.  Air-filled structure coursing transversely across the mid abdomen may be gaseous distension of transverse colon or gastric distension. 2. Large volume of colonic stool. 3. Low lung volumes without acute abnormality in the chest. Electronically Signed   By: Keith Rake M.D.   On: 06/24/2021 17:59    Subjective:   Patient was seen and examined 07/11/2021, 11:03 AM Patient stable today. No acute distress.  No issues overnight Stable for discharge.  Discharge Exam:    Vitals:   07/05/21 0802 07/05/21 1322 07/05/21 2057 07/06/21 0416  BP: (!) 174/114 (!) 145/104 (!) 149/103 (!) 153/92  Pulse: 92 100 91 79  Resp:  '18 19 18  '$ Temp:  98.6 F (37 C) 97.8 F (36.6 C) 98.2 F (36.8 C)  TempSrc:   Oral Oral  SpO2:  98% 97% 95%  Weight:      Height:        General: Pt lying comfortably in bed & appears in no obvious distress. Cardiovascular: S1 & S2 heard, RRR, S1/S2 +. No murmurs, rubs, gallops or clicks. No JVD or pedal edema. Respiratory: Clear to auscultation without wheezing, rhonchi or crackles. No increased work of breathing. Abdominal:  Non-distended, non-tender & soft. No organomegaly or masses appreciated. Normal bowel sounds heard. CNS: Alert and oriented. No  focal deficits. Extremities: no edema, no cyanosis Pressure Injury 06/28/21 Coccyx Mid Deep Tissue Pressure Injury - Purple or maroon localized area of discolored intact skin or blood-filled blister due to damage of underlying soft tissue from pressure and/or shear. (Active)  06/28/21 2030  Location: Coccyx  Location Orientation: Mid  Staging: Deep Tissue Pressure Injury - Purple or maroon localized area of discolored intact skin or blood-filled blister due to damage of underlying soft tissue from pressure and/or shear.  Wound Description (Comments):   Present on Admission: Yes      The results of significant diagnostics from this hospitalization (including imaging, microbiology, ancillary and laboratory) are listed below for reference.      Microbiology:   No results found for this or any previous visit (from the past 240 hour(s)).   Labs:   CBC: No results for input(s): WBC, NEUTROABS, HGB, HCT, MCV, PLT in the last 168 hours.  Basic Metabolic Panel: Recent Labs  Lab 07/05/21 0459 07/06/21 0527  NA 138 140  K 3.7 3.6  CL 108 110  CO2 21* 23  GLUCOSE 95 91  BUN 12 15  CREATININE 0.72 0.68  CALCIUM 8.2* 8.1*   Liver Function Tests: No results for input(s): AST, ALT, ALKPHOS, BILITOT, PROT, ALBUMIN in the last 168 hours. BNP (last 3 results) No results for input(s): BNP in the last 8760 hours. Cardiac Enzymes: No results for input(s): CKTOTAL, CKMB, CKMBINDEX, TROPONINI in the last 168 hours. CBG: No results for input(s): GLUCAP in the last 168 hours.  Hgb A1c No results for input(s): HGBA1C in the last 72 hours. Lipid Profile No results for input(s): CHOL, HDL, LDLCALC, TRIG, CHOLHDL, LDLDIRECT in the last 72 hours. Thyroid function studies No results for input(s): TSH, T4TOTAL, T3FREE, THYROIDAB in the last 72 hours.  Invalid input(s): FREET3 Anemia work up No results for input(s): VITAMINB12, FOLATE, FERRITIN, TIBC, IRON, RETICCTPCT in the last 72  hours. Urinalysis    Component Value Date/Time   COLORURINE YELLOW 01/28/2019 1522   APPEARANCEUR CLEAR 01/28/2019 1522   LABSPEC 1.038 (H) 01/28/2019 1522   PHURINE 5.0 01/28/2019 [place] 01/28/2019 1522   [place] 01/28/2019 1522   BILIRUBINUR  NEGATIVE 01/28/2019 1522   KETONESUR 5 (A) 01/28/2019 1522   PROTEINUR NEGATIVE 01/28/2019 1522   UROBILINOGEN 0.2 12/10/2010 1555   NITRITE NEGATIVE 01/28/2019 1522   LEUKOCYTESUR NEGATIVE 01/28/2019 1522   Pressure Injury 06/28/21 Coccyx Mid Deep Tissue Pressure Injury - Purple or maroon localized area of discolored intact skin or blood-filled blister due to damage of underlying soft tissue from pressure and/or shear. (Active)  06/28/21 2030  Location: Coccyx  Location Orientation: Mid  Staging: Deep Tissue Pressure Injury - Purple or maroon localized area of discolored intact skin or blood-filled blister due to damage of underlying soft tissue from pressure and/or shear.  Wound Description (Comments):   Present on Admission: Yes       Time coordinating discharge: Over 45 minutes  SIGNED: Deatra James, MD, FACP, [facility]. [facility],  Please use [email or web address] to Page If 7PM-7AM, please contact night-coverage [email or web address], Password [facility] 07/11/2021, 11:03 AM

## 2021-08-13 DEATH — deceased

## 2021-10-17 ENCOUNTER — Ambulatory Visit (INDEPENDENT_AMBULATORY_CARE_PROVIDER_SITE_OTHER): Payer: PPO | Admitting: Gastroenterology
